# Patient Record
Sex: Female | Born: 1970
Health system: Southern US, Community
[De-identification: ages and names within clinical notes are randomized; demographics above are authoritative.]

## PROBLEM LIST (undated history)

## (undated) DIAGNOSIS — D219 Benign neoplasm of connective and other soft tissue, unspecified: Secondary | ICD-10-CM

## (undated) DIAGNOSIS — D649 Anemia, unspecified: Secondary | ICD-10-CM

## (undated) DIAGNOSIS — F32A Depression, unspecified: Secondary | ICD-10-CM

## (undated) DIAGNOSIS — R51 Headache: Secondary | ICD-10-CM

## (undated) DIAGNOSIS — F419 Anxiety disorder, unspecified: Secondary | ICD-10-CM

## (undated) DIAGNOSIS — I10 Essential (primary) hypertension: Secondary | ICD-10-CM

## (undated) DIAGNOSIS — L509 Urticaria, unspecified: Secondary | ICD-10-CM

## (undated) DIAGNOSIS — K219 Gastro-esophageal reflux disease without esophagitis: Secondary | ICD-10-CM

## (undated) DIAGNOSIS — Z9889 Other specified postprocedural states: Secondary | ICD-10-CM

## (undated) DIAGNOSIS — F329 Major depressive disorder, single episode, unspecified: Secondary | ICD-10-CM

## (undated) DIAGNOSIS — T7840XA Allergy, unspecified, initial encounter: Secondary | ICD-10-CM

## (undated) HISTORY — DX: Allergy, unspecified, initial encounter: T78.40XA

## (undated) HISTORY — DX: Urticaria, unspecified: L50.9

## (undated) HISTORY — DX: Major depressive disorder, single episode, unspecified: F32.9

## (undated) HISTORY — PX: WISDOM TOOTH EXTRACTION: SHX21

## (undated) HISTORY — DX: Depression, unspecified: F32.A

## (undated) HISTORY — PX: ABDOMINAL HYSTERECTOMY: SHX81

## (undated) HISTORY — DX: Other specified postprocedural states: Z98.890

---

## 1990-05-11 DIAGNOSIS — Z9889 Other specified postprocedural states: Secondary | ICD-10-CM

## 1990-05-11 HISTORY — DX: Other specified postprocedural states: Z98.890

## 1995-05-12 HISTORY — PX: TUBAL LIGATION: SHX77

## 1996-05-11 HISTORY — PX: LEEP: SHX91

## 2000-08-01 ENCOUNTER — Emergency Department (HOSPITAL_COMMUNITY): Admission: EM | Admit: 2000-08-01 | Discharge: 2000-08-01 | Payer: Self-pay | Admitting: Emergency Medicine

## 2000-08-01 ENCOUNTER — Encounter: Payer: Self-pay | Admitting: Emergency Medicine

## 2000-09-16 ENCOUNTER — Emergency Department (HOSPITAL_COMMUNITY): Admission: EM | Admit: 2000-09-16 | Discharge: 2000-09-16 | Payer: Self-pay | Admitting: Emergency Medicine

## 2000-09-16 ENCOUNTER — Encounter: Payer: Self-pay | Admitting: Emergency Medicine

## 2001-03-01 ENCOUNTER — Encounter: Payer: Self-pay | Admitting: Obstetrics and Gynecology

## 2001-03-01 ENCOUNTER — Ambulatory Visit (HOSPITAL_COMMUNITY): Admission: RE | Admit: 2001-03-01 | Discharge: 2001-03-01 | Payer: Self-pay | Admitting: Obstetrics and Gynecology

## 2001-07-19 ENCOUNTER — Other Ambulatory Visit: Admission: RE | Admit: 2001-07-19 | Discharge: 2001-07-19 | Payer: Self-pay | Admitting: Obstetrics and Gynecology

## 2004-03-17 ENCOUNTER — Other Ambulatory Visit: Admission: RE | Admit: 2004-03-17 | Discharge: 2004-03-17 | Payer: Self-pay | Admitting: Obstetrics and Gynecology

## 2006-11-26 ENCOUNTER — Inpatient Hospital Stay (HOSPITAL_COMMUNITY): Admission: AD | Admit: 2006-11-26 | Discharge: 2006-11-26 | Payer: Self-pay | Admitting: Gynecology

## 2006-12-08 ENCOUNTER — Emergency Department (HOSPITAL_COMMUNITY): Admission: EM | Admit: 2006-12-08 | Discharge: 2006-12-08 | Payer: Self-pay | Admitting: Emergency Medicine

## 2010-05-11 DIAGNOSIS — I10 Essential (primary) hypertension: Secondary | ICD-10-CM

## 2010-05-11 HISTORY — DX: Essential (primary) hypertension: I10

## 2011-02-23 LAB — WET PREP, GENITAL
Trich, Wet Prep: NONE SEEN
Yeast Wet Prep HPF POC: NONE SEEN

## 2011-02-23 LAB — URINALYSIS, ROUTINE W REFLEX MICROSCOPIC
Bilirubin Urine: NEGATIVE
Glucose, UA: NEGATIVE
Hgb urine dipstick: NEGATIVE
Ketones, ur: NEGATIVE
Nitrite: NEGATIVE
Protein, ur: NEGATIVE
Specific Gravity, Urine: >1.03 — ABNORMAL HIGH
Urobilinogen, UA: 0.2
pH: 5.5

## 2011-02-23 LAB — GC/CHLAMYDIA PROBE AMP, GENITAL
Chlamydia, DNA Probe: NEGATIVE
GC Probe Amp, Genital: NEGATIVE

## 2011-09-09 LAB — HM PAP SMEAR

## 2011-10-29 ENCOUNTER — Ambulatory Visit (INDEPENDENT_AMBULATORY_CARE_PROVIDER_SITE_OTHER): Payer: 59 | Admitting: Obstetrics & Gynecology

## 2011-10-29 ENCOUNTER — Other Ambulatory Visit (HOSPITAL_COMMUNITY)
Admission: RE | Admit: 2011-10-29 | Discharge: 2011-10-29 | Disposition: A | Discharge status: Home / Self Care | Payer: 59 | Source: Ambulatory Visit | Attending: Obstetrics & Gynecology | Admitting: Obstetrics & Gynecology

## 2011-10-29 ENCOUNTER — Encounter: Payer: Self-pay | Admitting: Obstetrics & Gynecology

## 2011-10-29 VITALS — BP 137/80 | HR 86 | Temp 97.0°F | Ht 64.0 in | Wt 188.9 lb

## 2011-10-29 DIAGNOSIS — N938 Other specified abnormal uterine and vaginal bleeding: Secondary | ICD-10-CM | POA: Insufficient documentation

## 2011-10-29 DIAGNOSIS — Z01419 Encounter for gynecological examination (general) (routine) without abnormal findings: Secondary | ICD-10-CM

## 2011-10-29 DIAGNOSIS — N920 Excessive and frequent menstruation with regular cycle: Secondary | ICD-10-CM | POA: Insufficient documentation

## 2011-10-29 DIAGNOSIS — A499 Bacterial infection, unspecified: Secondary | ICD-10-CM

## 2011-10-29 DIAGNOSIS — D259 Leiomyoma of uterus, unspecified: Secondary | ICD-10-CM

## 2011-10-29 DIAGNOSIS — I10 Essential (primary) hypertension: Secondary | ICD-10-CM | POA: Insufficient documentation

## 2011-10-29 DIAGNOSIS — N949 Unspecified condition associated with female genital organs and menstrual cycle: Secondary | ICD-10-CM | POA: Insufficient documentation

## 2011-10-29 DIAGNOSIS — IMO0002 Reserved for concepts with insufficient information to code with codable children: Secondary | ICD-10-CM

## 2011-10-29 DIAGNOSIS — D219 Benign neoplasm of connective and other soft tissue, unspecified: Secondary | ICD-10-CM | POA: Insufficient documentation

## 2011-10-29 DIAGNOSIS — N760 Acute vaginitis: Secondary | ICD-10-CM

## 2011-10-29 DIAGNOSIS — B9689 Other specified bacterial agents as the cause of diseases classified elsewhere: Secondary | ICD-10-CM

## 2011-10-29 MED ORDER — IBUPROFEN 200 MG PO TABS
800.0000 mg | ORAL_TABLET | Freq: Once | ORAL | Status: AC
  Administered 2011-10-29: 800 mg via ORAL

## 2011-10-29 MED ORDER — DICLOFENAC SODIUM 75 MG PO TBEC: 75.0000 mg | DELAYED_RELEASE_TABLET | Freq: Two times a day (BID) | ORAL | Status: DC

## 2011-10-29 MED ORDER — MEDROXYPROGESTERONE ACETATE 10 MG PO TABS: 20.0000 mg | ORAL_TABLET | Freq: Every day | ORAL | Status: DC

## 2011-10-29 NOTE — Progress Notes (Signed)
History:  41 y.o. Abigail [name] (SVD x 2) here today for evaluation and management of menorrhagia and fibroids. She was referred from [facility] and had an annual exam there on 09/25/11.  She had a normal pap smear, exam revealed a 12-14 sized enlarged uterus.  Hgb was noted to be 8.3.  Menses cone q 28-30 days, flow x 5 days, heavier the first two days requiring changing super pad/tampon every 1.5 -2 hours.  Associated with moderate cramping, bloating, passage of large blood clots.  Patient also reports occasional dyspareunia. Patient reports that she has discussed HTA or other surgical management in the past.  History of BTL in 1997, LEEP in 1992 with normal subsequent pap smears.   The following portions of the patient's history were reviewed and updated as appropriate: allergies, current medications, past family history, past medical history, past social history, past surgical history and problem list.  Review of Systems:  Pertinent items are noted in HPI.  Objective:  Physical Exam Blood pressure 137/80, pulse 86, temperature 97 F (36.1 C), temperature source Oral, height 5\' Abigail"  (1.626 m), weight 188 lb 14.Abigail oz (85.684 kg), last menstrual period 10/10/2011. Gen: NAD Abd: Soft,obese, nontender and nondistended Pelvic: Normal appearing external genitalia; normal appearing vaginal mucosa and cervix.  Normal discharge.  16 week sized uterus, no other palpable masses, no uterine or adnexal tenderness  ENDOMETRIAL BIOPSY     The indications for endometrial biopsy were reviewed.   Risks of the biopsy including cramping, bleeding, infection, uterine perforation, inadequate specimen and need for additional procedures  were discussed. The patient states she understands and agrees to undergo procedure today. Consent was signed. Time out was performed. Urine HCG was negative. A sterile speculum was placed in the patient's vagina and the cervix was prepped with Betadine. A single-toothed  tenaculum was placed on the anterior lip of the cervix to stabilize it. The 3 mm pipelle was introduced into the endometrial cavity with difficulty to a depth of 6 cm, unable to pass pipelle further due to resistance from intrauterine obstruction/lesion.  Scant amount of tissue was obtained after 3 passes and sent to pathology. The instruments were removed from the patient's vagina. Minimal bleeding from the cervix was noted. The patient tolerated the procedure well. Routine post-procedure instructions were given to the patient. The patient will follow up to review the results and for further management.    Assessment & Plan:  Menorrhagia and fibroids, s/p endometrial biopsy which is likely inadequate. Will follow up biopsy results, pelvic ultrasound ordered.  Will continue iron therapy, Megace and Diclofenac ordered.When patient returns, will discuss results and further evaluation.  Not a candidate for ablation; if she want surgery, may need a hysterectomy.  If she wants to avoid surgery, will discuss Depo Lupron or oral progesterone, or other IR procedures for which she may be referred to tertiary institutions.

## 2011-10-29 NOTE — Patient Instructions (Signed)

## 2011-10-29 NOTE — Addendum Note (Signed)
Addended by: Jennette Kettle on: 10/29/2011 02:46 PM   Modules accepted: Orders

## 2011-11-04 ENCOUNTER — Ambulatory Visit (HOSPITAL_COMMUNITY)
Admission: RE | Admit: 2011-11-04 | Discharge: 2011-11-04 | Disposition: A | Discharge status: Home / Self Care | Payer: 59 | Source: Ambulatory Visit | Attending: Obstetrics & Gynecology | Admitting: Obstetrics & Gynecology

## 2011-11-04 DIAGNOSIS — D250 Submucous leiomyoma of uterus: Secondary | ICD-10-CM | POA: Insufficient documentation

## 2011-11-04 DIAGNOSIS — N920 Excessive and frequent menstruation with regular cycle: Secondary | ICD-10-CM

## 2011-11-04 DIAGNOSIS — N949 Unspecified condition associated with female genital organs and menstrual cycle: Secondary | ICD-10-CM | POA: Insufficient documentation

## 2011-11-04 DIAGNOSIS — D219 Benign neoplasm of connective and other soft tissue, unspecified: Secondary | ICD-10-CM

## 2011-11-04 DIAGNOSIS — D252 Subserosal leiomyoma of uterus: Secondary | ICD-10-CM | POA: Insufficient documentation

## 2011-11-05 ENCOUNTER — Telehealth: Payer: Self-pay | Admitting: *Deleted

## 2011-11-05 NOTE — Telephone Encounter (Signed)
Abigail [name] called and left a message stating she had a question about the provera the doctor put her on," how am I supposed to take it?". Called Abigail [name] back and verified she is supposed to take 2 tablets by mouth daily and has 2 refills- hopefully this will help control her bleeding. Verified patient is aware of her follow up appt 7/12 and she can report to doctor how her bleeding is doing and if she still wants her to continue this dose. Patient voices understanding.

## 2011-11-10 ENCOUNTER — Telehealth: Payer: Self-pay | Admitting: *Deleted

## 2011-11-10 NOTE — Telephone Encounter (Signed)
Pt left message stating that she was in office and had Bx about 2 wks ago. She is still having problems- pain and bleeding. Her cycle started on 11/04/11. She has questions.

## 2011-11-10 NOTE — Telephone Encounter (Signed)
Called pt and discussed her concerns.  She states that she has been taking the diclofenac for pain but does not always take it twice daily- if she only takes it once, the pain returns. I advised pt to take the medication twice daily until she returns for her next appt on 7/12.  I explained that she needs to keep the level of medication constant in her system.  Pt also expressed concern that her period started 6/26 and has not stopped. The bleeding is not heavy and is sometimes bright red and @ other times it is dark.  I explained that this is not a problem @ this time. She should keep track of the bleeding pattern and discuss further @ her next visit. I advised pt to go to MAU of her bleeding becomes >1 pad/hr x3 consecutive hours. Pt confirmed that she is taking her iron as prescribed. Pt verbalized understanding of all instructions and advice.

## 2011-11-20 ENCOUNTER — Ambulatory Visit (INDEPENDENT_AMBULATORY_CARE_PROVIDER_SITE_OTHER): Payer: 59 | Admitting: Obstetrics & Gynecology

## 2011-11-20 ENCOUNTER — Encounter: Payer: Self-pay | Admitting: Obstetrics & Gynecology

## 2011-11-20 VITALS — BP 139/88 | HR 83 | Temp 98.3°F | Ht 64.0 in | Wt 188.1 lb

## 2011-11-20 DIAGNOSIS — D259 Leiomyoma of uterus, unspecified: Secondary | ICD-10-CM

## 2011-11-20 DIAGNOSIS — Z01818 Encounter for other preprocedural examination: Secondary | ICD-10-CM

## 2011-11-20 DIAGNOSIS — D219 Benign neoplasm of connective and other soft tissue, unspecified: Secondary | ICD-10-CM

## 2011-11-20 DIAGNOSIS — N920 Excessive and frequent menstruation with regular cycle: Secondary | ICD-10-CM

## 2011-11-20 NOTE — Progress Notes (Signed)
History:   41 y.o. [id number] (SVD x 2) here today for management of menorrhagia and fibroids. She was referred from [facility] and had an annual exam there on 09/25/11. She had a normal pap smear, exam revealed a 12-14 sized enlarged uterus. Hgb was noted to be 8.3. Menses cone q 28-30 days, flow x 5 days, heavier the first two days requiring changing super pad/tampon every 1.5 -2 hours. Associated with moderate cramping, bloating, passage of large blood clots. Patient also reports occasional dyspareunia. Patient reports that she has discussed surgical management in the past. History of BTL in 1997, LEEP in 1992 with normal subsequent pap smears.  Minimal bleeding on Provera since it was prescribed last visit. Patient desires hysterectomy.  The following portions of the patient's history were reviewed and updated as appropriate: allergies, current medications, past family history, past medical history, past social history, past surgical history and problem list.   Review of Systems:   Pertinent items are noted in HPI.   Objective:   Physical Exam  Blood pressure 139/88, pulse 83, temperature 98.3 F (36.8 C), temperature source Oral, height 5\' 4"  (1.626 m), weight 188 lb 1.6 oz (85.322 kg), last menstrual period 10/10/2011. Gen: NAD  Abd: Soft,obese, nontender and nondistended  Pelvic: Deferred  10/29/2011 Endometrium, biopsy - BENIGN POLYPOID SECRETORY PHASE ENDOMETRIUM. - NEGATIVE FOR HYPERPLASIA OR MALIGNANCY. - POLYPOID BENIGN ENDOCERVICAL MUCOSA. - SCANT DETACHED FRAGMENTS OF SQUAMOUS EPITHELIUM; NEGATIVE FOR INTRAEPITHELIAL MALIGNANCY. - FRAGMENTS OF BENIGN SMOOTH MUSCLE.  11/04/2011  TRANSABDOMINAL AND TRANSVAGINAL ULTRASOUND OF PELVIS  Clinical Data: Menorrhagia.  Fibroids.  Pelvic pain.  LMP 11/04/2011     Comparison:  All the  Findings: Uterus:  15.7 x 3.5 x 6.2 cm.  At least four separate fibroids are seen which are measurable, and these range in size from 2.4 cm to  6.0 cm in maximum diameter.  The largest is subserosal in location, extending from the left upper uterine corpus, measuring 6.0 x 5.4 x 4.7 cm. Two smaller fibroids, which are located in the anterior upper uterine body measuring 4.0 cm and in the posterior corpus measuring 2.4 cm, have partial submucosal components which indent the endometrium.  Endometrium: Double layer thickness measures 9 mm transvaginally. Normal trilayered appearance.  Right ovary: 3.1 x 1.8 x 2.7 cm.  Normal appearance.  Left ovary: 2.8 x 2.0 x 2.0 cm.  Normal appearance.  Other Findings:  No free fluid  IMPRESSION:  1.  Multiple uterine fibroids measuring up to 6 cm in diameter.  At least two fibroids have partial submucosal components.  Pelvic MRI without and with contrast may be helpful for surgical planning purposes. 2.  Normal ovaries.  No adnexal mass or free fluid identified.  Original Report Authenticated By: Danae Orleans, M.D.   Assessment & Plan:   Menorrhagia and fibroids, benign endometrial biopsy.  Patient desires definitive management with hysterectomy.  I proposed doing a robotic-assisted total hysterectomy (RATH) and prophylactic bilateral salpingectomy.  No indication for oophorectomy. Surgical menopause could have major health risks such as loss of ovarian hormones that could lead to vasomotor symptoms and osteoporosis, increased risk of cardiovascular disease, and procedure-related complications.  However, patient was counseled regarding a prophylactic bilateral salpingectomy given that a growing body of knowledge reveals that the majority of cases of high grade serous "ovarian" cancer actually are fallopian tube cancers.The precursor lesions are though to arise from the fimbriated end of the fallopian tube and the cancer can have metastases from this  primary lesion; and removal of fallopian tubes do not result in any known hormonal imbalance.     Patient does not want robotic assisted surgery; wants traditional open  supracervical hysterectomy and prophylactic bilateral salpingectomy.  I tried to convince her to choose the minimally invasive option, she was not interested. The risks of surgery were discussed in detail with the patient including but not limited to: bleeding which may require transfusion or reoperation; infection which may require antibiotics; injury to bowel, bladder, ureters or other surrounding organs; need for additional procedures including laparotomy; thromboembolic phenomenon, incisional problems and other postoperative/anesthesia complications.  Patient was also advised that she will remain in house for 1-2 days; and expected recovery time after a hysterectomy is 6-8 weeks.  Likelihood of success in alleviating the patient's symptoms was discussed. Routine postoperative instructions will be reviewed with the patient and her family in detail after surgery.  She was told that she will be contacted by our surgical scheduler regarding the time and date of her surgery; routine preoperative instructions of having nothing to eat or drink after midnight on the day prior to surgery and also coming to the hospital 1 1/2 hours prior to her time of surgery were also emphasized.  She was told she may be called for a preoperative appointment about a week prior to surgery and will be given further preoperative instructions at that visit. Patient also declined Depo Lupron.  In the meantime, she will continue iron therapy, Megace and Diclofenac; bleeding precautions were reviewed. Printed patient education handouts about the procedure was given to the patient to review at home.

## 2011-11-20 NOTE — Patient Instructions (Signed)
Hysterectomy Information  A hysterectomy is a procedure where your uterus is surgically removed. It will no longer be possible to have menstrual periods or to become pregnant. The tubes and ovaries can be removed (bilateral salpingo-oopherectomy) during this surgery as well.  REASONS FOR A HYSTERECTOMY  Persistent, abnormal bleeding.   Lasting (chronic) pelvic pain or infection.   The lining of the uterus (endometrium) starts growing outside the uterus (endometriosis).   The endometrium starts growing in the muscle of the uterus (adenomyosis).   The uterus falls down into the vagina (pelvic organ prolapse).   Symptomatic uterine fibroids.   Precancerous cells.   Cervical cancer or uterine cancer.  TYPES OF HYSTERECTOMIES  Supracervical hysterectomy. This type removes the top part of the uterus, but not the cervix.   Total hysterectomy. This type removes the uterus and cervix.   Radical hysterectomy. This type removes the uterus, cervix, and the fibrous tissue that holds the uterus in place in the pelvis (parametrium).  WAYS A HYSTERECTOMY CAN BE PERFORMED  Abdominal hysterectomy. A large surgical cut (incision) is made in the abdomen. The uterus is removed through this incision.   Vaginal hysterectomy. An incision is made in the vagina. The uterus is removed through this incision. There are no abdominal incisions.   Conventional laparoscopic hysterectomy. A thin, lighted tube with a camera (laparoscope) is inserted into 3 or 4 small incisions in the abdomen. The uterus is cut into small pieces. The small pieces are removed through the incisions, or they are removed through the vagina.   Laparoscopic assisted vaginal hysterectomy (LAVH). Three or four small incisions are made in the abdomen. Part of the surgery is performed laparoscopically and part vaginally. The uterus is removed through the vagina.   Robot-assisted laparoscopic hysterectomy. A laparoscope is inserted into 3 or 4  small incisions in the abdomen. A computer-controlled device is used to give the surgeon a 3D image. This allows for more precise movements of surgical instruments. The uterus is cut into small pieces and removed through the incisions or removed through the vagina.  RISKS OF HYSTERECTOMY   Bleeding and risk of blood transfusion. Tell your caregiver if you do not want to receive any blood products.   Blood clots in the legs or lung.   Infection.   Injury to surrounding organs.   Anesthesia problems or side effects.   Conversion to an abdominal hysterectomy.  WHAT TO EXPECT AFTER A HYSTERECTOMY  You will be given pain medicine.   You will need to have someone with you for the first 3 to 5 days after you go home.   You will need to follow up with your surgeon in 2 to 4 weeks after surgery to evaluate your progress.   You may have early menopause symptoms like hot flashes, night sweats, and insomnia.   If you had a hysterectomy for a problem that was not a cancer or a condition that could lead to cancer, then you no longer need Pap tests. However, even if you no longer need a Pap test, a regular exam is a good idea to make sure no other problems are starting.  Document Released: 10/21/2000 Document Revised: 04/16/2011 Document Reviewed: 12/06/2010 [facility] Patient Information 2012 [place], [place].  Supracervical Hysterectomy A supracervical hysterectomy is minimally invasive surgery to remove the top part of the uterus, but not the cervix. This surgery can be performed by making a large cut (incision) in the abdomen.  Your fallopian tubes and ovaries can be  removed (bilateral salpingo-oopherectomy) during this surgery as well.  You will not have menstrual periods or be able to get pregnant after having this surgery.  Benefits of surgery include:  Less pain.   Less risk of blood loss.   Less risk of infection.   Quicker return to normal activities.   Usually a 2 night stay in the  hospital.   Overall patient satisfaction.  LET YOUR CAREGIVER KNOW ABOUT:  Any history of abnormal Pap tests.   Allergies to food or medicine.   Medicines taken, including vitamins, herbs, eyedrops, over-the-counter medicines, and creams.   Use of steroids (by mouth or creams).   Previous problems with anesthetics or numbing medicines.   History of bleeding problems or blood clots.   Previous surgery.   Other health problems, including diabetes and kidney problems.   Any infections or colds you may have developed.   Symptoms of irregular or heavy periods, weight loss, or urinary or bowel changes.  RISKS AND COMPLICATIONS   Bleeding.   Blood clots in the legs or lung.   Infection.   Injury to surrounding organs.   Problems with anesthesia.   Risk of conversion to an open abdominal incision.   Early menopause symptoms (hot flashes, night sweats, insomnia).   Additional surgery later to remove the cervix if you have problems with the cervix.  BEFORE THE PROCEDURE  Ask your caregiver about changing or stopping your regular medicines.   Do not take aspirin or blood thinners (anticoagulants) for 1 week before the surgery, or as told by your caregiver.   Do not eat or drink anything for 8 hours before the surgery, or as told by your caregiver.   Quit smoking if you smoke.   Arrange for a ride home after surgery and for someone to help you at home during recovery.  PROCEDURE   You will be given an antibiotic medicine.   An intravenous (IV) line will be placed in your arm. You will be given medicine to make you sleep (general anesthetic).   The uterus will be removed through the incision.   Your incisions will be closed.  AFTER THE PROCEDURE    You will be taken to the recovery area where a nurse will watch and check your progress. Once you are awake, stable, and taking fluids well, without other problems, you will return to your room.   You will be given pain  medicine while you are in the hospital and for when you go home.   Try to have someone with you for the first 3 to 5 days after you go home.   Follow up with your surgeon in 4 weeks after surgery to evaluate your progress.  Document Released: 10/14/2007 Document Revised: 04/16/2011 Document Reviewed: 12/12/2010 [facility] Patient Information 2012 [place], [place].

## 2011-12-02 ENCOUNTER — Telehealth: Payer: Self-pay | Admitting: *Deleted

## 2011-12-02 NOTE — Telephone Encounter (Signed)
Pt left message requesting information about her surgery date- she has not received a letter.  I called pt this morning and informed her that the procedure has been scheduled on 01/18/12 @ 1245. Her letter was sent out on 11/27/11 and should be arriving this week. Pt voiced understanding.

## 2011-12-15 ENCOUNTER — Telehealth: Payer: Self-pay | Admitting: Medical

## 2011-12-15 NOTE — Telephone Encounter (Signed)
Pt called stating that she had been put on prometrium approximately 1 1/2 months ago. She has since had cramping without a cycle and wants to be sure that the doctor knows that she hasn't had a cycle and would like confirmation that this is ok. She wants to be sure this is not a reason for concern.

## 2011-12-21 NOTE — Telephone Encounter (Signed)
Discussed with Dr. Erin Fulling. Pt is to expect some cramping without a cycle while on prometrium. She was advised that this may lessen the longer she is on the medication and also that some spotting could occur. The patient voiced understanding and did not have any further questions at this time.

## 2012-01-05 ENCOUNTER — Encounter (HOSPITAL_COMMUNITY): Payer: Self-pay | Admitting: Pharmacist

## 2012-01-13 ENCOUNTER — Encounter (HOSPITAL_COMMUNITY): Payer: Self-pay

## 2012-01-13 ENCOUNTER — Encounter (HOSPITAL_COMMUNITY)
Admission: RE | Admit: 2012-01-13 | Discharge: 2012-01-13 | Disposition: A | Discharge status: Home / Self Care | Payer: 59 | Source: Ambulatory Visit | Attending: Obstetrics & Gynecology | Admitting: Obstetrics & Gynecology

## 2012-01-13 ENCOUNTER — Telehealth: Payer: Self-pay | Admitting: Medical

## 2012-01-13 HISTORY — DX: Benign neoplasm of connective and other soft tissue, unspecified: D21.9

## 2012-01-13 HISTORY — DX: Anxiety disorder, unspecified: F41.9

## 2012-01-13 HISTORY — DX: Headache: R51

## 2012-01-13 HISTORY — DX: Anemia, unspecified: D64.9

## 2012-01-13 HISTORY — DX: Gastro-esophageal reflux disease without esophagitis: K21.9

## 2012-01-13 HISTORY — DX: Essential (primary) hypertension: I10

## 2012-01-13 LAB — CBC
HCT: 40.7 % (ref 36.0–46.0)
Hemoglobin: 12.5 g/dL (ref 12.0–15.0)
WBC: 5.9 10*3/uL (ref 4.0–10.5)

## 2012-01-13 LAB — COMPREHENSIVE METABOLIC PANEL
Albumin: 3.8 g/dL (ref 3.5–5.2)
Alkaline Phosphatase: 54 U/L (ref 39–117)
BUN: 11 mg/dL (ref 6–23)
Chloride: 104 mEq/L (ref 96–112)
GFR calc Af Amer: >90 mL/min (ref >90)
Glucose, Bld: 86 mg/dL (ref 70–99)
Potassium: 3.5 mEq/L (ref 3.5–5.1)
Total Bilirubin: 0.1 mg/dL — ABNORMAL LOW (ref 0.3–1.2)

## 2012-01-13 LAB — SURGICAL PCR SCREEN: Staphylococcus aureus: NEGATIVE

## 2012-01-13 NOTE — Telephone Encounter (Signed)
Patient called stating that she has surgery scheduled for 01/18/12 and was sent pre-op information. Pre-op information did not include what needs to be completed before surgery and patient has been unable to reach anyone at the number provided on the surgical paperwork. Patient needs to know how to proceed so that pre-op is completed and she can still have surgery on 01/18/12.

## 2012-01-13 NOTE — Patient Instructions (Addendum)
   Your procedure is scheduled on: Monday September 9th  Enter through the Main Entrance of [facility] at: 11:30am Pick up the phone at the desk and dial [phone] and inform us of your arrival.  Please call this number if you have any problems the morning of surgery: [phone]  Remember: Do not eat food after midnight: Sunday You may have clear liquids until 9am Monday then nothing Please take your omeprazole as ordered evening prior to surgery, take your blood pressure medicine morning of surgery.   Do not wear jewelry, make-up, or FINGER nail polish No metal in your hair or on your body. Do not wear lotions, powders, perfumes or deodorant. Do not shave 48 hours prior to surgery. Do not bring valuables to the hospital.  Leave suitcase in the car. After Surgery it may be brought to your room. For patients being admitted to the hospital, checkout time is 11:00am the day of discharge.     Remember to use your hibiclens as instructed.Please shower with 1/2 bottle the evening before your surgery and the other 1/2 bottle the morning of surgery. Neck down avoiding private area.

## 2012-01-13 NOTE — Telephone Encounter (Signed)
Received a call from pre-op. Patient has appointment today at 2:15 pm for pre-op. I called and informed the patient of this appointment. She plans to keep it. She voiced no other questions or concerns.

## 2012-01-13 NOTE — Telephone Encounter (Signed)
Called patient and let her know that I am working on getting the answers for her for pre-op instructions. I have LM for Abigail [name] in pre-op scheduling and will call her back later today when I have more information. The patient voiced understanding.

## 2012-01-13 NOTE — Telephone Encounter (Signed)
Spoke with [facility], she said that the pre-op scheduler usually calls patients about a week in advance. I was given the extension for pre-op ([phone]) and LM at that number to call the clinic to discuss this patient and how I should advise her to proceed with pre-op appointment.

## 2012-01-17 MED ORDER — DEXTROSE 5 % IV SOLN
3.0000 g | INTRAVENOUS | Status: AC
  Administered 2012-01-18: 3 g via INTRAVENOUS
  Filled 2012-01-17: qty 3000

## 2012-01-18 ENCOUNTER — Encounter (HOSPITAL_COMMUNITY)
Admission: RE | Disposition: A | Discharge status: Home / Self Care | Payer: Self-pay | Source: Ambulatory Visit | Attending: Obstetrics & Gynecology

## 2012-01-18 ENCOUNTER — Encounter (HOSPITAL_COMMUNITY): Payer: Self-pay | Admitting: Anesthesiology

## 2012-01-18 ENCOUNTER — Encounter (HOSPITAL_COMMUNITY): Payer: Self-pay | Admitting: *Deleted

## 2012-01-18 ENCOUNTER — Inpatient Hospital Stay (HOSPITAL_COMMUNITY): Payer: 59 | Admitting: Anesthesiology

## 2012-01-18 ENCOUNTER — Inpatient Hospital Stay (HOSPITAL_COMMUNITY)
Admission: RE | Admit: 2012-01-18 | Discharge: 2012-01-20 | DRG: 743 | Disposition: A | Discharge status: Home / Self Care | Payer: 59 | Source: Ambulatory Visit | Attending: Obstetrics & Gynecology | Admitting: Obstetrics & Gynecology

## 2012-01-18 DIAGNOSIS — K219 Gastro-esophageal reflux disease without esophagitis: Secondary | ICD-10-CM | POA: Diagnosis present

## 2012-01-18 DIAGNOSIS — N920 Excessive and frequent menstruation with regular cycle: Secondary | ICD-10-CM | POA: Diagnosis present

## 2012-01-18 DIAGNOSIS — I10 Essential (primary) hypertension: Secondary | ICD-10-CM

## 2012-01-18 DIAGNOSIS — D250 Submucous leiomyoma of uterus: Secondary | ICD-10-CM | POA: Diagnosis present

## 2012-01-18 DIAGNOSIS — D259 Leiomyoma of uterus, unspecified: Secondary | ICD-10-CM

## 2012-01-18 DIAGNOSIS — Z90711 Acquired absence of uterus with remaining cervical stump: Secondary | ICD-10-CM | POA: Insufficient documentation

## 2012-01-18 DIAGNOSIS — D252 Subserosal leiomyoma of uterus: Secondary | ICD-10-CM | POA: Diagnosis present

## 2012-01-18 DIAGNOSIS — N8300 Follicular cyst of ovary, unspecified side: Secondary | ICD-10-CM | POA: Diagnosis present

## 2012-01-18 DIAGNOSIS — D649 Anemia, unspecified: Secondary | ICD-10-CM | POA: Diagnosis present

## 2012-01-18 DIAGNOSIS — D251 Intramural leiomyoma of uterus: Principal | ICD-10-CM | POA: Diagnosis present

## 2012-01-18 DIAGNOSIS — D219 Benign neoplasm of connective and other soft tissue, unspecified: Secondary | ICD-10-CM

## 2012-01-18 HISTORY — PX: SUPRACERVICAL ABDOMINAL HYSTERECTOMY: SHX5393

## 2012-01-18 LAB — ABO/RH: ABO/RH(D): A POS

## 2012-01-18 LAB — TYPE AND SCREEN
ABO/RH(D): A POS
Antibody Screen: NEGATIVE

## 2012-01-18 SURGERY — HYSTERECTOMY, SUPRACERVICAL, ABDOMINAL
Anesthesia: General | Site: Abdomen | Wound class: Clean Contaminated

## 2012-01-18 MED ORDER — DIPHENHYDRAMINE HCL 12.5 MG/5ML PO ELIX: 12.5000 mg | ORAL_SOLUTION | Freq: Four times a day (QID) | ORAL | Status: DC | PRN

## 2012-01-18 MED ORDER — PROPOFOL 10 MG/ML IV EMUL
INTRAVENOUS | Status: DC | PRN
  Administered 2012-01-18: 200 mg via INTRAVENOUS

## 2012-01-18 MED ORDER — LIDOCAINE HCL (CARDIAC) 20 MG/ML IV SOLN
INTRAVENOUS | Status: AC
  Filled 2012-01-18: qty 5

## 2012-01-18 MED ORDER — ONDANSETRON HCL 4 MG/2ML IJ SOLN
INTRAMUSCULAR | Status: AC
  Filled 2012-01-18: qty 2

## 2012-01-18 MED ORDER — ROCURONIUM BROMIDE 100 MG/10ML IV SOLN
INTRAVENOUS | Status: DC | PRN
Start: 2012-01-18 — End: 2012-01-18
  Administered 2012-01-18: 10 mg via INTRAVENOUS
  Administered 2012-01-18: 40 mg via INTRAVENOUS

## 2012-01-18 MED ORDER — 0.9 % SODIUM CHLORIDE (POUR BTL) OPTIME
TOPICAL | Status: DC | PRN
  Administered 2012-01-18 (×2): 1000 mL

## 2012-01-18 MED ORDER — NALOXONE HCL 0.4 MG/ML IJ SOLN: 0.4000 mg | INTRAMUSCULAR | Status: DC | PRN

## 2012-01-18 MED ORDER — GLYCOPYRROLATE 0.2 MG/ML IJ SOLN
INTRAMUSCULAR | Status: DC | PRN
  Administered 2012-01-18: 0.6 mg via INTRAVENOUS

## 2012-01-18 MED ORDER — MIDAZOLAM HCL 5 MG/5ML IJ SOLN
INTRAMUSCULAR | Status: DC | PRN
  Administered 2012-01-18: 2 mg via INTRAVENOUS

## 2012-01-18 MED ORDER — FENTANYL CITRATE 0.05 MG/ML IJ SOLN
INTRAMUSCULAR | Status: DC | PRN
  Administered 2012-01-18: 100 ug via INTRAVENOUS

## 2012-01-18 MED ORDER — LACTATED RINGERS IV SOLN
INTRAVENOUS | Status: DC
  Administered 2012-01-18 (×2): via INTRAVENOUS

## 2012-01-18 MED ORDER — SCOPOLAMINE 1 MG/3DAYS TD PT72
MEDICATED_PATCH | TRANSDERMAL | Status: AC
  Administered 2012-01-18: 1.5 mg via TRANSDERMAL
  Filled 2012-01-18: qty 1

## 2012-01-18 MED ORDER — DIPHENHYDRAMINE HCL 50 MG/ML IJ SOLN: 12.5000 mg | Freq: Four times a day (QID) | INTRAMUSCULAR | Status: DC | PRN

## 2012-01-18 MED ORDER — HYDROMORPHONE HCL PF 1 MG/ML IJ SOLN
INTRAMUSCULAR | Status: AC
  Filled 2012-01-18: qty 1

## 2012-01-18 MED ORDER — HYDROMORPHONE 0.3 MG/ML IV SOLN
INTRAVENOUS | Status: DC
  Administered 2012-01-18: 17:00:00 via INTRAVENOUS
  Administered 2012-01-18: 0.799 mg via INTRAVENOUS
  Administered 2012-01-19: 0.4 mg via INTRAVENOUS
  Administered 2012-01-19: 0.599 mg via INTRAVENOUS
  Filled 2012-01-18: qty 25

## 2012-01-18 MED ORDER — HYDROMORPHONE HCL PF 1 MG/ML IJ SOLN
INTRAMUSCULAR | Status: AC
  Administered 2012-01-18: 0.5 mg via INTRAVENOUS
  Filled 2012-01-18: qty 1

## 2012-01-18 MED ORDER — LACTATED RINGERS IV SOLN
INTRAVENOUS | Status: DC
  Administered 2012-01-18 – 2012-01-19 (×2): via INTRAVENOUS

## 2012-01-18 MED ORDER — ONDANSETRON HCL 4 MG PO TABS: 4.0000 mg | ORAL_TABLET | Freq: Four times a day (QID) | ORAL | Status: DC | PRN

## 2012-01-18 MED ORDER — LIDOCAINE HCL (CARDIAC) 20 MG/ML IV SOLN
INTRAVENOUS | Status: DC | PRN
  Administered 2012-01-18: 100 mg via INTRAVENOUS

## 2012-01-18 MED ORDER — NEOSTIGMINE METHYLSULFATE 1 MG/ML IJ SOLN
INTRAMUSCULAR | Status: AC
  Filled 2012-01-18: qty 10

## 2012-01-18 MED ORDER — DOCUSATE SODIUM 100 MG PO CAPS
100.0000 mg | ORAL_CAPSULE | Freq: Two times a day (BID) | ORAL | Status: DC
  Administered 2012-01-18 – 2012-01-19 (×3): 100 mg via ORAL
  Filled 2012-01-18 (×3): qty 1

## 2012-01-18 MED ORDER — ZOLPIDEM TARTRATE 5 MG PO TABS
5.0000 mg | ORAL_TABLET | Freq: Every evening | ORAL | Status: DC | PRN
  Administered 2012-01-18 – 2012-01-19 (×2): 5 mg via ORAL
  Filled 2012-01-18 (×2): qty 1

## 2012-01-18 MED ORDER — BUPIVACAINE HCL (PF) 0.25 % IJ SOLN
INTRAMUSCULAR | Status: AC
  Filled 2012-01-18: qty 30

## 2012-01-18 MED ORDER — HYDROMORPHONE HCL PF 1 MG/ML IJ SOLN
0.2500 mg | INTRAMUSCULAR | Status: DC | PRN
  Administered 2012-01-18 (×4): 0.5 mg via INTRAVENOUS

## 2012-01-18 MED ORDER — ONDANSETRON HCL 4 MG/2ML IJ SOLN: 4.0000 mg | Freq: Four times a day (QID) | INTRAMUSCULAR | Status: DC | PRN

## 2012-01-18 MED ORDER — HYDROMORPHONE HCL PF 1 MG/ML IJ SOLN
INTRAMUSCULAR | Status: DC | PRN
  Administered 2012-01-18: 1 mg via INTRAVENOUS

## 2012-01-18 MED ORDER — DEXAMETHASONE SODIUM PHOSPHATE 10 MG/ML IJ SOLN
INTRAMUSCULAR | Status: AC
  Filled 2012-01-18: qty 1

## 2012-01-18 MED ORDER — OXYCODONE-ACETAMINOPHEN 5-325 MG PO TABS: 1.0000 | ORAL_TABLET | ORAL | Status: DC | PRN

## 2012-01-18 MED ORDER — IBUPROFEN 600 MG PO TABS
600.0000 mg | ORAL_TABLET | Freq: Four times a day (QID) | ORAL | Status: DC | PRN
  Administered 2012-01-19 – 2012-01-20 (×4): 600 mg via ORAL
  Filled 2012-01-18 (×4): qty 1

## 2012-01-18 MED ORDER — MENTHOL 3 MG MT LOZG: 1.0000 | LOZENGE | OROMUCOSAL | Status: DC | PRN

## 2012-01-18 MED ORDER — SIMETHICONE 80 MG PO CHEW
80.0000 mg | CHEWABLE_TABLET | Freq: Four times a day (QID) | ORAL | Status: DC | PRN
  Administered 2012-01-19: 80 mg via ORAL

## 2012-01-18 MED ORDER — TRIAMTERENE-HCTZ 37.5-25 MG PO CAPS
1.0000 | ORAL_CAPSULE | Freq: Every day | ORAL | Status: DC
  Filled 2012-01-18 (×2): qty 1

## 2012-01-18 MED ORDER — SODIUM CHLORIDE 0.9 % IJ SOLN: 9.0000 mL | INTRAMUSCULAR | Status: DC | PRN

## 2012-01-18 MED ORDER — BUPIVACAINE HCL (PF) 0.25 % IJ SOLN
INTRAMUSCULAR | Status: DC | PRN
  Administered 2012-01-18: 30 mL

## 2012-01-18 MED ORDER — MIDAZOLAM HCL 2 MG/2ML IJ SOLN
INTRAMUSCULAR | Status: AC
  Filled 2012-01-18: qty 2

## 2012-01-18 MED ORDER — PANTOPRAZOLE SODIUM 40 MG PO TBEC
40.0000 mg | DELAYED_RELEASE_TABLET | Freq: Every day | ORAL | Status: DC
  Filled 2012-01-18 (×2): qty 1

## 2012-01-18 MED ORDER — MEPERIDINE HCL 25 MG/ML IJ SOLN: 6.2500 mg | INTRAMUSCULAR | Status: DC | PRN

## 2012-01-18 MED ORDER — GLYCOPYRROLATE 0.2 MG/ML IJ SOLN
INTRAMUSCULAR | Status: AC
  Filled 2012-01-18: qty 1

## 2012-01-18 MED ORDER — METOCLOPRAMIDE HCL 5 MG/ML IJ SOLN: 10.0000 mg | Freq: Once | INTRAMUSCULAR | Status: DC | PRN

## 2012-01-18 MED ORDER — KETOROLAC TROMETHAMINE 30 MG/ML IJ SOLN
INTRAMUSCULAR | Status: DC | PRN
  Administered 2012-01-18: 30 mg via INTRAVENOUS

## 2012-01-18 MED ORDER — NEOSTIGMINE METHYLSULFATE 1 MG/ML IJ SOLN
INTRAMUSCULAR | Status: DC | PRN
  Administered 2012-01-18: 3 mg via INTRAVENOUS

## 2012-01-18 MED ORDER — DEXAMETHASONE SODIUM PHOSPHATE 4 MG/ML IJ SOLN
INTRAMUSCULAR | Status: DC | PRN
  Administered 2012-01-18: 10 mg via INTRAVENOUS

## 2012-01-18 MED ORDER — ONDANSETRON HCL 4 MG/2ML IJ SOLN
INTRAMUSCULAR | Status: DC | PRN
  Administered 2012-01-18: 4 mg via INTRAVENOUS

## 2012-01-18 MED ORDER — FENTANYL CITRATE 0.05 MG/ML IJ SOLN
INTRAMUSCULAR | Status: AC
  Filled 2012-01-18: qty 5

## 2012-01-18 MED ORDER — SCOPOLAMINE 1 MG/3DAYS TD PT72
1.0000 | MEDICATED_PATCH | TRANSDERMAL | Status: DC
  Administered 2012-01-18: 1.5 mg via TRANSDERMAL

## 2012-01-18 MED ORDER — OMEPRAZOLE MAGNESIUM 20 MG PO TBEC: 20.0000 mg | DELAYED_RELEASE_TABLET | Freq: Every day | ORAL | Status: DC

## 2012-01-18 MED ORDER — PROPOFOL 10 MG/ML IV EMUL
INTRAVENOUS | Status: AC
  Filled 2012-01-18: qty 20

## 2012-01-18 MED ORDER — FLUTICASONE PROPIONATE 50 MCG/ACT NA SUSP
2.0000 | Freq: Every day | NASAL | Status: DC | PRN
  Filled 2012-01-18: qty 16

## 2012-01-18 MED ORDER — PANTOPRAZOLE SODIUM 40 MG PO TBEC: 40.0000 mg | DELAYED_RELEASE_TABLET | Freq: Every day | ORAL | Status: DC

## 2012-01-18 SURGICAL SUPPLY — 45 items
BARRIER ADHS 3X4 INTERCEED (GAUZE/BANDAGES/DRESSINGS) IMPLANT
BENZOIN TINCTURE PRP APPL 2/3 (GAUZE/BANDAGES/DRESSINGS) ×3 IMPLANT
CANISTER SUCTION 2500CC (MISCELLANEOUS) ×3 IMPLANT
CELLS DAT CNTRL 66122 CELL SVR (MISCELLANEOUS) IMPLANT
CHLORAPREP W/TINT 26ML (MISCELLANEOUS) ×3 IMPLANT
CLOTH BEACON ORANGE TIMEOUT ST (SAFETY) ×3 IMPLANT
CONT PATH 16OZ SNAP LID 3702 (MISCELLANEOUS) ×3 IMPLANT
DECANTER SPIKE VIAL GLASS SM (MISCELLANEOUS) IMPLANT
DRESSING TELFA 8X3 (GAUZE/BANDAGES/DRESSINGS) ×3 IMPLANT
GAUZE SPONGE 4X4 12PLY STRL LF (GAUZE/BANDAGES/DRESSINGS) ×3 IMPLANT
GAUZE SPONGE 4X4 16PLY XRAY LF (GAUZE/BANDAGES/DRESSINGS) ×3 IMPLANT
GLOVE BIO SURGEON STRL SZ7 (GLOVE) ×3 IMPLANT
GLOVE BIOGEL PI IND STRL 7.0 (GLOVE) ×4 IMPLANT
GLOVE BIOGEL PI INDICATOR 7.0 (GLOVE) ×2
GOWN PREVENTION PLUS LG XLONG (DISPOSABLE) ×9 IMPLANT
HEMOSTAT SURGICEL 4X8 (HEMOSTASIS) ×3 IMPLANT
NEEDLE HYPO 25X1 1.5 SAFETY (NEEDLE) ×3 IMPLANT
NS IRRIG 1000ML POUR BTL (IV SOLUTION) ×3 IMPLANT
PACK ABDOMINAL GYN (CUSTOM PROCEDURE TRAY) ×3 IMPLANT
PAD ABD 7.5X8 STRL (GAUZE/BANDAGES/DRESSINGS) ×3 IMPLANT
PAD OB MATERNITY 4.3X12.25 (PERSONAL CARE ITEMS) ×3 IMPLANT
RETRACTOR WND ALEXIS 25 LRG (MISCELLANEOUS) IMPLANT
RTRCTR WOUND ALEXIS 18CM MED (MISCELLANEOUS)
RTRCTR WOUND ALEXIS 25CM LRG (MISCELLANEOUS)
SPONGE GAUZE 4X4 12PLY (GAUZE/BANDAGES/DRESSINGS) ×3 IMPLANT
SPONGE LAP 18X18 X RAY DECT (DISPOSABLE) ×3 IMPLANT
STAPLER VISISTAT 35W (STAPLE) IMPLANT
STRIP CLOSURE SKIN 1/2X4 (GAUZE/BANDAGES/DRESSINGS) ×3 IMPLANT
SUT PDS AB 0 CT1 36 (SUTURE) ×3 IMPLANT
SUT PDS AB 0 CTX 60 (SUTURE) IMPLANT
SUT PLAIN 2 0 (SUTURE)
SUT PLAIN ABS 2-0 CT1 27XMFL (SUTURE) IMPLANT
SUT VIC AB 0 CT1 18XCR BRD8 (SUTURE) ×4 IMPLANT
SUT VIC AB 0 CT1 27 (SUTURE) ×3
SUT VIC AB 0 CT1 27XBRD ANBCTR (SUTURE) ×6 IMPLANT
SUT VIC AB 0 CT1 8-18 (SUTURE) ×2
SUT VIC AB 0 CTX 36 (SUTURE)
SUT VIC AB 0 CTX36XBRD ANBCTRL (SUTURE) IMPLANT
SUT VIC AB 4-0 KS 27 (SUTURE) ×3 IMPLANT
SUT VICRYL 0 TIES 12 18 (SUTURE) ×3 IMPLANT
SYR CONTROL 10ML LL (SYRINGE) ×3 IMPLANT
TAPE CLOTH SURG 4X10 WHT LF (GAUZE/BANDAGES/DRESSINGS) ×3 IMPLANT
TOWEL OR 17X24 6PK STRL BLUE (TOWEL DISPOSABLE) ×6 IMPLANT
TRAY FOLEY CATH 14FR (SET/KITS/TRAYS/PACK) ×3 IMPLANT
WATER STERILE IRR 1000ML POUR (IV SOLUTION) IMPLANT

## 2012-01-18 NOTE — Op Note (Signed)
Abigail [name] PROCEDURE DATE: 01/18/2012  PREOPERATIVE DIAGNOSIS:  Symptomatic fibroids, menorrhagia POSTOPERATIVE DIAGNOSIS:  Symptomatic fibroids, menorrhagia SURGEON:   Jaynie Collins, M.D. ASSISTANT: Elsie Lincoln, M.D. And Anselm Jungling, PA-S OPERATION:  Supracervical abdominal hysterectomy, Left Salpingoohorectomy, Right Salpingectomy ANESTHESIA:  General endotracheal.  INDICATIONS: The patient is a 41 y.o. [id number]  with the aforementioned diagnoses who desires definitive surgical management. On the preoperative visit, the risks, benefits, indications, and alternatives of the procedure were reviewed with the patient.  On the day of surgery, the risks of surgery were again discussed with the patient including but not limited to: bleeding which may require transfusion or reoperation; infection which may require antibiotics; injury to bowel, bladder, ureters or other surrounding organs; need for additional procedures; thromboembolic phenomenon, incisional problems and other postoperative/anesthesia complications. Written informed consent was obtained.    OPERATIVE FINDINGS: A 16 week size fibroid uterus with normal tubes and ovaries bilaterally.  ESTIMATED BLOOD LOSS: 200 ml FLUIDS:  1500 ml of Lactated Ringers URINE OUTPUT:  250 ml of clear yellow urine. SPECIMENS:  Uterus,  bilateral fallopian tube segments (patient had a tubal ligation), left ovary sent to pathology COMPLICATIONS:  None immediate.  DESCRIPTION OF PROCEDURE:  The patient received intravenous antibiotics and had sequential compression devices applied to her lower extremities while in the preoperative area.   She was taken to the operating room and placed under general anesthesia without difficulty.The abdomen and perineum were prepped and draped in a sterile manner, and she was placed in a dorsal supine position.  A Foley catheter was inserted into the bladder and attached to constant drainage. After an adequate timeout was  performed, a Pfannensteil skin incision was made. This incision was taken down to the fascia using electrocautery with care given to maintain good hemostasis. The fascia was incised in the midline and the fascial incision was then extended bilaterally using electrocautery without difficulty. The fascia was then dissected off the underlying rectus muscles using blunt and sharp dissection. The rectus muscles were split bluntly in the midline and the peritoneum entered sharply without complication. This peritoneal incision was then extended superiorly and inferiorly with care given to prevent bowel or bladder injury. Attention was then turned to the pelvis. The bowel was packed away with moist laparotomy sponges. The uterus at this point was noted to be mobilized and was delivered up out of the abdomen. The round ligaments on each side were clamped, suture ligated with 0 Vicryl, and transected with electrocautery allowing entry into the broad ligament. Of note, all sutures used in this procedure are 0 Vicryl unless otherwise noted. The anterior and posterior leaves of the broad ligament were separated, and the ureters were inspected to be safely away from the area of dissection bilaterally.  Adnexae were clamped on the patient's right side, cut, and doubly suture ligated. This procedure was repeated in an identical fashion on the left site allowing for both adnexa to remain in place.  Kelly clamps were placed on the mesosalpinx of the right fallopian tube, and the fallopian tube was excised.  The pedicle was then secured with a free tie.  A similar process was carried out on the left side, allowing for bilateral salpingectomy.   A bladder flap was then created.  The bladder was then bluntly dissected off the lower uterine segment and cervix with good hemostasis noted. The uterine arteries were then skeletonized bilaterally and then clamped, cut, and doubly suture ligated with care given to prevent ureteral injury.  The  uterus was then amputated across the lower uterine segment leaving the cervix intact. The specimen was sent to pathology. The cervical canal was coagulated, and the anterior and posterior peritoneal edges were then reapproximated in the midline over the cervical stump without complication. The pelvis was irrigated and some areas of bleeding were controlled with stitches and coagulation.  The left ovarian pedicle was noted to have significant bleeding not alleviated with these methods so the left infundibulopelvic ligament was clamped, cut, and doubly suture ligated resulting in left oophorectomy.  Hemostasis was reconfirmed at all pedicles and along the pelvic sidewall.  The ureters were inspected and noted to be peristalsing bilaterally.  All laparotomy sponges and instruments were removed from the abdomen. The rectus muscles were approximated with interrupted stiches. The fascia was also closed in a running fashion with 0 PDS. The subcutaneous layer was reapproximated with 2-0 plain gut. The skin was closed with a 4-0 Vicryl subcuticular stitch. Sponge, lap, needle, and instrument counts were correct times two. The patient was taken to the recovery area awake, extubated and in stable condition.  Jaynie Collins, M.D. 01/18/2012 3:32 PM

## 2012-01-18 NOTE — H&P (Signed)
Preoperative History and Physical  Abigail [name] is a 41 y.o. [id number] here for surgical management of menorrhagia and fibroids.  Proposed surgery: Abdominal supracervical hysterectomy and prophylactic bilateral salpingectomy.  Past Medical History  Diagnosis Date  . S/P LEEP 1998  . Hypertension     maxide  . Headache   . Anxiety   . GERD (gastroesophageal reflux disease)     omeprazole  . Fibroids   . Anemia     iron supp  . PONV (postoperative nausea and vomiting)     Past Surgical History  Procedure Date  . Tubal ligation   . Leep 1998   POb/GynH:  SVD x 2. History of BTL in 1997, LEEP in 1992 with normal subsequent pap smears.  Medications:  Prescriptions prior to admission  Medication Sig Dispense Refill  . diclofenac (VOLTAREN) 75 MG EC tablet Take 1 tablet (75 mg total) by mouth 2 (two) times daily with a meal.  60 tablet  2  . fluticasone (FLONASE) 50 MCG/ACT nasal spray Place 2 sprays into the nose daily as needed. For allergies      . medroxyPROGESTERone (PROVERA) 10 MG tablet Take 2 tablets (20 mg total) by mouth daily.  60 tablet  2  . omeprazole (PRILOSEC OTC) 20 MG tablet Take 20 mg by mouth daily as needed. For heartburn      . triamterene-hydrochlorothiazide (MAXZIDE-25) 37.5-25 MG per tablet Take 1 tablet by mouth daily.         Allergies  Allergen Reactions  . Tylox (Oxycodone-Acetaminophen) Itching    Social History  Substance Use Topics  . Smoking status: Never Smoker   . Smokeless tobacco: Never Used  . Alcohol Use: 0.5 oz/week    1 drink(s) per week     occasionally       Family History  Problem Relation Age of Onset  . Hypertension Father   . Diabetes Sister    Review of Systems: Noncontributory  PHYSICAL EXAM: Blood pressure 138/86, pulse 95, temperature 98.2 F (36.8 C), temperature source Oral, resp. rate 18, SpO2 97.00%. General appearance - alert, well appearing, and in no distress Chest - clear to auscultation, no wheezes,  rales or rhonchi, symmetric air entry Heart - normal rate and regular rhythm Abdomen - soft, nontender, nondistended, no organomegaly, able to palpate fibroid uterus Neurological - alert, oriented, normal speech, no focal findings or movement disorder noted Ext - No cyanosis, clubbing or edema, 2+ distal pulses bilaterally  Labs: Recent Results (from the past 336 hour(s))  SURGICAL PCR SCREEN   Collection Time   01/13/12  2:38 PM      Component Value Range   MRSA, PCR NEGATIVE  NEGATIVE   Staphylococcus aureus NEGATIVE  NEGATIVE  CBC   Collection Time   01/13/12  3:00 PM      Component Value Range   WBC 5.9  4.0 - 10.5 K/uL   RBC 4.89  3.87 - 5.11 MIL/uL   Hemoglobin 12.5  12.0 - 15.0 g/dL   HCT 45.4  09.8 - 11.9 %   MCV 83.2  78.0 - 100.0 fL   MCH 25.6 (*) 26.0 - 34.0 pg   MCHC 30.7  30.0 - 36.0 g/dL   RDW 14.7 (*) 82.9 - 56.2 %   Platelets 348  150 - 400 K/uL  COMPREHENSIVE METABOLIC PANEL   Collection Time   01/13/12  3:00 PM      Component Value Range   Sodium 138  135 - 145 mEq/L  Potassium 3.5  3.5 - 5.1 mEq/L   Chloride 104  96 - 112 mEq/L   CO2 26  19 - 32 mEq/L   Glucose, Bld 86  70 - 99 mg/dL   BUN 11  6 - 23 mg/dL   Creatinine, Ser 1.61  0.50 - 1.10 mg/dL   Calcium 9.5  8.4 - 09.6 mg/dL   Total Protein 7.9  6.0 - 8.3 g/dL   Albumin 3.8  3.5 - 5.2 g/dL   AST 26  0 - 37 U/L   ALT 21  0 - 35 U/L   Alkaline Phosphatase 54  39 - 117 U/L   Total Bilirubin 0.1 (*) 0.3 - 1.2 mg/dL   GFR calc non Af Amer >90  >90 mL/min   GFR calc Af Amer >90  >90 mL/min   10/29/2011 Endometrium, biopsy  - BENIGN POLYPOID SECRETORY PHASE ENDOMETRIUM.  - NEGATIVE FOR HYPERPLASIA OR MALIGNANCY.  - POLYPOID BENIGN ENDOCERVICAL MUCOSA.  - SCANT DETACHED FRAGMENTS OF SQUAMOUS EPITHELIUM; NEGATIVE FOR INTRAEPITHELIAL MALIGNANCY.  - FRAGMENTS OF BENIGN SMOOTH MUSCLE.   11/04/2011 TRANSABDOMINAL AND TRANSVAGINAL ULTRASOUND OF PELVIS Clinical Data: Menorrhagia. Fibroids. Pelvic pain. LMP  11/04/2011 Comparison: All the Findings: Uterus: 15.7 x 3.5 x 6.2 cm. At least four separate fibroids are seen which are measurable, and these range in size from 2.4 cm to 6.0 cm in maximum diameter. The largest is subserosal in location, extending from the left upper uterine corpus, measuring 6.0 x 5.4 x 4.7 cm. Two smaller fibroids, which are located in the anterior upper uterine body measuring 4.0 cm and in the posterior corpus measuring 2.4 cm, have partial submucosal components which indent the endometrium. Endometrium: Double layer thickness measures 9 mm transvaginally. Normal trilayered appearance. Right ovary: 3.1 x 1.8 x 2.7 cm. Normal appearance. Left ovary: 2.8 x 2.0 x 2.0 cm. Normal appearance. Other Findings: No free fluid IMPRESSION: 1. Multiple uterine fibroids measuring up to 6 cm in diameter. At least two fibroids have partial submucosal components. Pelvic MRI without and with contrast may be helpful for surgical planning purposes. 2. Normal ovaries. No adnexal mass or free fluid identified. Original Report Authenticated By: Danae Orleans, M.D.    Assessment: Patient Active Problem List  Diagnosis  . Menorrhagia  . Fibroids  . HTN (hypertension)    Plan: Patient desires surgical management with abdominal supracervical hysterectomy and prophylactic bilateral salpingectomy.   The risks of surgery were discussed in detail with the patient including but not limited to: bleeding which may require transfusion or reoperation; infection which may require antibiotics; injury to bowel, bladder, ureters or other surrounding organs; need for additional procedures including laparotomy; thromboembolic phenomenon, incisional problems and other postoperative/anesthesia complications. Patient was also advised that she will remain in house for 2 nights; and expected recovery time after a hysterectomy is 6-8 weeks. Likelihood of success in alleviating the patient's symptoms was discussed. Routine  postoperative instructions will be reviewed with the patient and her family in detail after surgery.  The patient concurred with the proposed plan, giving informed written consent for the surgery.  Patient has been NPO since last night she will remain NPO for procedure.  Anesthesia and OR aware.  Preoperative prophylactic antibiotics and SCDs ordered on call to the OR.  To OR when ready.  Jaynie Collins, M.D. 01/18/2012 12:46 PM

## 2012-01-18 NOTE — Anesthesia Procedure Notes (Signed)
Date/Time: 01/18/2012 1:15 PM Performed by: Isabella Bowens R Pre-anesthesia Checklist: Patient identified, Emergency Drugs available, Suction available, Timeout performed and Patient being monitored Patient Re-evaluated:Patient Re-evaluated prior to inductionOxygen Delivery Method: Circle system utilized Preoxygenation: Pre-oxygenation with 100% oxygen Intubation Type: IV induction Ventilation: Mask ventilation without difficulty Laryngoscope Size: Mac and 3 Grade View: Grade II Tube type: Oral Tube size: 7.0 mm Number of attempts: 1 Airway Equipment and Method: Stylet Placement Confirmation: ETT inserted through vocal cords under direct vision,  positive ETCO2 and breath sounds checked- equal and bilateral Secured at: 21 cm Tube secured with: Tape Dental Injury: Teeth and Oropharynx as per pre-operative assessment  Difficulty Due To: Difficulty was unanticipated

## 2012-01-18 NOTE — Anesthesia Postprocedure Evaluation (Signed)
Anesthesia Post Note  Patient: Abigail [name]  Procedure(s) Performed: Procedure(s) (LRB): HYSTERECTOMY SUPRACERVICAL ABDOMINAL (N/A) BILATERAL SALPINGECTOMY (Bilateral)  Anesthesia type: General  Patient location: PACU  Post pain: Pain level controlled  Post assessment: Post-op Vital signs reviewed  Last Vitals:  Filed Vitals:   01/18/12 1615  BP:   Pulse: 88  Temp: 37.1 C  Resp: 20    Post vital signs: Reviewed  Level of consciousness: sedated  Complications: No apparent anesthesia complicationsfj

## 2012-01-18 NOTE — Transfer of Care (Signed)
Immediate Anesthesia Transfer of Care Note  Patient: Abigail [name]  Procedure(s) Performed: Procedure(s) (LRB) with comments: HYSTERECTOMY SUPRACERVICAL ABDOMINAL (N/A) BILATERAL SALPINGECTOMY (Bilateral) - Left Oophorectomy.  Patient Location: PACU  Anesthesia Type: General  Level of Consciousness: awake, alert , oriented and patient cooperative  Airway & Oxygen Therapy: Patient Spontanous Breathing and Patient connected to nasal cannula oxygen  Post-op Assessment: Report given to PACU RN, Post -op Vital signs reviewed and stable and Patient moving all extremities X 4  Post vital signs: Reviewed and stable  Complications: No apparent anesthesia complications

## 2012-01-18 NOTE — Anesthesia Preprocedure Evaluation (Signed)
Anesthesia Evaluation    History of Anesthesia Complications (+) PONV  Airway Mallampati: II TM Distance: >3 FB Neck ROM: Full    Dental No notable dental hx. (+) Caps and Teeth Intact   Pulmonary neg pulmonary ROS,  breath sounds clear to auscultation  Pulmonary exam normal       Cardiovascular hypertension, Pt. on medications Rhythm:Regular Rate:Normal     Neuro/Psych  Headaches, negative psych ROS   GI/Hepatic Neg liver ROS, GERD-  Medicated and Controlled,  Endo/Other  negative endocrine ROS  Renal/GU negative Renal ROS  negative genitourinary   Musculoskeletal negative musculoskeletal ROS (+)   Abdominal (+) + obese,   Peds  Hematology negative hematology ROS (+)   Anesthesia Other Findings   Reproductive/Obstetrics Menorrhagia  Fibroid Uterus                           Anesthesia Physical Anesthesia Plan  ASA: II  Anesthesia Plan: General   Post-op Pain Management:    Induction: Intravenous  Airway Management Planned: Oral ETT  Additional Equipment:   Intra-op Plan:   Post-operative Plan: Extubation in OR  Informed Consent: I have reviewed the patients History and Physical, chart, labs and discussed the procedure including the risks, benefits and alternatives for the proposed anesthesia with the patient or authorized representative who has indicated his/her understanding and acceptance.   Dental advisory given  Plan Discussed with: CRNA, Anesthesiologist and Surgeon  Anesthesia Plan Comments:         Anesthesia Quick Evaluation

## 2012-01-19 ENCOUNTER — Encounter (HOSPITAL_COMMUNITY): Payer: Self-pay | Admitting: Obstetrics & Gynecology

## 2012-01-19 LAB — CBC
MCH: 26.4 pg (ref 26.0–34.0)
MCV: 82.6 fL (ref 78.0–100.0)
Platelets: 313 10*3/uL (ref 150–400)
RBC: 4.2 MIL/uL (ref 3.87–5.11)
RDW: 20.6 % — ABNORMAL HIGH (ref 11.5–15.5)

## 2012-01-19 MED ORDER — HYDROMORPHONE HCL 2 MG PO TABS
2.0000 mg | ORAL_TABLET | ORAL | Status: DC | PRN
  Administered 2012-01-19 – 2012-01-20 (×4): 2 mg via ORAL
  Filled 2012-01-19 (×4): qty 1

## 2012-01-19 NOTE — Anesthesia Postprocedure Evaluation (Signed)
  Anesthesia Post-op Note  Patient: Abigail [name]  Procedure(s) Performed: Procedure(s) (LRB) with comments: HYSTERECTOMY SUPRACERVICAL ABDOMINAL (N/A) BILATERAL SALPINGECTOMY (Bilateral) - Left Oophorectomy.  Patient Location: [facility]  Anesthesia Type: General  Level of Consciousness: sedated  Airway and Oxygen Therapy: Patient Spontanous Breathing  Post-op Pain: mild  Post-op Assessment: Post-op Vital signs reviewed  Post-op Vital Signs: Reviewed and stable  Complications: No apparent anesthesia complications

## 2012-01-19 NOTE — Addendum Note (Signed)
Addendum  created 01/19/12 1316 by Algis Greenhouse, CRNA   Modules edited:Notes Section

## 2012-01-19 NOTE — Progress Notes (Signed)
UR Chart review completed.  

## 2012-01-19 NOTE — Progress Notes (Signed)
1 Day Post-Op Procedure(s) (LRB): HYSTERECTOMY SUPRACERVICAL ABDOMINAL (N/A) BILATERAL SALPINGECTOMY (Bilateral) LEFT OOPHORECTOMY  Subjective: Patient reports tolerating PO and no problems voiding.  No flatus yet.  Ambulating.  Objective: I have reviewed patient's vital signs, intake and output, medications, labs and pathology.  General: alert and no distress Resp: clear to auscultation bilaterally Cardio: regular rate and rhythm GI: soft, non-tender; bowel sounds normal; no masses,  no organomegaly and incision: clean, dry, intact and no drainage present Extremities: extremities normal, atraumatic, no cyanosis or edema and Homans sign is negative, no sign of DVT Vaginal Bleeding: scant on pad   Lab Results  Component Value Date   WBC 10.1 01/19/2012   HGB 11.1* 01/19/2012   HCT 34.7* 01/19/2012   MCV 82.6 01/19/2012   PLT 313 01/19/2012  Preoperative Hemoglobin was 12.5  Pathology 01/18/12 Uterus and bilateral fallopian tubes, left ovary - ENDOMETRIUM: INACTIVE. NO EVIDENCE OF HYPERPLASIA OR CARCINOMA. - MYOMETRIUM: LEIOMYOMATA WITH DEGENERATIVE CHANGES. - BILATERAL FALLOPIAN TUBES: UNREMARKABLE. - LEFT OVARY: FOLLICULAR CYST. NO ENDOMETRIOSIS OR EVIDENCE OF MALIGNANCY.  Assessment: s/p Procedure(s) (LRB) with comments: HYSTERECTOMY SUPRACERVICAL ABDOMINAL (N/A) BILATERAL SALPINGECTOMY (Bilateral) - Left Oophorectomy.: stable, progressing well and tolerating diet  Plan: Advance diet Encourage ambulation Advance to PO medication Discontinue IV fluids Discharge home tomorrow if remains stable   LOS: 1 day    Amear Strojny A 01/19/2012, 2:15 PM

## 2012-01-20 MED ORDER — DOCUSATE SODIUM 100 MG PO CAPS: 100.0000 mg | ORAL_CAPSULE | Freq: Two times a day (BID) | ORAL | Status: DC | PRN

## 2012-01-20 MED ORDER — HYDROMORPHONE HCL 2 MG PO TABS: 1.0000 mg | ORAL_TABLET | ORAL | Status: DC | PRN

## 2012-01-20 MED ORDER — IBUPROFEN 600 MG PO TABS: 600.0000 mg | ORAL_TABLET | Freq: Four times a day (QID) | ORAL | Status: DC | PRN

## 2012-01-20 NOTE — Discharge Summary (Signed)
Gynecology Physician Discharge Summary  Patient ID: Abigail [name] MRN: [id number] DOB/AGE: Sep 30, 1970 41 y.o.  Admit date: 01/18/2012 Discharge date: 01/20/2012  Admission Diagnoses: Menorrhagia, symptomatic fibroids  Procedures: Procedure(s) (LRB): HYSTERECTOMY SUPRACERVICAL ABDOMINAL (N/A) BILATERAL SALPINGECTOMY (Bilateral) LEFT OOPHORECTOMY  Significant Diagnostic Studies: Preoperative hemoglobin 12.5 Lab Results  Component Value Date   HGB 11.1* 01/19/2012   Pathology 01/18/12  Uterus and bilateral fallopian tubes, left ovary  - ENDOMETRIUM: INACTIVE. NO EVIDENCE OF HYPERPLASIA OR CARCINOMA.  - MYOMETRIUM: LEIOMYOMATA WITH DEGENERATIVE CHANGES.  - BILATERAL FALLOPIAN TUBES: UNREMARKABLE.  - LEFT OVARY: FOLLICULAR CYST. NO ENDOMETRIOSIS OR EVIDENCE OF MALIGNANCY  Hospital Course:  Abigail [name] is a 41 y.o. No obstetric history on file.  admitted for scheduled surgery.  She underwent the procedures as mentioned above, her operation was uncomplicated. For further details about surgery, please refer to the operative report. Patient had an uncomplicated postoperative course. By time of discharge, her pain was controlled on oral pain medications; she was ambulating, voiding without difficulty, tolerating regular diet and passing flatus. She was deemed stable for discharge to home.   Discharge Exam: Blood pressure 138/83, pulse 77, temperature 98.2 F (36.8 C), temperature source Oral, resp. rate 16, height 5\' 5"  (1.651 m), weight 188 lb (85.276 kg), SpO2 98.00%. General appearance: alert and no distress Resp: clear to auscultation bilaterally Cardio: regular rate and rhythm GI: soft, non-tender; bowel sounds normal; no masses,  no organomegaly Extremities: extremities normal, atraumatic, no cyanosis or edema and Homans sign is negative, no sign of DVT Incision/Wound: clean, dry, intact with steristrips  Discharged Condition: stable  Disposition: 01-Home or Self Care        Discharge Orders    Future Appointments: Provider: Department: Dept Phone: Center:   02/11/2012 1:45 PM Tereso Newcomer, MD [facility] [phone] [facility]       Medication List     As of 01/20/2012  2:26 PM    STOP taking these medications         diclofenac 75 MG EC tablet   Commonly known as: VOLTAREN      medroxyPROGESTERone 10 MG tablet   Commonly known as: PROVERA      TAKE these medications         docusate sodium 100 MG capsule   Commonly known as: COLACE   Take 1 capsule (100 mg total) by mouth 2 (two) times daily as needed for constipation.      fluticasone 50 MCG/ACT nasal spray   Commonly known as: FLONASE   Place 2 sprays into the nose daily as needed. For allergies      HYDROmorphone 2 MG tablet   Commonly known as: DILAUDID   Take 0.5 tablets (1 mg total) by mouth every 4 (four) hours as needed.      ibuprofen 600 MG tablet   Commonly known as: ADVIL,MOTRIN   Take 1 tablet (600 mg total) by mouth every 6 (six) hours as needed (mild pain).      omeprazole 20 MG tablet   Commonly known as: PRILOSEC OTC   Take 20 mg by mouth daily as needed. For heartburn      triamterene-hydrochlorothiazide 37.5-25 MG per tablet   Commonly known as: MAXZIDE-25   Take 1 tablet by mouth daily.        Follow-up Information    Follow up with Tereso Newcomer, MD. On 02/11/2012. (1:45pm for postoperative appointment)    Contact information:   [address] [place] [address] [phone]  SignedJaynie Collins A 01/20/2012, 2:26 PM

## 2012-01-22 ENCOUNTER — Telehealth: Payer: Self-pay | Admitting: *Deleted

## 2012-01-22 NOTE — Telephone Encounter (Signed)
Pt left message stating she had hysterectomy on 01/18/12. She has not had a BM since that Mattia Osterman and wants to know what she can take. Please call back.

## 2012-01-23 ENCOUNTER — Emergency Department (HOSPITAL_COMMUNITY)
Admission: EM | Admit: 2012-01-23 | Discharge: 2012-01-23 | Disposition: A | Discharge status: Home / Self Care | Payer: 59 | Attending: Emergency Medicine | Admitting: Emergency Medicine

## 2012-01-23 ENCOUNTER — Emergency Department (HOSPITAL_COMMUNITY): Payer: 59

## 2012-01-23 ENCOUNTER — Encounter (HOSPITAL_COMMUNITY): Payer: Self-pay | Admitting: Family Medicine

## 2012-01-23 DIAGNOSIS — R51 Headache: Secondary | ICD-10-CM | POA: Insufficient documentation

## 2012-01-23 DIAGNOSIS — I10 Essential (primary) hypertension: Secondary | ICD-10-CM | POA: Insufficient documentation

## 2012-01-23 DIAGNOSIS — Z90710 Acquired absence of both cervix and uterus: Secondary | ICD-10-CM | POA: Insufficient documentation

## 2012-01-23 DIAGNOSIS — F411 Generalized anxiety disorder: Secondary | ICD-10-CM | POA: Insufficient documentation

## 2012-01-23 DIAGNOSIS — K219 Gastro-esophageal reflux disease without esophagitis: Secondary | ICD-10-CM | POA: Insufficient documentation

## 2012-01-23 LAB — CBC
MCH: 27.1 pg (ref 26.0–34.0)
MCHC: 32.7 g/dL (ref 30.0–36.0)
MCV: 83 fL (ref 78.0–100.0)
Platelets: 332 10*3/uL (ref 150–400)
RDW: 19.1 % — ABNORMAL HIGH (ref 11.5–15.5)
WBC: 8.6 10*3/uL (ref 4.0–10.5)

## 2012-01-23 LAB — BASIC METABOLIC PANEL
CO2: 28 mEq/L (ref 19–32)
Calcium: 9.2 mg/dL (ref 8.4–10.5)
Creatinine, Ser: 0.68 mg/dL (ref 0.50–1.10)

## 2012-01-23 MED ORDER — HYDROMORPHONE HCL PF 1 MG/ML IJ SOLN
1.0000 mg | Freq: Once | INTRAMUSCULAR | Status: AC
  Administered 2012-01-23: 1 mg via INTRAVENOUS
  Filled 2012-01-23: qty 1

## 2012-01-23 MED ORDER — SODIUM CHLORIDE 0.9 % IV SOLN
1000.0000 mL | Freq: Once | INTRAVENOUS | Status: AC
  Administered 2012-01-23: 1000 mL via INTRAVENOUS

## 2012-01-23 MED ORDER — SODIUM CHLORIDE 0.9 % IV SOLN
1000.0000 mL | INTRAVENOUS | Status: DC
  Administered 2012-01-23: 1000 mL via INTRAVENOUS

## 2012-01-23 MED ORDER — DIPHENHYDRAMINE HCL 50 MG/ML IJ SOLN
25.0000 mg | Freq: Once | INTRAMUSCULAR | Status: AC
  Administered 2012-01-23: 11:00:00 via INTRAVENOUS
  Filled 2012-01-23: qty 1

## 2012-01-23 MED ORDER — ONDANSETRON HCL 4 MG/2ML IJ SOLN
4.0000 mg | Freq: Once | INTRAMUSCULAR | Status: AC
  Administered 2012-01-23: 4 mg via INTRAVENOUS
  Filled 2012-01-23: qty 2

## 2012-01-23 MED ORDER — BUTALBITAL-APAP-CAFFEINE 50-325-40 MG PO TABS: 1.0000 | ORAL_TABLET | Freq: Four times a day (QID) | ORAL | Status: AC | PRN

## 2012-01-23 MED ORDER — METOCLOPRAMIDE HCL 5 MG/ML IJ SOLN
10.0000 mg | Freq: Once | INTRAMUSCULAR | Status: AC
  Administered 2012-01-23: 10 mg via INTRAVENOUS
  Filled 2012-01-23: qty 2

## 2012-01-23 NOTE — ED Provider Notes (Signed)
History     CSN: [id number]  Arrival date & time 01/23/12  4696   First MD Initiated Contact with Patient 01/23/12 1033      Chief Complaint  Patient presents with  . Headache    (Consider location/radiation/quality/duration/timing/severity/associated sxs/prior treatment) HPI The patient presents with concerns of a headache.  Notably, the patient has a history of headaches, and has previously been treated for migraines, though she is currently not taking any medication for these.  She notes that this headache began approximately 4 days ago, the day after a hysterectomy, performed for fibroids. Since onset the headache has been persistent, worsening, diffuse, incapacitating with photophobia.  She denies current vomiting, but states that she is persistently nauseous. She denies fevers, chills, diarrhea, states that she recently began having bowel movements again following her procedure, and was previously constipated. Denies visual changes, ataxia, unilateral weakness or dysesthesia. Past Medical History  Diagnosis Date  . S/P LEEP 1998  . Hypertension     maxide  . Headache   . Anxiety   . GERD (gastroesophageal reflux disease)     omeprazole  . Fibroids   . Anemia     iron supp  . PONV (postoperative nausea and vomiting)     Past Surgical History  Procedure Date  . Tubal ligation   . Leep 1998  . Supraclavical abdominal hysterectomy 01/18/2012    Procedure: HYSTERECTOMY SUPRACERVICAL ABDOMINAL;  Surgeon: Tereso Newcomer, MD;  Location: [facility];  Service: Gynecology;  Laterality: N/A;    Family History  Problem Relation Age of Onset  . Hypertension Father   . Diabetes Sister     History  Substance Use Topics  . Smoking status: Never Smoker   . Smokeless tobacco: Never Used  . Alcohol Use: 0.5 oz/week    1 drink(s) per week     occasi    OB History    Grav Para Term Preterm Abortions TAB SAB Ect Mult Living                  Review of Systems    Constitutional:       HPI  HENT:       HPI otherwise negative  Eyes: Negative.   Respiratory:       HPI, otherwise negative  Cardiovascular:       HPI, otherwise nmegative  Gastrointestinal: Negative for vomiting.  Genitourinary:       HPI, otherwise negative  Musculoskeletal:       HPI, otherwise negative  Skin: Negative.   Neurological: Negative for syncope.    Allergies  Tylox  Home Medications   Current Outpatient Rx  Name Route Sig Dispense Refill  . DOCUSATE SODIUM 100 MG PO CAPS Oral Take 100 mg by mouth 2 (two) times daily as needed. constipation    . ALLEGRA PO Oral Take 1 tablet by mouth daily as needed. allergies    . FLUTICASONE PROPIONATE 50 MCG/ACT NA SUSP Nasal Place 2 sprays into the nose daily as needed. For allergies    . HYDROMORPHONE HCL 2 MG PO TABS Oral Take 1 mg by mouth every 4 (four) hours as needed. for pain    . IBUPROFEN 600 MG PO TABS Oral Take 600 mg by mouth every 6 (six) hours as needed. For pain    . OMEPRAZOLE MAGNESIUM 20 MG PO TBEC Oral Take 20 mg by mouth daily as needed. For heartburn    . TRIAMTERENE-HCTZ 37.5-25 MG PO TABS Oral Take 1  tablet by mouth daily.      BP 141/90  Pulse 90  Temp 98.5 F (36.9 C)  Resp 18  SpO2 97%  LMP 01/13/2012  Physical Exam  Nursing note and vitals reviewed. Constitutional: She is oriented to person, place, and time. She appears well-developed and well-nourished. No distress.  HENT:  Head: Normocephalic and atraumatic.  Eyes: Conjunctivae normal and EOM are normal.  Cardiovascular: Normal rate and regular rhythm.   Pulmonary/Chest: Effort normal and breath sounds normal. No stridor. No respiratory distress.  Abdominal: She exhibits no distension.    Musculoskeletal: She exhibits no edema.  Neurological: She is alert and oriented to person, place, and time. No cranial nerve deficit.  Skin: Skin is warm and dry.  Psychiatric: She has a normal mood and affect.    ED Course  Procedures  (including critical care time)   Labs Reviewed  BASIC METABOLIC PANEL  CBC  LACTIC ACID, PLASMA   No results found.   No diagnosis found.  Pulse ox 99% room air normal  12:45 PM Patient informed of results.  She does not currently c/o abd pain. HA remains. New meds ordered.   1:43 PM Patient reports significant decrease in her Sx.  We discussed pmd / neuro f/u. MDM  This generally well female with history of headaches, any recent hysterectomy now presents with ongoing head pain.  The denial of any neurologic complaints.headache, or any findings on exam is reassuring.  Similarly reassuring is the absence of fever or evidence of systemic infection.  The patient's surgical site looks to be appropriately healing, and her x-ray was reassuring.  Given the patient's improvement, the absence of acute findings, she is appropriate for discharge with continued outpatient management.  We discussed, at length, the need for PMD and neurology followup.  Gerhard Munch, MD 01/23/12 1344

## 2012-01-23 NOTE — ED Notes (Signed)
Pt complaining of HA that started last night and more severe today. sts recent hysterectomy. sts she has been eating and drinking okay and taking pain meds as prescribed.

## 2012-01-25 ENCOUNTER — Telehealth: Payer: Self-pay | Admitting: Medical

## 2012-01-25 NOTE — Telephone Encounter (Signed)
Called and spoke w/pt. I advised her to wait and let the strips fall off spontaneously. She may shower and use a washcloth gently around the area if desired. She may remove a strip if it is partially coming off. Pt has next clinic appt on 02/11/12. Pt voiced understanding of all instructions and information.

## 2012-01-25 NOTE — Telephone Encounter (Signed)
Spoke with patient. She states that she has had BM now using colace. She asked if we had made her a follow-up appointment and I gave her the date listed in Epic. Follow-up is 02/11/12 @ 1:45 pm. The patient plans to keep this appointment. She was instructed to call back if she had any other questions or concerns before then. The patient did not voice any other questions at this time.

## 2012-01-25 NOTE — Telephone Encounter (Signed)
Patient called wanting to know if she can take off steri strips or needs to wait until they fall off.

## 2012-01-28 ENCOUNTER — Telehealth: Payer: Self-pay | Admitting: *Deleted

## 2012-01-28 NOTE — Telephone Encounter (Signed)
Patient left message stating that she is concerned about a marble sized hard area at the end of her incision. She read in her discharge instructions that if the skin around her incision became hard she should go to the doctor. She wanted to know if this is normal and if she needs to come in to be seen?

## 2012-01-28 NOTE — Telephone Encounter (Signed)
Spoke with patient she states that the area is not red, warm to touch, painful or any drainage. I advised that it is most likely just the healing process, however if she develops any of the above she should call us. Pt agrees.

## 2012-02-02 ENCOUNTER — Telehealth: Payer: Self-pay | Admitting: *Deleted

## 2012-02-02 DIAGNOSIS — Z90711 Acquired absence of uterus with remaining cervical stump: Secondary | ICD-10-CM

## 2012-02-02 NOTE — Telephone Encounter (Signed)
Patient needs to come in for evaluation, can be seen by any provider if I am not available soon. Needs UA to rule out UTI and incision check.

## 2012-02-02 NOTE — Telephone Encounter (Signed)
Patient called and stated that she has been reading things online about post hysterectomy problems and is concerned about a lump that she can feel above her incision when she presses down very deep and firmly. She is also concerned that she feels some pressure after she urinates. She is worried that these things are not normal and would really like Dr. Mont Dutton advice. Will route to Dr. Macon Large for her advice.

## 2012-02-02 NOTE — Telephone Encounter (Signed)
Patient  Scheduled for appt for tomorrow at 145, she agrees with plan.

## 2012-02-03 ENCOUNTER — Ambulatory Visit (INDEPENDENT_AMBULATORY_CARE_PROVIDER_SITE_OTHER): Payer: 59 | Admitting: Obstetrics & Gynecology

## 2012-02-03 ENCOUNTER — Encounter: Payer: Self-pay | Admitting: Obstetrics & Gynecology

## 2012-02-03 VITALS — BP 126/90 | HR 88 | Temp 97.9°F | Ht 66.0 in | Wt 189.1 lb

## 2012-02-03 DIAGNOSIS — R300 Dysuria: Secondary | ICD-10-CM

## 2012-02-03 LAB — POCT URINALYSIS DIP (DEVICE)
Glucose, UA: NEGATIVE mg/dL
Hgb urine dipstick: NEGATIVE
Specific Gravity, Urine: 1.02 (ref 1.005–1.030)
Urobilinogen, UA: 0.2 mg/dL (ref 0.0–1.0)

## 2012-02-03 NOTE — Progress Notes (Signed)
  Subjective:    Patient ID: Abigail [name], female    DOB: [birth year], 41 y.o.   MRN: [id number]  HPI  Abigail [name] is now 16 days s/p [facility]. She is here because she feels some urinary pressure/urgency. She also complains of a "deep" hard area on the right end of the incision. She denies other complaints.  Review of Systems     Objective:   Physical Exam  Well-healing incision, normal tissue throughout UA negative       Assessment & Plan:  Post op stable I will check a UC&S, but I have given her reassurance.

## 2012-02-05 LAB — URINE CULTURE: Organism ID, Bacteria: NO GROWTH

## 2012-02-11 ENCOUNTER — Ambulatory Visit (INDEPENDENT_AMBULATORY_CARE_PROVIDER_SITE_OTHER): Payer: 59 | Admitting: Obstetrics & Gynecology

## 2012-02-11 ENCOUNTER — Encounter: Payer: Self-pay | Admitting: Obstetrics & Gynecology

## 2012-02-11 VITALS — BP 134/84 | HR 104 | Temp 98.7°F | Ht 66.0 in | Wt 186.7 lb

## 2012-02-11 DIAGNOSIS — Z90711 Acquired absence of uterus with remaining cervical stump: Secondary | ICD-10-CM

## 2012-02-11 DIAGNOSIS — Z09 Encounter for follow-up examination after completed treatment for conditions other than malignant neoplasm: Secondary | ICD-10-CM

## 2012-02-11 NOTE — Progress Notes (Signed)
  Subjective:     Abigail [name] is a 41 y.o. female who presents to the clinic [facility] on 01/18/12 for fibroids and menorrhagia. Eating a regular diet without difficulty. Bowel movements are normal. The patient is not having any pain.  The following portions of the patient's history were reviewed and updated as appropriate: allergies, current medications, past family history, past medical history, past social history, past surgical history and problem list.  Normal pap smear at [facility] earlier this year, will obtain report.  Review of Systems Pertinent items are noted in HPI.    Objective:    BP 134/84  Pulse 104  Temp 98.7 F (37.1 C) (Oral)  Ht 5\' 6"  (1.676 m)  Wt 186 lb 11.2 oz (84.687 kg)  BMI 30.13 kg/m2  LMP 01/13/2012 General:  alert and no distress  Abdomen: soft, bowel sounds active, non-tender, no abnormal masses  Incision:   healing well, no drainage, no erythema, no hernia, no seroma, no swelling, no dehiscence, incision well approximated     01/18/12 SURGICAL PATHOLOGY Uterus and bilateral fallopian tubes, left ovary - ENDOMETRIUM: INACTIVE. NO EVIDENCE OF HYPERPLASIA OR CARCINOMA. - MYOMETRIUM: LEIOMYOMATA WITH DEGENERATIVE CHANGES. - BILATERAL FALLOPIAN TUBES: UNREMARKABLE. - LEFT OVARY: FOLLICULAR CYST. NO ENDOMETRIOSIS OR EVIDENCE OF MALIGNANCY.  Assessment:    Doing well postoperatively. Operative findings again reviewed. Pathology report discussed.    Plan:   1. Continue any current medications. 2. Wound care discussed. 3. Activity restrictions: none 4. Anticipated return to work: now. 5. Follow up: as need for pap smears, annual exam or other GYN concerns.

## 2012-02-11 NOTE — Progress Notes (Signed)
Called [facility] care to request pap and hpv results from earlier this year.

## 2012-02-11 NOTE — Patient Instructions (Addendum)
  Thank you for enrolling in MyChart. Please follow the instructions below to securely access your online medical record. MyChart allows you to send messages to your doctor, view your test results, manage appointments, and more.   How Do I Sign Up? 1. In your Internet browser, go to [employer] and enter [email or web address]. 2. Click on the Sign Up Now link in the Sign In box. You will see the New Member Sign Up page. 3. Enter your MyChart Access Code exactly as it appears below. You will not need to use this code after you've completed the sign-up process. If you do not sign up before the expiration date, you must request a new code. MyChart Access Code: [email or web address] Expires: 03/04/2012 12:58 PM  4. Enter your Social Security Number ([id number]) and Date of Birth (mm/dd/yyyy) as indicated and click Submit. You will be taken to the next sign-up page. 5. Create a MyChart ID. This will be your MyChart login ID and cannot be changed, so think of one that is secure and easy to remember. 6. Create a MyChart password. You can change your password at any time. 7. Enter your Password Reset Question and Answer. This can be used at a later time if you forget your password.  8. Enter your e-mail address. You will receive e-mail notification when new information is available in MyChart. 9. Click Sign Up. You can now view your medical record.   Additional Information Remember, MyChart is NOT to be used for urgent needs. For medical emergencies, dial 911.   Return to clinic for any scheduled appointments or for any gynecologic concerns as needed.

## 2012-02-15 ENCOUNTER — Other Ambulatory Visit: Payer: Self-pay | Admitting: Obstetrics & Gynecology

## 2012-02-15 DIAGNOSIS — Z1231 Encounter for screening mammogram for malignant neoplasm of breast: Secondary | ICD-10-CM

## 2012-02-18 ENCOUNTER — Telehealth: Payer: Self-pay | Admitting: *Deleted

## 2012-02-18 NOTE — Telephone Encounter (Signed)
Called pt and discussed her concern. She stated that her spotting has been a very light amount and has ranged from dk brown initially to light red now. She denies abdominal pain but has some mild cramping. I informed her that she has not done any internal damage and this bleeding is not of any urgent concern. If the bleeding becomes heavier like a menstrual period or if she has severe abdominal pain, she should go to [facility] for evaluation. Pt may take Tylenol or 1/2 tab of her Dilaudid if needed for discomfort.  Pt voiced understanding.

## 2012-02-18 NOTE — Telephone Encounter (Signed)
Patient called and left a message stating that she had an episode of severe vomiting a few days ago and has been spotting since that time. She is scared that she may have done some internal damage. Would like to know if she needs to come in to be seen or if this is okay.

## 2012-03-03 ENCOUNTER — Ambulatory Visit (HOSPITAL_COMMUNITY): Payer: 59

## 2012-03-09 ENCOUNTER — Telehealth: Payer: Self-pay | Admitting: *Deleted

## 2012-03-09 NOTE — Telephone Encounter (Signed)
Patient called left message wants note to return to work 03/14/12 after TAH . Saw Dr. Macon Large . Please fax to [phone] [facility] attention whom this concerns. Called patient to clarify and that we would get back to her .

## 2012-03-10 ENCOUNTER — Encounter: Payer: Self-pay | Admitting: *Deleted

## 2012-03-10 NOTE — Telephone Encounter (Signed)
Letter composed and faxed to patients work.

## 2012-03-16 ENCOUNTER — Telehealth: Payer: Self-pay | Admitting: Medical

## 2012-03-16 NOTE — Telephone Encounter (Signed)
Called pt and pt informed me that she is having spotting, a pink-tinged discharge, no itching but is having a strong odor.  She was prescribed flagyl for BV that another gyn had prescribed but she is taking her last one today and the discharge and odor is still present.  I advised pt to do not do soaking baths, use fragrant soaps/panty liners in that area, and possible use Refresh that is otc that may can help.  We scheduled her an appt for 03/23/12 @ 300 pm and I advised her to continue to do those preventive measures and see if it helps.  Pt stated understanding and did not have any further questions.

## 2012-03-16 NOTE — Telephone Encounter (Signed)
Patient left extensive message stating that she had "partial hysterectomy" on 01/18/12. She was having an odor and went to another gyn to be evaluated. Was dx with BV and given flagyl which has not helped. She has also had a few episodes of spotting and would like some advice about what to do mostly regarding the odor. Will come in to see Dr. Macon Large if necessary.

## 2012-03-23 ENCOUNTER — Encounter: Payer: Self-pay | Admitting: Advanced Practice Midwife

## 2012-03-23 ENCOUNTER — Ambulatory Visit (INDEPENDENT_AMBULATORY_CARE_PROVIDER_SITE_OTHER): Payer: 59 | Admitting: Advanced Practice Midwife

## 2012-03-23 VITALS — BP 126/91 | HR 84 | Temp 97.5°F | Resp 20

## 2012-03-23 DIAGNOSIS — N898 Other specified noninflammatory disorders of vagina: Secondary | ICD-10-CM

## 2012-03-23 DIAGNOSIS — N760 Acute vaginitis: Secondary | ICD-10-CM

## 2012-03-23 DIAGNOSIS — N920 Excessive and frequent menstruation with regular cycle: Secondary | ICD-10-CM

## 2012-03-23 DIAGNOSIS — A499 Bacterial infection, unspecified: Secondary | ICD-10-CM

## 2012-03-23 DIAGNOSIS — I10 Essential (primary) hypertension: Secondary | ICD-10-CM

## 2012-03-23 DIAGNOSIS — Z90711 Acquired absence of uterus with remaining cervical stump: Secondary | ICD-10-CM

## 2012-03-23 DIAGNOSIS — B9689 Other specified bacterial agents as the cause of diseases classified elsewhere: Secondary | ICD-10-CM

## 2012-03-23 MED ORDER — METRONIDAZOLE 0.75 % EX GEL: Freq: Two times a day (BID) | CUTANEOUS | Status: DC

## 2012-03-23 NOTE — Patient Instructions (Addendum)
Bacterial Vaginosis Bacterial vaginosis (BV) is a vaginal infection where the normal balance of bacteria in the vagina is disrupted. The normal balance is then replaced by an overgrowth of certain bacteria. There are several different kinds of bacteria that can cause BV. BV is the most common vaginal infection in women of childbearing age. CAUSES   The cause of BV is not fully understood. BV develops when there is an increase or imbalance of harmful bacteria.  Some activities or behaviors can upset the normal balance of bacteria in the vagina and put women at increased risk including:  Having a new sex partner or multiple sex partners.  Douching.  Using an intrauterine device (IUD) for contraception.  It is not clear what role sexual activity plays in the development of BV. However, women that have never had sexual intercourse are rarely infected with BV. Women do not get BV from toilet seats, bedding, swimming pools or from touching objects around them.  SYMPTOMS   Grey vaginal discharge.  A fish-like odor with discharge, especially after sexual intercourse.  Itching or burning of the vagina and vulva.  Burning or pain with urination.  Some women have no signs or symptoms at all. DIAGNOSIS  Your caregiver must examine the vagina for signs of BV. Your caregiver will perform lab tests and look at the sample of vaginal fluid through a microscope. They will look for bacteria and abnormal cells (clue cells), a pH test higher than 4.5, and a positive amine test all associated with BV.  RISKS AND COMPLICATIONS   Pelvic inflammatory disease (PID).  Infections following gynecology surgery.  Developing HIV.  Developing herpes virus. TREATMENT  Sometimes BV will clear up without treatment. However, all women with symptoms of BV should be treated to avoid complications, especially if gynecology surgery is planned. Female partners generally do not need to be treated. However, BV may spread  between female sex partners so treatment is helpful in preventing a recurrence of BV.   BV may be treated with antibiotics. The antibiotics come in either pill or vaginal cream forms. Either can be used with nonpregnant or pregnant women, but the recommended dosages differ. These antibiotics are not harmful to the baby.  BV can recur after treatment. If this happens, a second round of antibiotics will often be prescribed.  Treatment is important for pregnant women. If not treated, BV can cause a premature delivery, especially for a pregnant woman who had a premature birth in the past. All pregnant women who have symptoms of BV should be checked and treated.  For chronic reoccurrence of BV, treatment with a type of prescribed gel vaginally twice a week is helpful. HOME CARE INSTRUCTIONS   Finish all medication as directed by your caregiver.  Do not have sex until treatment is completed.  Tell your sexual partner that you have a vaginal infection. They should see their caregiver and be treated if they have problems, such as a mild rash or itching.  Practice safe sex. Use condoms. Only have 1 sex partner. PREVENTION  Basic prevention steps can help reduce the risk of upsetting the natural balance of bacteria in the vagina and developing BV:  Do not have sexual intercourse (be abstinent).  Do not douche.  Use all of the medicine prescribed for treatment of BV, even if the signs and symptoms go away.  Tell your sex partner if you have BV. That way, they can be treated, if needed, to prevent reoccurrence. SEEK MEDICAL CARE IF:     Your symptoms are not improving after 3 days of treatment.  You have increased discharge, pain, or fever. MAKE SURE YOU:   Understand these instructions.  Will watch your condition.  Will get help right away if you are not doing well or get worse. FOR MORE INFORMATION  Division of STD Prevention (DSTDP), Centers for Disease Control and Prevention:  www.cdc.gov/std American Social Health Association (ASHA): www.ashastd.org  Document Released: 04/27/2005 Document Revised: 07/20/2011 Document Reviewed: 10/18/2008 ExitCare Patient Information 2013 [employer].  

## 2012-03-23 NOTE — Progress Notes (Signed)
Pt states she has been having problems with vaginal odor x3wks. She went to [facility] 2 wks ago and was treated with metronidazole which did not cure the problem. Pt had hysterectomy by Dr. Macon Large in Sept. 2013.  She has also continued to have episodes of bleeding every month since surgery.

## 2012-03-23 NOTE — Progress Notes (Signed)
  Subjective:    Patient ID: Abigail [name], female    DOB: 1970/12/30, 41 y.o.   MRN: [id number]  HPI This is a 41 y.o. female who is s/p a Supracervical hysterectomy by Dr Macon Large on 01/18/12.  Since then she has had some spotting each month when her period would be. She also thinks she has recurrent BV. She has had this several times before.    Review of Systems See HPI    Objective:   Physical Exam Abdomen soft and nontender Pelvic:  EGBUS normal              Vagina pink and well rugated, no lesions               Cervix visible, no lesions, closed               Bimanual: nontender, no masses + whiff     Assessment & Plan:  A:  S/P supracervical hysterectomy       Spotting, monthly       BV, recurrent  P:  Rx Metrogel 0.75% for 7 days;  If does not help, may need long term therapy for 10 days then twice a week for 4-6 weeks.        Recommend Probiotics      Consult Dr Macon Large re: cyclic bleeding after surgery >> will schedule followup appt

## 2012-04-14 ENCOUNTER — Encounter: Payer: Self-pay | Admitting: Obstetrics & Gynecology

## 2012-04-14 ENCOUNTER — Ambulatory Visit (INDEPENDENT_AMBULATORY_CARE_PROVIDER_SITE_OTHER): Payer: 59 | Admitting: Obstetrics & Gynecology

## 2012-04-14 VITALS — BP 126/74 | HR 84 | Temp 97.2°F | Resp 12 | Wt 191.3 lb

## 2012-04-14 DIAGNOSIS — R399 Unspecified symptoms and signs involving the genitourinary system: Secondary | ICD-10-CM

## 2012-04-14 DIAGNOSIS — R3989 Other symptoms and signs involving the genitourinary system: Secondary | ICD-10-CM

## 2012-04-14 DIAGNOSIS — N760 Acute vaginitis: Secondary | ICD-10-CM

## 2012-04-14 DIAGNOSIS — A499 Bacterial infection, unspecified: Secondary | ICD-10-CM

## 2012-04-14 DIAGNOSIS — B9689 Other specified bacterial agents as the cause of diseases classified elsewhere: Secondary | ICD-10-CM

## 2012-04-14 DIAGNOSIS — N888 Other specified noninflammatory disorders of cervix uteri: Secondary | ICD-10-CM

## 2012-04-14 LAB — POCT URINALYSIS DIP (DEVICE)
Ketones, ur: NEGATIVE mg/dL
Protein, ur: NEGATIVE mg/dL
Specific Gravity, Urine: >=1.03 (ref 1.005–1.030)

## 2012-04-14 MED ORDER — MEDROXYPROGESTERONE ACETATE 10 MG PO TABS: 10.0000 mg | ORAL_TABLET | Freq: Every day | ORAL | Status: DC

## 2012-04-14 MED ORDER — FLUCONAZOLE 150 MG PO TABS: 150.0000 mg | ORAL_TABLET | Freq: Once | ORAL | Status: DC

## 2012-04-14 MED ORDER — METRONIDAZOLE 500 MG PO TABS: 500.0000 mg | ORAL_TABLET | Freq: Two times a day (BID) | ORAL | Status: AC

## 2012-04-14 MED ORDER — METRONIDAZOLE 0.75 % VA GEL: 1.0000 | Freq: Every day | VAGINAL | Status: DC

## 2012-04-14 NOTE — Progress Notes (Signed)
History:  41 y.o. s/p supracervical hysterectomy on 01/18/2012 here with abnormal spotting and cyclic bleeding since surgery, also has recurrent BV; tested positive during last visit.  She wants to know her options for treatment.  The following portions of the patient's history were reviewed and updated as appropriate: allergies, current medications, past family history, past medical history, past social history, past surgical history and problem list.  Review of Systems:  Pertinent items are noted in HPI.  Objective:  Physical Exam Blood pressure 126/74, pulse 84, temperature 97.2 F (36.2 C), temperature source Oral, resp. rate 12, weight 191 lb 4.8 oz (86.773 kg), last menstrual period 01/18/2012. Gen: NAD Abd: Soft, nontender and nondistended Pelvic: Normal appearing external genitalia; normal appearing vaginal mucosa and cervix.  Brown discharge seen.    Assessment & Plan:  Patient will be treated with extended metronidazole therapy for recurrent BV, will also try Provera to see if it could stop the bleeding from the residual endometrium high up in the cervical canal.  Offered trial of cryotherapy to see if this could work but she was told that this may not reach far inside the canal as needed to destroy the endometrial cells.  Recommended trachelectomy as definitive therapy. Risks of surgery reviewed, she was told she will be contracted by our surgical scheduler with time and date of her surgery. In the meantime, we will try the other methods to see if they will work for her.

## 2012-04-14 NOTE — Progress Notes (Signed)
Patient ID: Abigail [name], female   DOB: 1970-10-11, 41 y.o.   MRN: [id number]  Patient states that urine has strong odor. Denies frequency, urgency or burning with urination.

## 2012-04-14 NOTE — Patient Instructions (Signed)
Cervical cryotherapy is a procedure which involves freezing an area of abnormal tissue on the cervix. This tissue gradually disappears and the cervix heals. One cervical cryotherapy is usually sufficient to destroy the abnormal tissue.  Purpose  Cervical cryotherapy is a standard method used to treat cervical dysplasia, meaning the removal of abnormal cell tissue on the cervix.  Description  Cervical cryotherapy, or freezing, usually lasts about five minutes and causes a slight amount of discomfort. The procedure is usually performed in an outpatient setting.  Cervical cryotherapy is done by placing a small freeze-probe (cryoprobe) against the cervix that cools the cervix to sub-zero temperatures. The cells destroyed by freezing are shed afterwards in a heavy watery discharge. The main advantage of cryotherapy is that it is a simple procedure that requires inexpensive equipment.  The cryogenic device consists of a gas tank containing a refrigerant and non-explosive, non-toxic gas (usually nitrous oxide). The gas is delivered using flexible tubing through a gun-type attachment to the cryoprobe.  Diagnosis/Preparation  Preparation for cervical cryotherapy involves scheduling the procedure when the patient is not experiencing heavy menstrual flow. Ibuprofen or naproxen sodium may be given before cryotherapy to decrease cramping. If there is any doubt about the pregnancy status, a pregnancy test is performed.  Aftercare  Cervical cryotherapy is often followed by a heavy and often odorous discharge during the first month after the procedure. The discharge is due to the dead tissue cells leaving the treatment site. The patient should abstain from sexual intercourse and not use tampons for a period of three weeks after the procedure. Excessive exercise should also be avoided to lessen the occurrence of post-therapy bleeding.  Risks  The following risks have been associated with cervical cryotherapy:  Cramping.  Often occurs during the cryotherapy but rapidly subsides after treatment.  Bleeding and infection. Rare, but incidences have been reported.  More difficult Pap smears.  Following the procedure, it is considered normal to experience the following:  slight cramping for two to three days  watery discharge requiring several pad changes daily  bloody discharge, especially 12-16 days after the procedure

## 2012-04-18 ENCOUNTER — Telehealth: Payer: Self-pay | Admitting: *Deleted

## 2012-04-18 NOTE — Telephone Encounter (Signed)
Pt left message stating that her prescriptions were not received by her pharmacy after clinic visit on 12/5. I called [facility] and spoke w/pharmacist "Kathlene November" who confirmed that they did not have Rx info on file for pt from 04/14/12.  I gave him all of the Rx infor by phone. I called pt and informed her that she may pick up her prescriptions later today. Pt voiced understanding.

## 2012-04-20 ENCOUNTER — Ambulatory Visit: Payer: 59

## 2012-05-23 ENCOUNTER — Telehealth: Payer: Self-pay | Admitting: *Deleted

## 2012-05-23 NOTE — Telephone Encounter (Signed)
Surgical scheduler informed of the patient's decision.  Will follow up with patient later.

## 2012-05-23 NOTE — Telephone Encounter (Signed)
Pt left a message on nurse call line to inform Dr. Macon Large that she wants to postpone her surgery for now. She wants to think some more about it and will let you know when she is ready if she decides to proceed. States that she doesn't need a call back, just wanted to let Dr. Macon Large know about her decision.

## 2012-06-08 ENCOUNTER — Ambulatory Visit: Admit: 2012-06-08 | Payer: Self-pay | Admitting: Obstetrics & Gynecology

## 2012-06-08 SURGERY — HYSTERECTOMY, VAGINAL
Anesthesia: Choice | Site: Vagina

## 2012-06-17 ENCOUNTER — Ambulatory Visit (INDEPENDENT_AMBULATORY_CARE_PROVIDER_SITE_OTHER): Payer: 59 | Admitting: Internal Medicine

## 2012-06-17 ENCOUNTER — Encounter: Payer: Self-pay | Admitting: Internal Medicine

## 2012-06-17 ENCOUNTER — Ambulatory Visit: Payer: 59

## 2012-06-17 VITALS — BP 126/84 | HR 83 | Temp 97.0°F | Ht 66.0 in | Wt 195.2 lb

## 2012-06-17 DIAGNOSIS — Z0000 Encounter for general adult medical examination without abnormal findings: Secondary | ICD-10-CM

## 2012-06-17 DIAGNOSIS — Z13220 Encounter for screening for lipoid disorders: Secondary | ICD-10-CM

## 2012-06-17 DIAGNOSIS — Z130 Encounter for screening for diseases of the blood and blood-forming organs and certain disorders involving the immune mechanism: Secondary | ICD-10-CM

## 2012-06-17 DIAGNOSIS — Z131 Encounter for screening for diabetes mellitus: Secondary | ICD-10-CM

## 2012-06-17 DIAGNOSIS — I10 Essential (primary) hypertension: Secondary | ICD-10-CM

## 2012-06-17 DIAGNOSIS — Z9109 Other allergy status, other than to drugs and biological substances: Secondary | ICD-10-CM | POA: Insufficient documentation

## 2012-06-17 DIAGNOSIS — Z1321 Encounter for screening for nutritional disorder: Secondary | ICD-10-CM

## 2012-06-17 DIAGNOSIS — Z1329 Encounter for screening for other suspected endocrine disorder: Secondary | ICD-10-CM

## 2012-06-17 DIAGNOSIS — A048 Other specified bacterial intestinal infections: Secondary | ICD-10-CM

## 2012-06-17 LAB — LIPID PANEL
LDL Cholesterol: 124 mg/dL — ABNORMAL HIGH (ref 0–99)
Total CHOL/HDL Ratio: 5

## 2012-06-17 LAB — CBC
Hemoglobin: 13 g/dL (ref 12.0–15.0)
MCHC: 33.5 g/dL (ref 30.0–36.0)
Platelets: 368 10*3/uL (ref 150.0–400.0)
WBC: 7.6 10*3/uL (ref 4.5–10.5)

## 2012-06-17 LAB — BASIC METABOLIC PANEL
BUN: 11 mg/dL (ref 6–23)
CO2: 30 mEq/L (ref 19–32)
Chloride: 102 mEq/L (ref 96–112)
Creatinine, Ser: 0.7 mg/dL (ref 0.4–1.2)
Glucose, Bld: 99 mg/dL (ref 70–99)

## 2012-06-17 LAB — HEMOGLOBIN A1C: Hgb A1c MFr Bld: 6.1 % (ref 4.6–6.5)

## 2012-06-17 MED ORDER — FLUTICASONE PROPIONATE 50 MCG/ACT NA SUSP: 2.0000 | Freq: Every day | NASAL | Status: DC | PRN

## 2012-06-17 MED ORDER — TRIAMTERENE-HCTZ 37.5-25 MG PO TABS: 1.0000 | ORAL_TABLET | Freq: Every day | ORAL | Status: DC

## 2012-06-17 NOTE — Progress Notes (Signed)
HPI  Pt presents to the clinic today to establish care. She has not seen a PCP in a number of years. She has no concerns today.  Flu: 03/2012 Tetanus: 2007 Pap: 09/2011 LMP: post hysterectomy Mammogram: due Eye doctor: yearly Dentist: yearly  Past Medical History  Diagnosis Date  . S/P LEEP 1998  . Hypertension     maxide  . Headache   . Anxiety   . GERD (gastroesophageal reflux disease)     omeprazole  . Fibroids   . Anemia     iron supp  . PONV (postoperative nausea and vomiting)   . Depression     Current Outpatient Prescriptions  Medication Sig Dispense Refill  . Fexofenadine HCl (ALLEGRA PO) Take 1 tablet by mouth daily as needed. allergies      . fluticasone (FLONASE) 50 MCG/ACT nasal spray Place 2 sprays into the nose daily as needed. For allergies      . omeprazole (PRILOSEC OTC) 20 MG tablet Take 20 mg by mouth daily as needed. For heartburn      . triamterene-hydrochlorothiazide (MAXZIDE-25) 37.5-25 MG per tablet Take 1 tablet by mouth daily.      . butalbital-acetaminophen-caffeine (FIORICET) 50-325-40 MG per tablet Take 1-2 tablets by mouth every 6 (six) hours as needed for headache.  15 tablet  0  . metroNIDAZOLE (METROGEL) 0.75 % vaginal gel Place 1 Applicatorful vaginally at bedtime. Apply one applicatorful to vagina at bedtime for 10 days, then twice a week for 6 months.  70 g  5    Allergies  Allergen Reactions  . Tylox (Oxycodone-Acetaminophen) Itching    Family History  Problem Relation Age of Onset  . Hypertension Father   . Depression Father   . Diabetes Father   . Alcohol abuse Father   . Diabetes Sister   . Diabetes Brother   . Hypertension Mother   . Arthritis Mother     History   Social History  . Marital Status: Married    Spouse Name: N/A    Number of Children: N/A  . Years of Education: 14   Occupational History  . MEDICAL ASSISTANT    Social History Main Topics  . Smoking status: Never Smoker   . Smokeless tobacco: Never  Used  . Alcohol Use: 0.5 oz/week    1 drink(s) per week     Comment: occasi  . Drug Use: No  . Sexually Active: Yes    Birth Control/ Protection: Surgical   Other Topics Concern  . Not on file   Social History Narrative   Regular exercise-noCaffeine Use-yes    ROS:  Constitutional: Denies fever, malaise, fatigue, headache or abrupt weight changes.  HEENT: Denies eye pain, eye redness, ear pain, ringing in the ears, wax buildup, runny nose, nasal congestion, bloody nose, or sore throat. Respiratory: Denies difficulty breathing, shortness of breath, cough or sputum production.   Cardiovascular: Denies chest pain, chest tightness, palpitations or swelling in the hands or feet.  Gastrointestinal: Denies abdominal pain, bloating, constipation, diarrhea or blood in the stool.  GU: Denies frequency, urgency, pain with urination, blood in urine, odor or discharge. Musculoskeletal: Denies decrease in range of motion, difficulty with gait, muscle pain or joint pain and swelling.  Skin: Denies redness, rashes, lesions or ulcercations.  Neurological: Denies dizziness, difficulty with memory, difficulty with speech or problems with balance and coordination.   No other specific complaints in a complete review of systems (except as listed in HPI above).  PE:  BP  126/84  Pulse 83  Temp 97 F (36.1 C) (Oral)  Ht 5\' 6"  (1.676 m)  Wt 195 lb 4 oz (88.565 kg)  BMI 31.51 kg/m2  SpO2 99%  LMP 01/18/2012 Wt Readings from Last 3 Encounters:  06/17/12 195 lb 4 oz (88.565 kg)  04/14/12 191 lb 4.8 oz (86.773 kg)  02/11/12 186 lb 11.2 oz (84.687 kg)    General: Appears her stated age, overweight but well developed, well nourished in NAD. HEENT: Head: normal shape and size; Eyes: sclera white, no icterus, conjunctiva pink, PERRLA and EOMs intact; Ears: Tm's gray and intact, normal light reflex; Nose: mucosa pink and moist, septum midline; Throat/Mouth: Teeth present, mucosa pink and moist, no  lesions or ulcerations noted.  Neck: Normal range of motion. Neck supple, trachea midline. No massses, lumps or thyromegaly present.  Cardiovascular: Normal rate and rhythm. S1,S2 noted.  No murmur, rubs or gallops noted. No JVD or BLE edema. No carotid bruits noted. Pulmonary/Chest: Normal effort and positive vesicular breath sounds. No respiratory distress. No wheezes, rales or ronchi noted.  Abdomen: Soft and nontender. Normal bowel sounds, no bruits noted. No distention or masses noted. Liver, spleen and kidneys non palpable. Musculoskeletal: Normal range of motion. No signs of joint swelling. No difficulty with gait.  Neurological: Alert and oriented. Cranial nerves II-XII intact. Coordination normal. +DTRs bilaterally. Psychiatric: Mood and affect normal. Behavior is normal. Judgment and thought content normal.     Assessment and Plan:  Preventative Health Maintenance:   Start a diet and exercise program Call to schedule your mammogram Will obtain basic screening labs today

## 2012-06-17 NOTE — Assessment & Plan Note (Signed)
Avoid triggers Refilled flonase today

## 2012-06-17 NOTE — Patient Instructions (Signed)

## 2012-06-17 NOTE — Assessment & Plan Note (Signed)
Continue current meds Triamterene refilled today Consume a low sodium diet

## 2012-06-18 LAB — VITAMIN D 25 HYDROXY (VIT D DEFICIENCY, FRACTURES): Vit D, 25-Hydroxy: 38 ng/mL (ref 30–89)

## 2012-06-20 LAB — H. PYLORI ANTIBODY, IGG: H Pylori IgG: 1.12 {ISR} — ABNORMAL HIGH

## 2012-06-21 ENCOUNTER — Other Ambulatory Visit: Payer: Self-pay | Admitting: Internal Medicine

## 2012-06-21 DIAGNOSIS — R768 Other specified abnormal immunological findings in serum: Secondary | ICD-10-CM

## 2012-06-21 MED ORDER — AMOXICILLIN 500 MG PO CAPS: 1000.0000 mg | ORAL_CAPSULE | Freq: Two times a day (BID) | ORAL | Status: DC

## 2012-06-21 MED ORDER — PANTOPRAZOLE SODIUM 40 MG PO TBEC: 40.0000 mg | DELAYED_RELEASE_TABLET | Freq: Two times a day (BID) | ORAL | Status: DC

## 2012-06-21 MED ORDER — CLARITHROMYCIN 500 MG PO TABS: 500.0000 mg | ORAL_TABLET | Freq: Two times a day (BID) | ORAL | Status: DC

## 2012-06-21 NOTE — Progress Notes (Signed)
h pylori IGG positive

## 2012-06-24 ENCOUNTER — Ambulatory Visit: Payer: 59 | Admitting: Internal Medicine

## 2012-06-26 ENCOUNTER — Other Ambulatory Visit: Payer: Self-pay

## 2012-08-10 ENCOUNTER — Encounter: Payer: Self-pay | Admitting: Advanced Practice Midwife

## 2012-09-02 ENCOUNTER — Ambulatory Visit (INDEPENDENT_AMBULATORY_CARE_PROVIDER_SITE_OTHER): Payer: 59 | Admitting: Family Medicine

## 2012-09-02 ENCOUNTER — Telehealth: Payer: Self-pay | Admitting: Internal Medicine

## 2012-09-02 VITALS — BP 130/88 | Temp 98.0°F | Wt 197.0 lb

## 2012-09-02 DIAGNOSIS — R300 Dysuria: Secondary | ICD-10-CM

## 2012-09-02 DIAGNOSIS — M549 Dorsalgia, unspecified: Secondary | ICD-10-CM

## 2012-09-02 LAB — POCT URINALYSIS DIPSTICK
Blood, UA: NEGATIVE
Nitrite, UA: NEGATIVE
Urobilinogen, UA: 0.2
pH, UA: 6.5

## 2012-09-02 NOTE — Addendum Note (Signed)
Addended by: Bonnye Fava on: 09/02/2012 03:15 PM   Modules accepted: Orders

## 2012-09-02 NOTE — Progress Notes (Signed)
Chief Complaint  Patient presents with  . Urinary Frequency    DYSURIA     HPI:  Acute visit for dysuria: -[facility] office pt, couldn't get appointment at her office -started: a few days ago - she has increased her water intake because started new exercise program, little low back pain, mild, bilat  since started exercising, no radiation, numbness, weakness or bowel or bladder incontinence reported -symptoms: urinary frequency, some tingling when urinated yesterday -denies: fevers, vomiting, nausea, pelvic or flank pain -history of: no hxx uti  ROS: See pertinent positives and negatives per HPI.  Past Medical History  Diagnosis Date  . S/P LEEP 1998  . Hypertension     maxide  . Headache   . Anxiety   . GERD (gastroesophageal reflux disease)     omeprazole  . Fibroids   . Anemia     iron supp  . PONV (postoperative nausea and vomiting)   . Depression     Family History  Problem Relation Age of Onset  . Hypertension Father   . Depression Father   . Diabetes Father   . Alcohol abuse Father   . Diabetes Sister   . Diabetes Brother   . Hypertension Mother   . Arthritis Mother     History   Social History  . Marital Status: Married    Spouse Name: N/A    Number of Children: N/A  . Years of Education: 14   Occupational History  . MEDICAL ASSISTANT    Social History Main Topics  . Smoking status: Never Smoker   . Smokeless tobacco: Never Used  . Alcohol Use: 0.5 oz/week    1 drink(s) per week     Comment: occasi  . Drug Use: No  . Sexually Active: Yes    Birth Control/ Protection: Surgical   Other Topics Concern  . Not on file   Social History Narrative   Regular exercise-no   Caffeine Use-yes    Current outpatient prescriptions:amoxicillin (AMOXIL) 500 MG capsule, Take 2 capsules (1,000 mg total) by mouth 2 (two) times daily., Disp: 56 capsule, Rfl: 0;  butalbital-acetaminophen-caffeine (FIORICET) 50-325-40 MG per tablet, Take 1-2 tablets by mouth every  6 (six) hours as needed for headache., Disp: 15 tablet, Rfl: 0;  clarithromycin (BIAXIN) 500 MG tablet, Take 1 tablet (500 mg total) by mouth 2 (two) times daily., Disp: 28 tablet, Rfl: 0 Fexofenadine HCl (ALLEGRA PO), Take 1 tablet by mouth daily as needed. allergies, Disp: , Rfl: ;  fluticasone (FLONASE) 50 MCG/ACT nasal spray, Place 2 sprays into the nose daily as needed. For allergies, Disp: 16 g, Rfl: 2;  metroNIDAZOLE (METROGEL) 0.75 % vaginal gel, Place 1 Applicatorful vaginally at bedtime. Apply one applicatorful to vagina at bedtime for 10 days, then twice a week for 6 months., Disp: 70 g, Rfl: 5 omeprazole (PRILOSEC OTC) 20 MG tablet, Take 20 mg by mouth daily as needed. For heartburn, Disp: , Rfl: ;  pantoprazole (PROTONIX) 40 MG tablet, Take 1 tablet (40 mg total) by mouth 2 (two) times daily., Disp: 30 tablet, Rfl: 0;  triamterene-hydrochlorothiazide (MAXZIDE-25) 37.5-25 MG per tablet, Take 1 each (1 tablet total) by mouth daily., Disp: 30 tablet, Rfl: 2  EXAM:  Filed Vitals:   09/02/12 1445  BP: 130/88  Temp: 98 F (36.7 C)    Body mass index is 31.81 kg/(m^2).  GENERAL: vitals reviewed and listed above, alert, oriented, appears well hydrated and in no acute distress  HEENT: atraumatic, conjunttiva clear, no  obvious abnormalities on inspection of external nose and ears  NECK: no obvious masses on inspection  LUNGS: clear to auscultation bilaterally, no wheezes, rales or rhonchi, good air movement  CV: HRRR, no peripheral edema  ABD: soft, NTTP, No CVA TTP  MS: moves all extremities without noticeable abnormality -mild TTP and spasm bilat lumbar paraspinal muscles, normal LE movements and sensation, normal gait  PSYCH: pleasant and cooperative, no obvious depression or anxiety  ASSESSMENT AND PLAN:  Discussed the following assessment and plan:  Dysuria - Plan: POCT urinalysis dipstick  Back pain  -UA not c/w UTI, UCx pending and will call her if + -per review of  chart appears she has been seen several times for complaints of ? UTI with neg urine studies - advised if she has persistent symptoms to see PCP and maybe a urologist -back pain c/w strain from recent new workout program, advised heat, stretching, supportive care and f/u with PCP if persists in 2-3 weeks. -Patient advised to return or notify a doctor immediately if symptoms worsen or persist or new concerns arise.  There are no Patient Instructions on file for this visit.   Kriste Basque R.

## 2012-09-02 NOTE — Telephone Encounter (Signed)
Patient Information:  Caller Name: [facility]  Phone: [phone]  Patient: Abigail [name]  Gender: Female  DOB: 05/05/1971  Age: 42 Years  PCP: Nicki Reaper  Pregnant: No  Office Follow Up:  Does the office need to follow up with this patient?: Yes  Instructions For The Office: need for appt today  krs/can  RN Note:  Flank pain x 4 days, with malaise starting 09/02/12.  States has had pain in the back of the ribs and bilateral sides.  Afebrile.   Denies urinary symptoms or overt pain on urination.  Per flank pain protocol, advised appt today; per Epic, no appts available for 09/02/12.  Patient was given appt 09/05/12 by staff; info to office for staff/provider review/appt workin/callback.  May reach patient at [phone].  krs/can  Symptoms  Reason For Call & Symptoms: ? kidney /flank pain  Reviewed Health History In EMR: Yes  Reviewed Medications In EMR: Yes  Reviewed Allergies In EMR: Yes  Reviewed Surgeries / Procedures: Yes  Date of Onset of Symptoms: 08/29/2012 OB / GYN:  LMP: Unknown  Guideline(s) Used:  Flank Pain  Disposition Per Guideline:   See Today in Office  Reason For Disposition Reached:   Moderate pain (e.g., interferes with normal activities or awakens from sleep)  Advice Given:  N/A  Patient Will Follow Care Advice:  YES

## 2012-09-02 NOTE — Telephone Encounter (Signed)
Pt called back.  She is scheduled with Dr. Selena Batten at [facility] this afternoon.

## 2012-09-04 LAB — URINE CULTURE

## 2012-09-05 ENCOUNTER — Ambulatory Visit: Payer: 59 | Admitting: Internal Medicine

## 2013-02-24 ENCOUNTER — Ambulatory Visit: Payer: Self-pay | Admitting: Nurse Practitioner

## 2013-02-27 ENCOUNTER — Ambulatory Visit: Payer: 59 | Admitting: Internal Medicine

## 2013-03-01 ENCOUNTER — Encounter: Payer: Self-pay | Admitting: Nurse Practitioner

## 2013-03-02 ENCOUNTER — Ambulatory Visit (INDEPENDENT_AMBULATORY_CARE_PROVIDER_SITE_OTHER): Payer: 59 | Admitting: Nurse Practitioner

## 2013-03-02 ENCOUNTER — Encounter: Payer: Self-pay | Admitting: Nurse Practitioner

## 2013-03-02 VITALS — BP 140/94 | HR 80 | Resp 16 | Ht 65.0 in | Wt 195.0 lb

## 2013-03-02 DIAGNOSIS — Z0000 Encounter for general adult medical examination without abnormal findings: Secondary | ICD-10-CM

## 2013-03-02 DIAGNOSIS — R300 Dysuria: Secondary | ICD-10-CM

## 2013-03-02 DIAGNOSIS — Z01419 Encounter for gynecological examination (general) (routine) without abnormal findings: Secondary | ICD-10-CM

## 2013-03-02 LAB — POCT URINALYSIS DIPSTICK
Glucose, UA: NEGATIVE
Ketones, UA: NEGATIVE
Urobilinogen, UA: NEGATIVE

## 2013-03-02 LAB — HEMOGLOBIN, FINGERSTICK: Hemoglobin, fingerstick: 13.9 g/dL (ref 12.0–16.0)

## 2013-03-02 NOTE — Progress Notes (Signed)
Patient ID: Abigail [name], female   DOB: [birth year], 42 y.o.   MRN: [id number] 42 y.o. G2P2002 Married African American Fe here for annual exam. She has monthly vaginal spotting X 3 days enough for mini pad. Still light cramps, and PMS. Maybe last week some dysuria.  Patient's last menstrual period was 01/18/2012.          Sexually active: yes  The current method of family planning is status post hysterectomy.    Exercising: no  The patient does not participate in regular exercise at present. Smoker:  no  Health Maintenance: Pap: 09/25/11, WNL, neg HR HPV TDaP: 2008 Labs: HB: 13.9 Urine: trace leuk's, pH 6.0   reports that she has never smoked. She has never used smokeless tobacco. She reports that she drinks about 0.5 ounces of alcohol per week. She reports that she does not use illicit drugs.  Past Medical History  Diagnosis Date  . S/P LEEP 1998  . Hypertension     maxide  . Headache(784.0)   . Anxiety   . GERD (gastroesophageal reflux disease)     omeprazole  . Fibroids   . Anemia     iron supp  . PONV (postoperative nausea and vomiting)   . Depression     Past Surgical History  Procedure Laterality Date  . Leep  1998    Abnormal pap  . Supracervical abdominal hysterectomy  01/18/2012    Procedure: HYSTERECTOMY SUPRACERVICAL ABDOMINAL;  Surgeon: Tereso Newcomer, MD;  Location: [facility];  Service: Gynecology;  Laterality: N/A;  . Tubal ligation  1997  . Wisdom tooth extraction      Current Outpatient Prescriptions  Medication Sig Dispense Refill  . amoxicillin (AMOXIL) 500 MG capsule Take 2 capsules (1,000 mg total) by mouth 2 (two) times daily.  56 capsule  0  . clarithromycin (BIAXIN) 500 MG tablet Take 1 tablet (500 mg total) by mouth 2 (two) times daily.  28 tablet  0  . Fexofenadine HCl (ALLEGRA PO) Take 1 tablet by mouth daily as needed. allergies      . fluticasone (FLONASE) 50 MCG/ACT nasal spray Place 2 sprays into the nose daily as needed. For allergies  16 g  2   . metroNIDAZOLE (METROGEL) 0.75 % vaginal gel Place 1 Applicatorful vaginally at bedtime. Apply one applicatorful to vagina at bedtime for 10 days, then twice a week for 6 months.  70 g  5  . omeprazole (PRILOSEC OTC) 20 MG tablet Take 20 mg by mouth daily as needed. For heartburn      . pantoprazole (PROTONIX) 40 MG tablet Take 1 tablet (40 mg total) by mouth 2 (two) times daily.  30 tablet  0  . triamterene-hydrochlorothiazide (MAXZIDE-25) 37.5-25 MG per tablet Take 1 each (1 tablet total) by mouth daily.  30 tablet  2   No current facility-administered medications for this visit.    Family History  Problem Relation Age of Onset  . Hypertension Father   . Depression Father   . Diabetes Father   . Alcohol abuse Father   . Diabetes Sister   . Diabetes Brother   . Hypertension Mother   . Arthritis Mother   . Hypertension Sister     ROS:  Pertinent items are noted in HPI.  Otherwise, a comprehensive ROS was negative.  Exam:   BP 140/94  Pulse 80  Resp 16  Ht 5\' 5"  (1.651 m)  Wt 195 lb (88.451 kg)  BMI 32.45 kg/m2  LMP 01/18/2012  Height: 5\' 5"  (165.1 cm)  Ht Readings from Last 3 Encounters:  03/02/13 5\' 5"  (1.651 m)  06/17/12 5\' 6"  (1.676 m)  02/11/12 5\' 6"  (1.676 m)    General appearance: alert, cooperative and appears stated age Head: Normocephalic, without obvious abnormality, atraumatic Neck: no adenopathy, supple, symmetrical, trachea midline and thyroid normal to inspection and palpation Lungs: clear to auscultation bilaterally Breasts: normal appearance, no masses or tenderness Heart: regular rate and rhythm Abdomen: soft, non-tender; no masses,  no organomegaly Extremities: extremities normal, atraumatic, no cyanosis or edema Skin: Skin color, texture, turgor normal. No rashes or lesions Lymph nodes: Cervical, supraclavicular, and axillary nodes normal. No abnormal inguinal nodes palpated Neurologic: Grossly normal   Pelvic: External genitalia:  no lesions               Urethra:  normal appearing urethra with no masses, tenderness or lesions              Bartholin's and Skene's: normal                 Vagina: normal appearing vagina with normal color and discharge, no lesions              Cervix: anteverted              Pap taken: no Bimanual Exam:  Uterus:  uterus absent              Adnexa: no mass, fullness, tenderness               Rectovaginal: Confirms               Anus:  normal sphincter tone, no lesions  A:  Well Woman with normal exam  S/P Supracervical Hysterectomy and LSO secondary to UTE fibroids, menorrhagia 9/13  S/P Leep 1992 with normal pap since  History of HTN  R/O UTI  P:   Pap smear as per guidelines   Mammogram due now and will schedule  Will follow with urine culture  Counseled on breast self exam, adequate intake of calcium and vitamin D, diet and exercise return annually or prn  An After Visit Summary was printed and given to the patient.

## 2013-03-02 NOTE — Patient Instructions (Signed)

## 2013-03-04 LAB — URINE CULTURE: Colony Count: NO GROWTH

## 2013-03-05 NOTE — Progress Notes (Signed)
Encounter reviewed by Dr. Brook Silva.  

## 2013-03-16 ENCOUNTER — Other Ambulatory Visit: Payer: Self-pay

## 2013-04-27 ENCOUNTER — Other Ambulatory Visit: Payer: Self-pay | Admitting: Obstetrics & Gynecology

## 2013-06-05 ENCOUNTER — Ambulatory Visit (INDEPENDENT_AMBULATORY_CARE_PROVIDER_SITE_OTHER): Payer: BC Managed Care – PPO | Admitting: Internal Medicine

## 2013-06-05 ENCOUNTER — Encounter: Payer: Self-pay | Admitting: Internal Medicine

## 2013-06-05 ENCOUNTER — Encounter: Payer: Self-pay | Admitting: *Deleted

## 2013-06-05 ENCOUNTER — Telehealth: Payer: Self-pay | Admitting: Internal Medicine

## 2013-06-05 VITALS — BP 142/84 | HR 85 | Temp 98.1°F | Wt 199.6 lb

## 2013-06-05 DIAGNOSIS — R519 Headache, unspecified: Secondary | ICD-10-CM

## 2013-06-05 DIAGNOSIS — R51 Headache: Secondary | ICD-10-CM

## 2013-06-05 DIAGNOSIS — I10 Essential (primary) hypertension: Secondary | ICD-10-CM

## 2013-06-05 DIAGNOSIS — G44209 Tension-type headache, unspecified, not intractable: Secondary | ICD-10-CM

## 2013-06-05 MED ORDER — PROMETHAZINE-DM 6.25-15 MG/5ML PO SYRP: 5.0000 mL | ORAL_SOLUTION | Freq: Four times a day (QID) | ORAL | Status: DC | PRN

## 2013-06-05 MED ORDER — FEXOFENADINE HCL 180 MG PO TABS: 180.0000 mg | ORAL_TABLET | Freq: Every day | ORAL | Status: DC

## 2013-06-05 MED ORDER — CYCLOBENZAPRINE HCL 5 MG PO TABS: 5.0000 mg | ORAL_TABLET | Freq: Three times a day (TID) | ORAL | Status: AC | PRN

## 2013-06-05 MED ORDER — TRIAMTERENE-HCTZ 37.5-25 MG PO TABS: 1.0000 | ORAL_TABLET | Freq: Every day | ORAL | Status: DC

## 2013-06-05 MED ORDER — FLUTICASONE PROPIONATE 50 MCG/ACT NA SUSP: 1.0000 | Freq: Every day | NASAL | Status: DC

## 2013-06-05 MED ORDER — PROMETHAZINE-PHENYLEPHRINE 6.25-5 MG/5ML PO SYRP: 5.0000 mL | ORAL_SOLUTION | ORAL | Status: DC | PRN

## 2013-06-05 NOTE — Progress Notes (Signed)
Abigail [name] [id number] 06/05/2013   Chief Complaint  Patient presents with  . Headache    Subjective  Headache  This is a new problem. The current episode started in the past 7 days. The problem occurs constantly. The problem has been unchanged. The pain is located in the bilateral region. The pain does not radiate. The pain quality is similar to prior headaches. The quality of the pain is described as squeezing and aching. The pain is mild. Associated symptoms include muscle aches (neck) and sinus pressure. Pertinent negatives include no abdominal pain, abnormal behavior, anorexia, back pain, blurred vision, coughing, dizziness, drainage, ear pain, eye pain, eye redness, eye watering, facial sweating, fever, hearing loss, insomnia, neck pain, numbness, phonophobia, photophobia, rhinorrhea, scalp tenderness, sore throat, tingling, tinnitus, visual change, vomiting, weakness or weight loss. Nothing aggravates the symptoms. Treatments tried: pseudoephedrine, Allegra. The treatment provided no relief. Her past medical history is significant for hypertension and sinus disease. There is no history of cancer, cluster headaches, migraine headaches, migraines in the family or recent head traumas.    Past Medical History  Diagnosis Date  . S/P LEEP 1992  . Headache(784.0)   . Anxiety   . GERD (gastroesophageal reflux disease)     omeprazole  . Fibroids   . Anemia     iron supp  . Depression   . Hypertension 2012    Maxide    Past Surgical History  Procedure Laterality Date  . Leep  1998    Abnormal pap  . Supracervical abdominal hysterectomy  01/18/2012    Procedure: HYSTERECTOMY SUPRACERVICAL ABDOMINAL with LSO;  Surgeon: Osborne Oman, MD;  Location: [facility];  Service: Gynecology;  Laterality: N/A;  . Tubal ligation  1997  . Wisdom tooth extraction      Family History  Problem Relation Age of Onset  . Hypertension Father   . Depression Father   . Diabetes Father   . Alcohol abuse  Father   . Diabetes Sister   . Diabetes Brother   . Hypertension Mother   . Arthritis Mother   . Hypertension Sister     History  Substance Use Topics  . Smoking status: Never Smoker   . Smokeless tobacco: Never Used  . Alcohol Use: 0.5 oz/week    1 drink(s) per week     Comment: occasi    Current Outpatient Prescriptions on File Prior to Visit  Medication Sig Dispense Refill  . Fexofenadine HCl (ALLEGRA PO) Take 1 tablet by mouth daily as needed. allergies      . metroNIDAZOLE (METROGEL) 0.75 % vaginal gel INSERT 1 APPLICATORFUL VAGINALLY AT BEDTIME FOR 10 DAYS THEN TWICE A WEEK FOR 6 MONTHS  70 g  2  . omeprazole (PRILOSEC OTC) 20 MG tablet Take 20 mg by mouth daily as needed. For heartburn      . triamterene-hydrochlorothiazide (MAXZIDE-25) 37.5-25 MG per tablet Take 1 each (1 tablet total) by mouth daily.  30 tablet  2   No current facility-administered medications on file prior to visit.    Allergies:  Allergies  Allergen Reactions  . Tylox [Oxycodone-Acetaminophen] Itching    Review of Systems  Constitutional: Negative for fever, chills, weight loss, malaise/fatigue and diaphoresis.  HENT: Positive for congestion and sinus pressure. Negative for ear pain, hearing loss, nosebleeds, rhinorrhea, sore throat and tinnitus.   Eyes: Negative for blurred vision, photophobia, pain and redness.  Respiratory: Negative for cough, hemoptysis, sputum production, shortness of breath and wheezing.   Cardiovascular: Negative  for chest pain, palpitations, orthopnea and leg swelling.  Gastrointestinal: Negative for vomiting, abdominal pain, diarrhea, constipation, blood in stool and anorexia.  Musculoskeletal: Negative for back pain and neck pain.  Neurological: Positive for headaches. Negative for dizziness, tingling, weakness and numbness.  Psychiatric/Behavioral: Negative for depression and suicidal ideas. The patient is not nervous/anxious and does not have insomnia.         Objective  Filed Vitals:   06/05/13 1316  BP: 142/84  Pulse: 85  Temp: 98.1 F (36.7 C)  TempSrc: Oral  Weight: 199 lb 9.6 oz (90.538 kg)  SpO2: 97%    Physical Exam  Nursing note and vitals reviewed. Constitutional: She is oriented to person, place, and time. She appears well-developed and well-nourished. No distress.  HENT:  Head: Normocephalic and atraumatic.  Right Ear: Hearing, tympanic membrane, external ear and ear canal normal.  Left Ear: Hearing, tympanic membrane, external ear and ear canal normal.  Nose: No rhinorrhea or nasal deformity. No epistaxis. Right sinus exhibits maxillary sinus tenderness and frontal sinus tenderness. Left sinus exhibits maxillary sinus tenderness and frontal sinus tenderness.  Eyes: Conjunctivae and EOM are normal. Pupils are equal, round, and reactive to light.  Neck: No JVD present. No tracheal deviation present.  Cardiovascular: Normal rate, regular rhythm, normal heart sounds and intact distal pulses.   Respiratory: Effort normal and breath sounds normal. No stridor. No respiratory distress. She has no wheezes. She exhibits no tenderness.  Musculoskeletal: Normal range of motion. She exhibits no tenderness.  Neurological: She is alert and oriented to person, place, and time. No cranial nerve deficit or sensory deficit. She exhibits normal muscle tone. Coordination and gait normal.  Skin: Skin is warm and dry. She is not diaphoretic.  Psychiatric: She has a normal mood and affect. Her behavior is normal. Judgment and thought content normal.    BP Readings from Last 3 Encounters:  06/05/13 142/84  03/02/13 140/94  09/02/12 130/88    Wt Readings from Last 3 Encounters:  06/05/13 199 lb 9.6 oz (90.538 kg)  03/02/13 195 lb (88.451 kg)  09/02/12 197 lb (89.359 kg)    Lab Results  Component Value Date   WBC 7.6 06/17/2012   HGB 13.0 06/17/2012   HCT 38.8 06/17/2012   PLT 368.0 06/17/2012   GLUCOSE 99 06/17/2012   CHOL 198 06/17/2012    TRIG 156.0* 06/17/2012   HDL 43.20 06/17/2012   LDLCALC 124* 06/17/2012   ALT 21 01/13/2012   AST 26 01/13/2012   NA 138 06/17/2012   K 3.8 06/17/2012   CL 102 06/17/2012   CREATININE 0.7 06/17/2012   BUN 11 06/17/2012   CO2 30 06/17/2012   HGBA1C 6.1 06/17/2012     Ct Head Wo Contrast  01/23/2012   *RADIOLOGY REPORT*  Clinical Data: Headache and anxiety  CT HEAD WITHOUT CONTRAST  Technique:  Contiguous axial images were obtained from the base of the skull through the vertex without contrast.  Comparison: None.  Findings: No mass effect, midline shift, or acute intracranial hemorrhage.  Mild global atrophy for age.  Mastoid air cells and visualized paranasal sinuses are clear.  Intact cranium.  IMPRESSION: No acute intracranial pathology.   Original Report Authenticated By: Jamas Lav, M.D.     Assessment and Plan  Headache- Sinusitis -  Reviewed CT on file from 01/23/12. Symptomatic treatment -  Promethazine VC syrup to help with congestion symptoms.  Flexeril for muscle tightness in neck.   Refilled Flonase, Allegra, and Maxzide  prescriptions.   Patient was instructed to notify the office if any new symptoms emerged or the current symptoms become worse.   No Follow-up on file. Donna Christen    I have personally reviewed this case with PA student. I also personally examined this patient. I agree with history and findings as documented above. I reviewed, discussed and approve of the assessment and plan as listed above. Gwendolyn Grant, MD

## 2013-06-05 NOTE — Assessment & Plan Note (Signed)
BP Readings from Last 3 Encounters:  06/05/13 142/84  03/02/13 140/94  09/02/12 130/88   The current medical regimen is effective;  continue present plan and medications.

## 2013-06-05 NOTE — Telephone Encounter (Signed)
Change to prometh DM syrup - erx done

## 2013-06-05 NOTE — Patient Instructions (Addendum)
It was good to see you today.  We have reviewed your prior records including labs and tests today  For your sinus and headache symptoms, take Flonase and Allegra every day To treat sinus pressure and congestion, use prescription syrup as needed and for neck spasm, uses Flexeril at bedtime as needed  Your prescription(s) have been submitted to your pharmacy. Please take as directed and contact our office if you believe you are having problem(s) with the medication(s).  Also refill on your blood pressure medication done as requested  If pain symptoms unimproved in next 2-4 weeks, please call for reevaluation, call sooner if worse  Sinus Headache A sinus headache is when your sinuses become clogged or swollen. Sinus headaches can range from mild to severe.  CAUSES A sinus headache can have different causes, such as:  Colds.  Sinus infections.  Allergies. SYMPTOMS  Symptoms of a sinus headache may vary and can include:  Headache.  Pain or pressure in the face.  Congested or runny nose.  Fever.  Inability to smell.  Pain in upper teeth. Weather changes can make symptoms worse. TREATMENT  The treatment of a sinus headache depends on the cause.  Sinus pain caused by a sinus infection may be treated with antibiotic medicine.  Sinus pain caused by allergies may be helped by allergy medicines (antihistamines) and medicated nasal sprays.  Sinus pain caused by congestion may be helped by flushing the nose and sinuses with saline solution. HOME CARE INSTRUCTIONS   If antibiotics are prescribed, take them as directed. Finish them even if you start to feel better.  Only take over-the-counter or prescription medicines for pain, discomfort, or fever as directed by your caregiver.  If you have congestion, use a nasal spray to help reduce pressure. SEEK IMMEDIATE MEDICAL CARE IF:  You have a fever.  You have headaches more than once a week.  You have sensitivity to light or  sound.  You have repeated nausea and vomiting.  You have vision problems.  You have sudden, severe pain in your face or head.  You have a seizure.  You are confused.  Your sinus headaches do not get better after treatment. Many people think they have a sinus headache when they actually have migraines or tension headaches. MAKE SURE YOU:   Understand these instructions.  Will watch your condition.  Will get help right away if you are not doing well or get worse. Document Released: 06/04/2004 Document Revised: 07/20/2011 Document Reviewed: 07/26/2010 [facility] Patient Information 2014 [place]. Tension Headache A tension headache is pain, pressure, or aching felt over the front and sides of the head. Tension headaches often come after stress, feeling worried (anxiety), or feeling sad or down for a while (depressed). HOME CARE  Only take medicine as told by your doctor.  Lie down in a dark, quiet room when you have a headache.  Keep a journal to find out if certain things bring on headaches. For example, write down:  What you eat and drink.  How much sleep you get.  Any change to your diet or medicines.  Relax by getting a massage or doing other relaxing activities.  Put ice or heat packs on the head and neck area as told by your doctor.  Lessen stress.  Sit up straight. Do not tighten (tense) your muscles.  Quit smoking if you smoke.  Lessen how much alcohol you drink.  Lessen how much caffeine you drink, or stop drinking caffeine.  Eat and exercise  regularly.  Get enough sleep.  Avoid using too much pain medicine. GET HELP RIGHT AWAY IF:   Your headache becomes really bad.  You have a fever.  You have a stiff neck.  You have trouble seeing.  Your muscles are weak, or you lose muscle control.  You lose your balance or have trouble walking.  You feel like you will pass out (faint), or you pass out.  You have really bad symptoms that are  different than your first symptoms.  You have problems with the medicines given to you by your doctor.  Your medicines do not work.  Your headache feels different than the other headaches.  You feel sick to your stomach (nauseous) or throw up (vomit). MAKE SURE YOU:   Understand these instructions.  Will watch your condition.  Will get help right away if you are not doing well or get worse. Document Released: 07/22/2009 Document Revised: 07/20/2011 Document Reviewed: 04/17/2011 [facility] Patient Information 2014 [place], [place]. Hypertension Hypertension is another name for high blood pressure. High blood pressure may mean that your heart needs to work harder to pump blood. Blood pressure consists of two numbers, which includes a higher number over a lower number (example: 110/72). HOME CARE   Make lifestyle changes as told by your doctor. This may include weight loss and exercise.  Take your blood pressure medicine every day.  Limit how much salt you use.  Stop smoking if you smoke.  Do not use drugs.  Talk to your doctor if you are using decongestants or birth control pills. These medicines might make blood pressure higher.  Females should not drink more than 1 alcoholic drink per day. Males should not drink more than 2 alcoholic drinks per day.  See your doctor as told. GET HELP RIGHT AWAY IF:   You have a blood pressure reading with a top number of 180 or higher.  You get a very bad headache.  You get blurred or changing vision.  You feel confused.  You feel weak, numb, or faint.  You get chest or belly (abdominal) pain.  You throw up (vomit).  You cannot breathe very well. MAKE SURE YOU:   Understand these instructions.  Will watch your condition.  Will get help right away if you are not doing well or get worse. Document Released: 10/14/2007 Document Revised: 07/20/2011 Document Reviewed: 10/14/2007 [facility] Patient Information 2014 [place],  [place].

## 2013-06-05 NOTE — Progress Notes (Signed)
Pre-visit discussion using our clinic review tool. No additional management support is needed unless otherwise documented below in the visit note.  

## 2013-06-05 NOTE — Telephone Encounter (Signed)
The pharmacy is out of the cough syrup called in.  It is on back order.  What else could they use?

## 2013-06-05 NOTE — Progress Notes (Signed)
Subjective:    Patient ID: Abigail [name], female    DOB: 02-21-1971, 43 y.o.   MRN: [id number]  Headache  This is a new problem. The current episode started in the past 7 days. The problem occurs intermittently. The problem has been unchanged. The pain is located in the frontal region. The pain radiates to the face. The quality of the pain is described as throbbing (pressure). The pain is at a severity of 5/10. The pain is mild. Associated symptoms include coughing, rhinorrhea and sinus pressure. Pertinent negatives include no abdominal pain, blurred vision, eye pain, eye watering, facial sweating, fever, loss of balance, nausea, neck pain, phonophobia, photophobia, tinnitus or vomiting. The symptoms are aggravated by coughing and weather changes. Treatments tried: OTC sinus med. The treatment provided mild relief. Her past medical history is significant for hypertension and sinus disease. There is no history of recent head traumas.    Past Medical History  Diagnosis Date  . S/P LEEP 1992  . Headache(784.0)   . Anxiety   . GERD (gastroesophageal reflux disease)     omeprazole  . Fibroids   . Anemia     iron supp  . Depression   . Hypertension 2012    Maxide    Review of Systems  Constitutional: Negative for fever.  HENT: Positive for rhinorrhea and sinus pressure. Negative for tinnitus.   Eyes: Negative for blurred vision, photophobia and pain.  Respiratory: Positive for cough.   Gastrointestinal: Negative for nausea, vomiting and abdominal pain.  Musculoskeletal: Negative for neck pain.  Neurological: Positive for headaches. Negative for loss of balance.       Objective:   Physical Exam BP 142/84  Pulse 85  Temp(Src) 98.1 F (36.7 C) (Oral)  Wt 199 lb 9.6 oz (90.538 kg)  SpO2 97%  LMP 01/18/2012 Wt Readings from Last 3 Encounters:  06/05/13 199 lb 9.6 oz (90.538 kg)  03/02/13 195 lb (88.451 kg)  09/02/12 197 lb (89.359 kg)   Constitutional: She appears  well-developed and well-nourished. No distress.  HENT: Head: Normocephalic and atraumatic. Ears: B TMs ok, no erythema or effusion; Nose: Nose normal. Mouth/Throat: Oropharynx is clear and moist. No oropharyngeal exudate.  Eyes: Conjunctivae and EOM are normal. Pupils are equal, round, and reactive to light. No scleral icterus.  Neck: Normal range of motion. Neck supple. No JVD present. No thyromegaly present.  Cardiovascular: Normal rate, regular rhythm and normal heart sounds.  No murmur heard. No BLE edema. Pulmonary/Chest: Effort normal and breath sounds normal. No respiratory distress. She has no wheezes.  Neurological: She is alert and oriented to person, place, and time. No cranial nerve deficit. Coordination, balance, strength, speech and gait are normal.  Skin: Skin is warm and dry. No rash noted. No erythema.  Psychiatric: She has a normal mood and affect. Her behavior is normal. Judgment and thought content normal.   Lab Results  Component Value Date   WBC 7.6 06/17/2012   HGB 13.0 06/17/2012   HCT 38.8 06/17/2012   PLT 368.0 06/17/2012   GLUCOSE 99 06/17/2012   CHOL 198 06/17/2012   TRIG 156.0* 06/17/2012   HDL 43.20 06/17/2012   LDLCALC 124* 06/17/2012   ALT 21 01/13/2012   AST 26 01/13/2012   NA 138 06/17/2012   K 3.8 06/17/2012   CL 102 06/17/2012   CREATININE 0.7 06/17/2012   BUN 11 06/17/2012   CO2 30 06/17/2012   HGBA1C 6.1 06/17/2012   CT brain 2013 - no abnormality  Assessment & Plan:   Headache, sinus x 72h No concerning hx or neuro deficits on exam  Also suspect tension type, exacerbated by anxiety/stress  Promethazine VC prn, muscle relaxer HS and daily OTC antihistamine +nasal steroid

## 2013-06-06 ENCOUNTER — Telehealth: Payer: Self-pay | Admitting: Internal Medicine

## 2013-06-06 NOTE — Telephone Encounter (Signed)
Relevant patient education assigned to patient using Emmi. ° °

## 2013-06-06 NOTE — Telephone Encounter (Signed)
Pt is aware.  

## 2014-03-09 ENCOUNTER — Ambulatory Visit: Payer: 59 | Admitting: Nurse Practitioner

## 2014-03-12 ENCOUNTER — Encounter: Payer: Self-pay | Admitting: Internal Medicine

## 2016-01-24 ENCOUNTER — Emergency Department (HOSPITAL_COMMUNITY)
Admission: EM | Admit: 2016-01-24 | Discharge: 2016-01-24 | Disposition: A | Discharge status: Home / Self Care | Payer: Self-pay | Attending: Emergency Medicine | Admitting: Emergency Medicine

## 2016-01-24 ENCOUNTER — Emergency Department (HOSPITAL_COMMUNITY): Payer: Self-pay

## 2016-01-24 ENCOUNTER — Encounter (HOSPITAL_COMMUNITY): Payer: Self-pay | Admitting: Emergency Medicine

## 2016-01-24 DIAGNOSIS — R091 Pleurisy: Secondary | ICD-10-CM | POA: Insufficient documentation

## 2016-01-24 DIAGNOSIS — I10 Essential (primary) hypertension: Secondary | ICD-10-CM | POA: Insufficient documentation

## 2016-01-24 LAB — BASIC METABOLIC PANEL
ANION GAP: 8 (ref 5–15)
BUN: 10 mg/dL (ref 6–20)
CO2: 27 mmol/L (ref 22–32)
CREATININE: 0.85 mg/dL (ref 0.44–1.00)
Calcium: 9.2 mg/dL (ref 8.9–10.3)
Chloride: 102 mmol/L (ref 101–111)
GFR calc Af Amer: >60 mL/min (ref >60)
GFR calc non Af Amer: >60 mL/min (ref >60)
Glucose, Bld: 99 mg/dL (ref 65–99)
Potassium: 3.6 mmol/L (ref 3.5–5.1)
Sodium: 137 mmol/L (ref 135–145)

## 2016-01-24 LAB — I-STAT TROPONIN, ED: Troponin i, poc: 0 ng/mL (ref 0.00–0.08)

## 2016-01-24 LAB — CBC
HCT: 42.6 % (ref 36.0–46.0)
Hemoglobin: 14.1 g/dL (ref 12.0–15.0)
MCH: 30.4 pg (ref 26.0–34.0)
MCHC: 33.1 g/dL (ref 30.0–36.0)
MCV: 91.8 fL (ref 78.0–100.0)
PLATELETS: 294 10*3/uL (ref 150–400)
RBC: 4.64 MIL/uL (ref 3.87–5.11)
RDW: 12.9 % (ref 11.5–15.5)
WBC: 6.9 10*3/uL (ref 4.0–10.5)

## 2016-01-24 MED ORDER — NAPROXEN 250 MG PO TABS
500.0000 mg | ORAL_TABLET | Freq: Once | ORAL | Status: AC
  Administered 2016-01-24: 500 mg via ORAL
  Filled 2016-01-24: qty 2

## 2016-01-24 MED ORDER — NAPROXEN 500 MG PO TABS
500.0000 mg | ORAL_TABLET | Freq: Two times a day (BID) | ORAL | 0 refills | Status: DC
Start: 2016-01-24 — End: 2016-08-20

## 2016-01-24 NOTE — ED Provider Notes (Signed)
[facility] DEPT Provider Note   CSN: [id number] Arrival date & time: 01/24/16  0500     History   Chief Complaint Chief Complaint  Patient presents with  . Chest Pain    HPI Abigail [name] is a 45 y.o. female.  HPI Pt comes in with cc of chest pain. Chest pain is L sided, sharp pain that started 4- 5 days ago. Pain is intermittent. Pain is worse with deep inspiration and certain positions. Pain is not exertional. Pain is located in the upper L side and is focal, not radiating.  Pt's pain typically is reproduced with deep inspiration. Pt denies cough or wheezing. No hx of lung dz, no smoking hx and pt has no premature CAD.  Pt does indicate that her chest pain is worse laying flat vs. Sitting up. She reports that in the pasty her sed rate was elevated, and they worked her up for SLE - and the eventual results were neg. PT was having some chest discomfort at that time, but it was different.  Pt has no hx of PE, DVT and denies any exogenous estrogen use, long distance travels or surgery in the past 6 weeks, active cancer, recent immobilization.   Past Medical History:  Diagnosis Date  . Anemia    iron supp  . Anxiety   . Depression   . Fibroids   . GERD (gastroesophageal reflux disease)    omeprazole  . Headache(784.0)   . Hypertension 2012   Maxide  . S/P LEEP 1992    Patient Active Problem List   Diagnosis Date Noted  . Environmental allergies 06/17/2012  . S/p Abdominal Supracervical Hysterectomy, Left Salpingoohorectomy, Right Salpingectomy on 01/18/12 01/18/2012  . HTN (hypertension) 10/29/2011    Past Surgical History:  Procedure Laterality Date  . LEEP  1998   Abnormal pap  . SUPRACERVICAL ABDOMINAL HYSTERECTOMY  01/18/2012   Procedure: HYSTERECTOMY SUPRACERVICAL ABDOMINAL with LSO;  Surgeon: Osborne Oman, MD;  Location: [facility];  Service: Gynecology;  Laterality: N/A;  . TUBAL LIGATION  1997  . WISDOM TOOTH EXTRACTION      OB History    Gravida Para  Term Preterm AB Living   2 2 2     2    SAB TAB Ectopic Multiple Live Births           2       Home Medications    Prior to Admission medications   Medication Sig Start Date End Date Taking? Authorizing Provider  fexofenadine (ALLEGRA) 180 MG tablet Take 1 tablet (180 mg total) by mouth daily. 06/05/13   Rowe Clack, MD  fluticasone (FLONASE) 50 MCG/ACT nasal spray Place 1 spray into both nostrils daily. 06/05/13   Rowe Clack, MD  metroNIDAZOLE (METROGEL) 0.75 % vaginal gel INSERT 1 APPLICATORFUL VAGINALLY AT BEDTIME FOR 10 DAYS THEN TWICE A WEEK FOR 6 MONTHS 04/27/13   Osborne Oman, MD  naproxen (NAPROSYN) 500 MG tablet Take 1 tablet (500 mg total) by mouth 2 (two) times daily. 01/24/16   Varney Biles, MD  omeprazole (PRILOSEC OTC) 20 MG tablet Take 20 mg by mouth daily as needed. For heartburn    Historical Provider, MD  promethazine-dextromethorphan (PROMETHAZINE-DM) 6.25-15 MG/5ML syrup Take 5 mLs by mouth 4 (four) times daily as needed for cough. 06/05/13   Rowe Clack, MD  promethazine-phenylephrine (PROMETHAZINE-PHENYLEPHRINE) 6.25-5 MG/5ML SYRP Take 5 mLs by mouth every 4 (four) hours as needed for congestion. 06/05/13   Rowe Clack, MD  triamterene-hydrochlorothiazide (MAXZIDE-25) 37.5-25 MG per tablet Take 1 tablet by mouth daily. 06/05/13   Rowe Clack, MD    Family History Family History  Problem Relation Age of Onset  . Hypertension Father   . Depression Father   . Diabetes Father   . Alcohol abuse Father   . Diabetes Sister   . Diabetes Brother   . Hypertension Mother   . Arthritis Mother   . Hypertension Sister     Social History Social History  Substance Use Topics  . Smoking status: Never Smoker  . Smokeless tobacco: Never Used  . Alcohol use 0.5 oz/week    1 Standard drinks or equivalent per week     Comment: occasi     Allergies   Tylox [oxycodone-acetaminophen]   Review of Systems Review of Systems  ROS 10  Systems reviewed and are negative for acute change except as noted in the HPI.    Physical Exam Updated Vital Signs BP 139/94   Pulse 76   Temp 98.5 F (36.9 C) (Oral)   Resp 12   Ht 5\' 6"  (1.676 m)   Wt 189 lb (85.7 kg)   LMP 01/18/2012   SpO2 98%   BMI 30.51 kg/m   Physical Exam  Constitutional: She is oriented to person, place, and time. She appears well-developed.  HENT:  Head: Normocephalic and atraumatic.  Eyes: EOM are normal.  Neck: Normal range of motion. Neck supple. No JVD present.  Cardiovascular: Normal rate and intact distal pulses.   Pulmonary/Chest: Effort normal. No respiratory distress. She has no wheezes.  Abdominal: Bowel sounds are normal.  Neurological: She is alert and oriented to person, place, and time.  Skin: Skin is warm and dry.  Nursing note and vitals reviewed.    ED Treatments / Results  Labs (all labs ordered are listed, but only abnormal results are displayed) Labs Reviewed  [facility], ED    EKG  EKG Interpretation  Date/Time:  Friday January 24 2016 05:04:59 EDT Ventricular Rate:  84 PR Interval:    QRS Duration: 78 QT Interval:  364 QTC Calculation: 431 R Axis:   60 Text Interpretation:  Sinus rhythm No acute changes No significant change since last tracing Confirmed by Kathrynn Humble, MD, Thelma Comp [phone]) on 01/24/2016 5:17:56 AM Also confirmed by Kathrynn Humble, MD, Thelma Comp [phone]), editor [place]  CCT, [place] ([id number])  on 01/24/2016 7:18:41 AM       Radiology Dg Chest 2 View  Result Date: 01/24/2016 CLINICAL DATA:  Left-sided chest pain, pleuritic.  Duration 2 days EXAM: CHEST  2 VIEW COMPARISON:  None. FINDINGS: The lungs are clear. The pulmonary vasculature is normal. Heart size is normal. Hilar and mediastinal contours are unremarkable. There is no pleural effusion. IMPRESSION: No active cardiopulmonary disease. Electronically Signed   By: Andreas Newport M.D.   On: 01/24/2016 05:32     Procedures Procedures (including critical care time)  Medications Ordered in ED Medications  naproxen (NAPROSYN) tablet 500 mg (500 mg Oral Given 01/24/16 0600)     Initial Impression / Assessment and Plan / ED Course  I have reviewed the triage vital signs and the nursing notes.  Pertinent labs & imaging results that were available during my care of the patient were reviewed by me and considered in my medical decision making (see chart for details).  Clinical Course    Pt comes in with cc of chest pain. Chest pain is pleuritic, and worse when supine. However,  pt's EKG doesn't show any signs of pericarditis. Pt has no hx of PE, DVT and denies any exogenous estrogen use, long distance travels or surgery in the past 6 weeks, active cancer, recent immobilization. Pt is PERC neg. She does report that she has been evaluated in the past for SLE but the workup was negative.  Final Clinical Impressions(s) / ED Diagnoses   Final diagnoses:  Pleurisy    New Prescriptions Discharge Medication List as of 01/24/2016  6:30 AM       Varney Biles, MD 01/25/16 1101

## 2016-01-24 NOTE — Discharge Instructions (Signed)
We saw you in the ER for the chest pain. All of our cardiac workup is normal, including labs, EKG and chest X-RAY are normal. We are not sure what is causing your discomfort, but we feel comfortable sending you home at this time. The workup in the ER is not complete, and you should follow up with your primary care doctor for further evaluation.  Please return to the ER if you have worsening chest pain, shortness of breath, pain radiating to your jaw, shoulder, or back, sweats or fainting.   Take the naprosyn as prescribed.

## 2016-01-24 NOTE — ED Triage Notes (Signed)
Per EMS, pt from home with c/o left sided chest pain x 4 days. Pt denies shortness of breath, n,v. BP-157/101, HR-90

## 2016-03-06 ENCOUNTER — Ambulatory Visit: Payer: Self-pay | Admitting: Nurse Practitioner

## 2016-04-07 ENCOUNTER — Other Ambulatory Visit (INDEPENDENT_AMBULATORY_CARE_PROVIDER_SITE_OTHER): Payer: BLUE CROSS/BLUE SHIELD

## 2016-04-07 ENCOUNTER — Encounter: Payer: Self-pay | Admitting: Nurse Practitioner

## 2016-04-07 ENCOUNTER — Ambulatory Visit (INDEPENDENT_AMBULATORY_CARE_PROVIDER_SITE_OTHER): Payer: BLUE CROSS/BLUE SHIELD | Admitting: Nurse Practitioner

## 2016-04-07 VITALS — BP 132/84 | HR 76 | Temp 98.1°F | Ht 66.0 in | Wt 196.0 lb

## 2016-04-07 DIAGNOSIS — F411 Generalized anxiety disorder: Secondary | ICD-10-CM

## 2016-04-07 DIAGNOSIS — R739 Hyperglycemia, unspecified: Secondary | ICD-10-CM

## 2016-04-07 LAB — HEPATIC FUNCTION PANEL
ALBUMIN: 3.7 g/dL (ref 3.5–5.2)
ALK PHOS: 36 U/L — AB (ref 39–117)
ALT: 21 U/L (ref 0–35)
AST: 34 U/L (ref 0–37)
Bilirubin, Direct: 0 mg/dL (ref 0.0–0.3)
TOTAL PROTEIN: 6.5 g/dL (ref 6.0–8.3)
Total Bilirubin: 0.3 mg/dL (ref 0.2–1.2)

## 2016-04-07 LAB — HEMOGLOBIN A1C: Hgb A1c MFr Bld: 6.1 % (ref 4.6–6.5)

## 2016-04-07 LAB — TSH: TSH: 0.52 u[IU]/mL (ref 0.35–4.50)

## 2016-04-07 MED ORDER — SERTRALINE HCL 25 MG PO TABS: 25.0000 mg | ORAL_TABLET | Freq: Every day | ORAL | 1 refills | Status: DC

## 2016-04-07 NOTE — Patient Instructions (Signed)
I instructed pt to start 1/2 tablet once daily for 1 week and then increase to a full tablet once daily on week two as tolerated.   We discussed common side effects such as nausea, drowsiness and weight gain.  Also discussed rare but serious side effect of suicide ideation.  She is instructed to discontinue medication go directly to ED if this occurs.  Pt verbalizes understanding.   Plan follow up in 1month to evaluate progress.     

## 2016-04-07 NOTE — Progress Notes (Signed)
Pre visit review using our clinic review tool, if applicable. No additional management support is needed unless otherwise documented below in the visit note. 

## 2016-04-07 NOTE — Progress Notes (Signed)
Subjective:    Patient ID: Abigail [name], female    DOB: [birth year], 45 y.o.   MRN: [id number]  Patient presents today for complete physical or establish care (new patient)   Anxiety  Presents for initial visit. Onset was 1 to 4 weeks ago. The problem has been gradually worsening. Symptoms include chest pain, depressed mood, hyperventilation, muscle tension, nervous/anxious behavior, palpitations and restlessness. Patient reports no compulsions, confusion, decreased concentration, dizziness, dry mouth, feeling of choking, insomnia, malaise, nausea, obsessions, shortness of breath or suicidal ideas.   There are no known risk factors. Her past medical history is significant for anxiety/panic attacks and depression.    Immunizations: (TDAP, Hep C screen, Pneumovax, Influenza, zoster)  Health Maintenance  Topic Date Due  . Pap Smear  09/09/2014  . Tetanus Vaccine  05/12/2015  . Flu Shot  12/10/2015  . HIV Screening  04/12/2017*  *Topic was postponed. The date shown is not the original due date.   Diet:none Weight:  Wt Readings from Last 3 Encounters:  04/07/16 196 lb (88.9 kg)  01/24/16 189 lb (85.7 kg)  06/05/13 199 lb 9.6 oz (90.5 kg)    Depression/Suicide: Depression screen PHQ 2/9 06/17/2012  Decreased Interest 0  Down, Depressed, Hopeless 0  PHQ - 2 Score 0   Advanced Directive: Advanced Directives 01/24/2016  Does Patient Have a Medical Advance Directive? No  Would patient like information on creating a medical advance directive? No - patient declined information    Medications and allergies reviewed with patient and updated if appropriate.  Patient Active Problem List   Diagnosis Date Noted  . Environmental allergies 06/17/2012  . S/p Abdominal Supracervical Hysterectomy, Left Salpingoohorectomy, Right Salpingectomy on 01/18/12 01/18/2012  . HTN (hypertension) 10/29/2011    Current Outpatient Prescriptions on File Prior to Visit  Medication Sig Dispense Refill  .  fexofenadine (ALLEGRA) 180 MG tablet Take 1 tablet (180 mg total) by mouth daily. 90 tablet 1  . fluticasone (FLONASE) 50 MCG/ACT nasal spray Place 1 spray into both nostrils daily. 16 g 2  . naproxen (NAPROSYN) 500 MG tablet Take 1 tablet (500 mg total) by mouth 2 (two) times daily. 30 tablet 0  . omeprazole (PRILOSEC OTC) 20 MG tablet Take 20 mg by mouth daily as needed. For heartburn    . triamterene-hydrochlorothiazide (MAXZIDE-25) 37.5-25 MG per tablet Take 1 tablet by mouth daily. 30 tablet 2   No current facility-administered medications on file prior to visit.     Past Medical History:  Diagnosis Date  . Anemia    iron supp  . Anxiety   . Depression   . Fibroids   . GERD (gastroesophageal reflux disease)    omeprazole  . Headache(784.0)   . Hypertension 2012   Maxide  . S/P LEEP 1992    Past Surgical History:  Procedure Laterality Date  . LEEP  1998   Abnormal pap  . SUPRACERVICAL ABDOMINAL HYSTERECTOMY  01/18/2012   Procedure: HYSTERECTOMY SUPRACERVICAL ABDOMINAL with LSO;  Surgeon: Osborne Oman, MD;  Location: [facility];  Service: Gynecology;  Laterality: N/A;  . TUBAL LIGATION  1997  . WISDOM TOOTH EXTRACTION      Social History   Social History  . Marital status: Married    Spouse name: N/A  . Number of children: N/A  . Years of education: 82   Occupational History  . MEDICAL ASSISTANT [employer]   Social History Main Topics  . Smoking status: Never Smoker  . Smokeless tobacco: Never  Used  . Alcohol use 0.5 oz/week    1 Standard drinks or equivalent per week     Comment: occasi  . Drug use: No  . Sexual activity: Yes    Birth control/ protection: Surgical   Other Topics Concern  . None   Social History Narrative   Regular exercise-no   Caffeine Use-yes    Family History  Problem Relation Age of Onset  . Hypertension Father   . Depression Father   . Diabetes Father   . Alcohol abuse Father   . Diabetes Sister   . Diabetes Brother    . Hypertension Mother   . Arthritis Mother   . Hypertension Sister   . Cancer Paternal Grandmother     breast        Review of Systems  Constitutional: Negative for fever, malaise/fatigue and weight loss.  HENT: Negative for congestion and sore throat.   Eyes:       Negative for visual changes  Respiratory: Negative for cough and shortness of breath.   Cardiovascular: Positive for chest pain and palpitations. Negative for leg swelling.  Gastrointestinal: Negative for blood in stool, constipation, diarrhea, heartburn and nausea.  Genitourinary: Negative for dysuria, frequency and urgency.  Musculoskeletal: Negative for falls, joint pain and myalgias.  Skin: Negative for rash.  Neurological: Negative for dizziness, sensory change and headaches.  Endo/Heme/Allergies: Does not bruise/bleed easily.  Psychiatric/Behavioral: Negative for confusion, decreased concentration, depression, substance abuse and suicidal ideas. The patient is nervous/anxious. The patient does not have insomnia.    Recent Results (from the past 2160 hour(s))  Basic metabolic panel     Status: None   Collection Time: 01/24/16  5:09 AM  Result Value Ref Range   Sodium 137 135 - 145 mmol/L   Potassium 3.6 3.5 - 5.1 mmol/L   Chloride 102 101 - 111 mmol/L   CO2 27 22 - 32 mmol/L   Glucose, Bld 99 65 - 99 mg/dL   BUN 10 6 - 20 mg/dL   Creatinine, Ser 0.85 0.44 - 1.00 mg/dL   Calcium 9.2 8.9 - 10.3 mg/dL   GFR calc non Af Amer >60 >60 mL/min   GFR calc Af Amer >60 >60 mL/min    Comment: (NOTE) The eGFR has been calculated using the CKD EPI equation. This calculation has not been validated in all clinical situations. eGFR's persistently <60 mL/min signify possible Chronic Kidney Disease.    Anion gap 8 5 - 15  CBC     Status: None   Collection Time: 01/24/16  5:09 AM  Result Value Ref Range   WBC 6.9 4.0 - 10.5 K/uL   RBC 4.64 3.87 - 5.11 MIL/uL   Hemoglobin 14.1 12.0 - 15.0 g/dL   HCT 42.6 36.0 - 46.0  %   MCV 91.8 78.0 - 100.0 fL   MCH 30.4 26.0 - 34.0 pg   MCHC 33.1 30.0 - 36.0 g/dL   RDW 12.9 11.5 - 15.5 %   Platelets 294 150 - 400 K/uL  I-stat troponin, ED     Status: None   Collection Time: 01/24/16  5:19 AM  Result Value Ref Range   Troponin i, poc 0.00 0.00 - 0.08 ng/mL   Comment 3            Comment: Due to the release kinetics of cTnI, a negative result within the first hours of the onset of symptoms does not rule out myocardial infarction with certainty. If myocardial infarction is still suspected,  repeat the test at appropriate intervals.   TSH     Status: None   Collection Time: 04/07/16  4:04 PM  Result Value Ref Range   TSH 0.52 0.35 - 4.50 uIU/mL  Hemoglobin A1c     Status: None   Collection Time: 04/07/16  4:04 PM  Result Value Ref Range   Hgb A1c MFr Bld 6.1 4.6 - 6.5 %    Comment: Glycemic Control Guidelines for People with Diabetes:Non Diabetic:  <6%Goal of Therapy: <7%Additional Action Suggested:  >8%   Hepatic function panel     Status: Abnormal   Collection Time: 04/07/16  4:04 PM  Result Value Ref Range   Total Bilirubin 0.3 0.2 - 1.2 mg/dL   Bilirubin, Direct 0.0 0.0 - 0.3 mg/dL   Alkaline Phosphatase 36 (L) 39 - 117 U/L   AST 34 0 - 37 U/L   ALT 21 0 - 35 U/L   Total Protein 6.5 6.0 - 8.3 g/dL   Albumin 3.7 3.5 - 5.2 g/dL   Objective:   Vitals:   04/07/16 1507  BP: 132/84  Pulse: 76  Temp: 98.1 F (36.7 C)    Body mass index is 31.64 kg/m.   Physical Examination:  Physical Exam  Constitutional: She is oriented to person, place, and time and well-developed, well-nourished, and in no distress. No distress.  HENT:  Right Ear: External ear normal.  Left Ear: External ear normal.  Nose: Nose normal.  Mouth/Throat: Oropharynx is clear and moist. No oropharyngeal exudate.  Eyes: Conjunctivae and EOM are normal. Pupils are equal, round, and reactive to light. No scleral icterus.  Neck: Normal range of motion. Neck supple. No thyromegaly  present.  Cardiovascular: Normal rate, normal heart sounds and intact distal pulses.   Pulmonary/Chest: Effort normal and breath sounds normal. She exhibits no tenderness.  Abdominal: Soft. Bowel sounds are normal. She exhibits no distension. There is no tenderness.  Musculoskeletal: Normal range of motion. She exhibits no edema or tenderness.  Lymphadenopathy:    She has no cervical adenopathy.  Neurological: She is alert and oriented to person, place, and time. Gait normal.  Skin: Skin is warm and dry.  Psychiatric: Affect and judgment normal.    ASSESSMENT and PLAN:  Diagnoses and all orders for this visit:  Generalized anxiety disorder -     TSH; Future -     sertraline (ZOLOFT) 25 MG tablet; Take 1 tablet (25 mg total) by mouth daily.  Hyperglycemia -     Hepatic function panel -     Hemoglobin A1C    No problem-specific Assessment & Plan notes found for this encounter.     Follow up: Return in about 1 month (around 05/07/2016) for anxiety.  Wilfred Lacy, NP

## 2016-04-09 ENCOUNTER — Encounter: Payer: Self-pay | Admitting: Nurse Practitioner

## 2016-04-09 NOTE — Progress Notes (Signed)
Normal results, see office note

## 2016-04-10 ENCOUNTER — Other Ambulatory Visit: Payer: Self-pay | Admitting: Nurse Practitioner

## 2016-04-10 DIAGNOSIS — E119 Type 2 diabetes mellitus without complications: Secondary | ICD-10-CM | POA: Insufficient documentation

## 2016-04-10 DIAGNOSIS — F411 Generalized anxiety disorder: Secondary | ICD-10-CM | POA: Insufficient documentation

## 2016-04-10 DIAGNOSIS — K219 Gastro-esophageal reflux disease without esophagitis: Secondary | ICD-10-CM | POA: Insufficient documentation

## 2016-04-10 DIAGNOSIS — I10 Essential (primary) hypertension: Secondary | ICD-10-CM

## 2016-04-10 DIAGNOSIS — R739 Hyperglycemia, unspecified: Secondary | ICD-10-CM | POA: Insufficient documentation

## 2016-04-10 MED ORDER — TRIAMTERENE-HCTZ 37.5-25 MG PO TABS: 1.0000 | ORAL_TABLET | Freq: Every day | ORAL | 2 refills | Status: DC

## 2016-04-10 MED ORDER — OMEPRAZOLE MAGNESIUM 20 MG PO TBEC: 20.0000 mg | DELAYED_RELEASE_TABLET | Freq: Every day | ORAL | 3 refills | Status: DC | PRN

## 2016-05-08 ENCOUNTER — Ambulatory Visit: Payer: BLUE CROSS/BLUE SHIELD | Admitting: Nurse Practitioner

## 2016-05-22 ENCOUNTER — Other Ambulatory Visit: Payer: BLUE CROSS/BLUE SHIELD

## 2016-05-22 ENCOUNTER — Ambulatory Visit (INDEPENDENT_AMBULATORY_CARE_PROVIDER_SITE_OTHER): Payer: BLUE CROSS/BLUE SHIELD | Admitting: Nurse Practitioner

## 2016-05-22 ENCOUNTER — Encounter: Payer: Self-pay | Admitting: Nurse Practitioner

## 2016-05-22 VITALS — BP 126/64 | HR 85 | Temp 98.5°F | Resp 14 | Ht 66.0 in | Wt 198.0 lb

## 2016-05-22 DIAGNOSIS — F411 Generalized anxiety disorder: Secondary | ICD-10-CM

## 2016-05-22 DIAGNOSIS — R55 Syncope and collapse: Secondary | ICD-10-CM | POA: Diagnosis not present

## 2016-05-22 DIAGNOSIS — T781XXA Other adverse food reactions, not elsewhere classified, initial encounter: Secondary | ICD-10-CM

## 2016-05-22 DIAGNOSIS — I10 Essential (primary) hypertension: Secondary | ICD-10-CM | POA: Diagnosis not present

## 2016-05-22 DIAGNOSIS — K219 Gastro-esophageal reflux disease without esophagitis: Secondary | ICD-10-CM

## 2016-05-22 DIAGNOSIS — R002 Palpitations: Secondary | ICD-10-CM

## 2016-05-22 LAB — THYROID PANEL WITH TSH
FREE THYROXINE INDEX: 1.9 (ref 1.4–3.8)
T3 UPTAKE: 27 % (ref 22–35)
T4, Total: 7.1 ug/dL (ref 4.5–12.0)
TSH: 0.35 mIU/L — ABNORMAL LOW

## 2016-05-22 MED ORDER — TRIAMTERENE-HCTZ 37.5-25 MG PO TABS: 1.0000 | ORAL_TABLET | Freq: Every day | ORAL | 3 refills | Status: DC

## 2016-05-22 MED ORDER — SERTRALINE HCL 25 MG PO TABS: 50.0000 mg | ORAL_TABLET | Freq: Every day | ORAL | 3 refills | Status: DC

## 2016-05-22 MED ORDER — FLUTICASONE PROPIONATE 50 MCG/ACT NA SUSP: 1.0000 | Freq: Every day | NASAL | 2 refills | Status: DC

## 2016-05-22 MED ORDER — OMEPRAZOLE MAGNESIUM 20 MG PO TBEC: 20.0000 mg | DELAYED_RELEASE_TABLET | Freq: Every day | ORAL | 3 refills | Status: DC | PRN

## 2016-05-22 NOTE — Patient Instructions (Signed)
Palpitations A palpitation is the feeling that your heartbeat is irregular or is faster than normal. It may feel like your heart is fluttering or skipping a beat. Palpitations are usually not a serious problem. They may be caused by many things, including smoking, caffeine, alcohol, stress, and certain medicines. Although most causes of palpitations are not serious, palpitations can be a sign of a serious medical problem. In some cases, you may need further medical evaluation. Follow these instructions at home: Pay attention to any changes in your symptoms. Take these actions to help with your condition:  Avoid the following: ? Caffeinated coffee, tea, soft drinks, diet pills, and energy drinks. ? Chocolate. ? Alcohol.  Do not use any tobacco products, such as cigarettes, chewing tobacco, and e-cigarettes. If you need help quitting, ask your health care provider.  Try to reduce your stress and anxiety. Things that can help you relax include: ? Yoga. ? Meditation. ? Physical activity, such as swimming, jogging, or walking. ? Biofeedback. This is a method that helps you learn to use your mind to control things in your body, such as your heartbeats.  Get plenty of rest and sleep.  Take over-the-counter and prescription medicines only as told by your health care provider.  Keep all follow-up visits as told by your health care provider. This is important.  Contact a health care provider if:  You continue to have a fast or irregular heartbeat after 24 hours.  Your palpitations occur more often. Get help right away if:  You have chest pain or shortness of breath.  You have a severe headache.  You feel dizzy or you faint. This information is not intended to replace advice given to you by your health care provider. Make sure you discuss any questions you have with your health care provider. Document Released: 04/24/2000 Document Revised: 09/30/2015 Document Reviewed: 01/10/2015 Elsevier  Interactive Patient Education  2017 Elsevier Inc.  

## 2016-05-22 NOTE — Progress Notes (Signed)
Pre visit review using our clinic review tool, if applicable. No additional management support is needed unless otherwise documented below in the visit note. 

## 2016-05-22 NOTE — Progress Notes (Signed)
Subjective:  Patient ID: Abigail [name], female    DOB: 06-06-1970  Age: 46 y.o. MRN: [id number]  CC: Follow-up (started zoloft - patients says she is still having episodes of anxiety)   Allergic Reaction  This is a recurrent problem. The current episode started more than 1 week ago. The problem occurs intermittently. The problem has been waxing and waning since onset. The problem is severe. It is unknown what she was exposed to. The time of exposure was just prior to onset (within 1hour of eating (greens, seafood)). The exposure occurred at work and home. Associated symptoms include globus sensation, hyperventilation, itching and a rash. Pertinent negatives include no abdominal pain, chest pain, chest pressure, coughing or vomiting. Swelling is present on the face. Past treatments include oral corticosteroid and diphenhydramine. The treatment provided significant relief. Her past medical history is significant for food allergies and seasonal allergies.  Anxiety  Presents for follow-up visit. Symptoms include hyperventilation, muscle tension, nausea, nervous/anxious behavior, palpitations and panic. Patient reports no chest pain, dizziness or shortness of breath. Symptoms occur occasionally. The most recent episode lasted 5 minutes. The severity of symptoms is moderate. The quality of sleep is fair. Nighttime awakenings: occasional.   Her past medical history is significant for anxiety/panic attacks. There is no history of anemia or hyperthyroidism. Compliance with medications is 76-100%. Treatment side effects: none.  Palpitations   This is a new problem. The current episode started more than 1 month ago. The problem has been waxing and waning. On average, each episode lasts 5 minutes. Nothing aggravates the symptoms. Associated symptoms include anxiety, an irregular heartbeat, nausea and near-syncope. Pertinent negatives include no chest fullness, chest pain, coughing, diaphoresis, dizziness, fever,  malaise/fatigue, numbness, shortness of breath, syncope, vomiting or weakness. She has tried breathing exercises and bed rest (zoloft) for the symptoms. The treatment provided significant relief. There are no known risk factors. Her past medical history is significant for anxiety. There is no history of anemia, drug use, heart disease, hyperthyroidism or a valve disorder.  reports some improvement in symptoms with zoloft.  Outpatient Medications Prior to Visit  Medication Sig Dispense Refill  . fexofenadine (ALLEGRA) 180 MG tablet Take 1 tablet (180 mg total) by mouth daily. 90 tablet 1  . fluticasone (FLONASE) 50 MCG/ACT nasal spray Place 1 spray into both nostrils daily. 16 g 2  . omeprazole (PRILOSEC OTC) 20 MG tablet Take 1 tablet (20 mg total) by mouth daily as needed. For heartburn 30 tablet 3  . sertraline (ZOLOFT) 25 MG tablet Take 1 tablet (25 mg total) by mouth daily. 30 tablet 1  . triamterene-hydrochlorothiazide (MAXZIDE-25) 37.5-25 MG tablet Take 1 tablet by mouth daily. 30 tablet 2  . naproxen (NAPROSYN) 500 MG tablet Take 1 tablet (500 mg total) by mouth 2 (two) times daily. (Patient not taking: Reported on 05/22/2016) 30 tablet 0   No facility-administered medications prior to visit.     ROS See HPI  Objective:  BP 126/64   Pulse 85   Temp 98.5 F (36.9 C) (Oral)   Resp 14   Ht 5\' 6"  (1.676 m)   Wt 198 lb (89.8 kg)   LMP 01/18/2012   SpO2 98%   BMI 31.96 kg/m   BP Readings from Last 3 Encounters:  05/22/16 126/64  04/07/16 132/84  01/24/16 139/94    Wt Readings from Last 3 Encounters:  05/22/16 198 lb (89.8 kg)  04/07/16 196 lb (88.9 kg)  01/24/16 189 lb (85.7 kg)  Physical Exam  Constitutional: She is oriented to person, place, and time. No distress.  HENT:  Right Ear: External ear normal.  Left Ear: External ear normal.  Nose: Nose normal.  Mouth/Throat: No oropharyngeal exudate.  Eyes: No scleral icterus.  Neck: Normal range of motion. Neck  supple.  Cardiovascular: Normal rate, regular rhythm and normal heart sounds.   Pulmonary/Chest: Effort normal and breath sounds normal. No respiratory distress.  Musculoskeletal: Normal range of motion. She exhibits no edema.  Lymphadenopathy:    She has no cervical adenopathy.  Neurological: She is alert and oriented to person, place, and time.  Skin: Skin is warm and dry.  Psychiatric: She has a normal mood and affect. Her behavior is normal.    Lab Results  Component Value Date   WBC 6.9 01/24/2016   HGB 14.1 01/24/2016   HCT 42.6 01/24/2016   PLT 294 01/24/2016   GLUCOSE 99 01/24/2016   CHOL 198 06/17/2012   TRIG 156.0 (H) 06/17/2012   HDL 43.20 06/17/2012   LDLCALC 124 (H) 06/17/2012   ALT 21 04/07/2016   AST 34 04/07/2016   NA 137 01/24/2016   K 3.6 01/24/2016   CL 102 01/24/2016   CREATININE 0.85 01/24/2016   BUN 10 01/24/2016   CO2 27 01/24/2016   TSH 0.52 04/07/2016   HGBA1C 6.1 04/07/2016    Dg Chest 2 View  Result Date: 01/24/2016 CLINICAL DATA:  Left-sided chest pain, pleuritic.  Duration 2 days EXAM: CHEST  2 VIEW COMPARISON:  None. FINDINGS: The lungs are clear. The pulmonary vasculature is normal. Heart size is normal. Hilar and mediastinal contours are unremarkable. There is no pleural effusion. IMPRESSION: No active cardiopulmonary disease. Electronically Signed   By: Andreas Newport M.D.   On: 01/24/2016 05:32    Assessment & Plan:   Abigail [name] was seen today for follow-up.  Diagnoses and all orders for this visit:  Generalized anxiety disorder -     sertraline (ZOLOFT) 25 MG tablet; Take 2 tablets (50 mg total) by mouth daily. -     Thyroid Panel With TSH; Future  Allergic reaction to food, initial encounter -     Ambulatory referral to Allergy  Palpitations -     ECHOCARDIOGRAM COMPLETE; Future -     Thyroid Panel With TSH; Future -     VAS US CAROTID; Future  Essential hypertension -     triamterene-hydrochlorothiazide (MAXZIDE-25)  37.5-25 MG tablet; Take 1 tablet by mouth daily.  Pre-syncope -     ECHOCARDIOGRAM COMPLETE; Future -     VAS US CAROTID; Future  Gastroesophageal reflux disease, esophagitis presence not specified -     omeprazole (PRILOSEC OTC) 20 MG tablet; Take 1 tablet (20 mg total) by mouth daily as needed. For heartburn  Other orders -     fluticasone (FLONASE) 50 MCG/ACT nasal spray; Place 1 spray into both nostrils daily.   I have changed Ms. Sigala's sertraline. I am also having her maintain her fexofenadine, naproxen, triamterene-hydrochlorothiazide, omeprazole, and fluticasone.  Meds ordered this encounter  Medications  . sertraline (ZOLOFT) 25 MG tablet    Sig: Take 2 tablets (50 mg total) by mouth daily.    Dispense:  60 tablet    Refill:  3    Order Specific Question:   Supervising Provider    Answer:   Cassandria Anger [[id number]]  . triamterene-hydrochlorothiazide (MAXZIDE-25) 37.5-25 MG tablet    Sig: Take 1 tablet by mouth daily.    Dispense:  30 tablet    Refill:  3    Order Specific Question:   Supervising Provider    Answer:   Cassandria Anger [[id number]]  . omeprazole (PRILOSEC OTC) 20 MG tablet    Sig: Take 1 tablet (20 mg total) by mouth daily as needed. For heartburn    Dispense:  30 tablet    Refill:  3    Order Specific Question:   Supervising Provider    Answer:   Cassandria Anger [[id number]]  . fluticasone (FLONASE) 50 MCG/ACT nasal spray    Sig: Place 1 spray into both nostrils daily.    Dispense:  16 g    Refill:  2    Order Specific Question:   Supervising Provider    Answer:   Cassandria Anger [[id number]]    Follow-up: Return in about 3 months (around 08/20/2016) for CPE (fasting).  Wilfred Lacy, NP

## 2016-06-04 ENCOUNTER — Ambulatory Visit (INDEPENDENT_AMBULATORY_CARE_PROVIDER_SITE_OTHER): Payer: BLUE CROSS/BLUE SHIELD | Admitting: Nurse Practitioner

## 2016-06-04 ENCOUNTER — Telehealth: Payer: Self-pay

## 2016-06-04 ENCOUNTER — Encounter: Payer: Self-pay | Admitting: Nurse Practitioner

## 2016-06-04 VITALS — BP 122/84 | HR 70 | Temp 97.9°F | Resp 14 | Ht 66.0 in | Wt 197.0 lb

## 2016-06-04 DIAGNOSIS — R0981 Nasal congestion: Secondary | ICD-10-CM | POA: Diagnosis not present

## 2016-06-04 DIAGNOSIS — G44219 Episodic tension-type headache, not intractable: Secondary | ICD-10-CM

## 2016-06-04 MED ORDER — OXYMETAZOLINE HCL 0.05 % NA SOLN: 1.0000 | Freq: Two times a day (BID) | NASAL | 0 refills | Status: DC

## 2016-06-04 MED ORDER — CYCLOBENZAPRINE HCL 5 MG PO TABS: 5.0000 mg | ORAL_TABLET | Freq: Every day | ORAL | 0 refills | Status: DC

## 2016-06-04 MED ORDER — BUTALBITAL-APAP-CAFFEINE 50-325-40 MG PO TABS
1.0000 | ORAL_TABLET | Freq: Four times a day (QID) | ORAL | 0 refills | Status: DC | PRN
Start: 2016-06-04 — End: 2016-08-20

## 2016-06-04 NOTE — Patient Instructions (Addendum)
URI Instructions: Flonase and Afrin use: apply 1spray of afrin in each nare, wait 47mins, then apply 2sprays of flonase in each nare. Use both nasal spray consecutively x 3days, then flonase only for at least 14days.  Encourage adequate oral hydration.  Call office for oral abx prescription if no improvement in 3days.   Migraine Headache A migraine headache is a very strong throbbing pain on one side or both sides of your head. Migraines can also cause other symptoms. Talk with your doctor about what things may bring on (trigger) your migraine headaches. Follow these instructions at home: Medicines  Take over-the-counter and prescription medicines only as told by your doctor.  Do not drive or use heavy machinery while taking prescription pain medicine.  To prevent or treat constipation while you are taking prescription pain medicine, your doctor may recommend that you:  Drink enough fluid to keep your pee (urine) clear or pale yellow.  Take over-the-counter or prescription medicines.  Eat foods that are high in fiber. These include fresh fruits and vegetables, whole grains, and beans.  Limit foods that are high in fat and processed sugars. These include fried and sweet foods. Lifestyle  Avoid alcohol.  Do not use any products that contain nicotine or tobacco, such as cigarettes and e-cigarettes. If you need help quitting, ask your doctor.  Get at least 8 hours of sleep every night.  Limit your stress. General instructions  Keep a journal to find out what may bring on your migraines. For example, write down:  What you eat and drink.  How much sleep you get.  Any change in what you eat or drink.  Any change in your medicines.  If you have a migraine:  Avoid things that make your symptoms worse, such as bright lights.  It may help to lie down in a dark, quiet room.  Do not drive or use heavy machinery.  Ask your doctor what activities are safe for you.  Keep all  follow-up visits as told by your doctor. This is important. Contact a doctor if:  You get a migraine that is different or worse than your usual migraines. Get help right away if:  Your migraine gets very bad.  You have a fever.  You have a stiff neck.  You have trouble seeing.  Your muscles feel weak or like you cannot control them.  You start to lose your balance a lot.  You start to have trouble walking.  You pass out (faint). This information is not intended to replace advice given to you by your health care provider. Make sure you discuss any questions you have with your health care provider. Document Released: 02/04/2008 Document Revised: 11/15/2015 Document Reviewed: 10/14/2015 Elsevier Interactive Patient Education  2017 [employer].

## 2016-06-04 NOTE — Telephone Encounter (Signed)
Echocardiogram was entered during her last office visit. Please ask Cecille Rubin or Stanton Kidney about status of referral. Thank you

## 2016-06-04 NOTE — Telephone Encounter (Signed)
Please advise 

## 2016-06-04 NOTE — Telephone Encounter (Signed)
Before pt left she stated that there was supposed to be a referral put in for her to get an echocardiogram. I do not see a referral put in for this. Please advise

## 2016-06-04 NOTE — Progress Notes (Signed)
Subjective:  Patient ID: Abigail [name], female    DOB: [birth year]  Age: 46 y.o. MRN: [id number]  CC: Headache (has been having headaches since sunday states the worse they were a 6, constant )   Headache   This is a recurrent problem. The current episode started in the past 7 days. The problem occurs constantly. The problem has been gradually worsening. The pain is located in the frontal, occipital, retro-orbital and temporal region. The pain does not radiate. The pain quality is similar to prior headaches. The quality of the pain is described as aching, dull and throbbing. The pain is moderate. Associated symptoms include sinus pressure. Pertinent negatives include no abdominal pain, abnormal behavior, anorexia, back pain, blurred vision, coughing, dizziness, drainage, ear pain, eye pain, eye watering, facial sweating, fever, loss of balance, muscle aches, phonophobia, photophobia, rhinorrhea, scalp tenderness, seizures, sore throat, swollen glands, tingling, tinnitus, visual change, vomiting, weakness or weight loss. The symptoms are aggravated by unknown. She has tried acetaminophen and NSAIDs (allegra) for the symptoms. The treatment provided mild relief. Her past medical history is significant for hypertension, migraine headaches, obesity and sinus disease. There is no history of cancer, cluster headaches, immunosuppression, migraines in the family, pseudotumor cerebri, recent head traumas or TMJ.    Outpatient Medications Prior to Visit  Medication Sig Dispense Refill  . fexofenadine (ALLEGRA) 180 MG tablet Take 1 tablet (180 mg total) by mouth daily. 90 tablet 1  . fluticasone (FLONASE) 50 MCG/ACT nasal spray Place 1 spray into both nostrils daily. 16 g 2  . naproxen (NAPROSYN) 500 MG tablet Take 1 tablet (500 mg total) by mouth 2 (two) times daily. 30 tablet 0  . omeprazole (PRILOSEC OTC) 20 MG tablet Take 1 tablet (20 mg total) by mouth daily as needed. For heartburn 30 tablet 3  .  sertraline (ZOLOFT) 25 MG tablet Take 2 tablets (50 mg total) by mouth daily. 60 tablet 3  . triamterene-hydrochlorothiazide (MAXZIDE-25) 37.5-25 MG tablet Take 1 tablet by mouth daily. 30 tablet 3   No facility-administered medications prior to visit.     ROS See HPI  Objective:  BP 122/84 (BP Location: Left Arm, Patient Position: Sitting, Cuff Size: Large)   Pulse 70   Temp 97.9 F (36.6 C) (Oral)   Resp 14   Ht 5\' 6"  (1.676 m)   Wt 197 lb (89.4 kg)   LMP 01/18/2012   SpO2 98%   BMI 31.80 kg/m   BP Readings from Last 3 Encounters:  06/04/16 122/84  05/22/16 126/64  04/07/16 132/84    Wt Readings from Last 3 Encounters:  06/04/16 197 lb (89.4 kg)  05/22/16 198 lb (89.8 kg)  04/07/16 196 lb (88.9 kg)    Physical Exam  Constitutional: She is oriented to person, place, and time.  HENT:  Right Ear: Tympanic membrane, external ear and ear canal normal.  Left Ear: Tympanic membrane, external ear and ear canal normal.  Nose: Mucosal edema and rhinorrhea present. Right sinus exhibits frontal sinus tenderness. Right sinus exhibits no maxillary sinus tenderness. Left sinus exhibits frontal sinus tenderness. Left sinus exhibits no maxillary sinus tenderness.  Mouth/Throat: Uvula is midline. No trismus in the jaw. Posterior oropharyngeal erythema present. No oropharyngeal exudate.  Eyes: Conjunctivae and EOM are normal. Pupils are equal, round, and reactive to light. No scleral icterus.  Neck: Normal range of motion. Neck supple.  Cardiovascular: Normal rate and normal heart sounds.   Pulmonary/Chest: Effort normal and breath sounds normal.  Musculoskeletal: She  exhibits no edema.  Lymphadenopathy:    She has no cervical adenopathy.  Neurological: She is alert and oriented to person, place, and time.  Skin: Skin is warm and dry.  Vitals reviewed.   Lab Results  Component Value Date   WBC 6.9 01/24/2016   HGB 14.1 01/24/2016   HCT 42.6 01/24/2016   PLT 294 01/24/2016    GLUCOSE 99 01/24/2016   CHOL 198 06/17/2012   TRIG 156.0 (H) 06/17/2012   HDL 43.20 06/17/2012   LDLCALC 124 (H) 06/17/2012   ALT 21 04/07/2016   AST 34 04/07/2016   NA 137 01/24/2016   K 3.6 01/24/2016   CL 102 01/24/2016   CREATININE 0.85 01/24/2016   BUN 10 01/24/2016   CO2 27 01/24/2016   TSH 0.35 (L) 05/22/2016   HGBA1C 6.1 04/07/2016    Dg Chest 2 View  Result Date: 01/24/2016 CLINICAL DATA:  Left-sided chest pain, pleuritic.  Duration 2 days EXAM: CHEST  2 VIEW COMPARISON:  None. FINDINGS: The lungs are clear. The pulmonary vasculature is normal. Heart size is normal. Hilar and mediastinal contours are unremarkable. There is no pleural effusion. IMPRESSION: No active cardiopulmonary disease. Electronically Signed   By: Andreas Newport M.D.   On: 01/24/2016 05:32    Assessment & Plan:   Alna was seen today for headache.  Diagnoses and all orders for this visit:  Episodic tension-type headache, not intractable -     butalbital-acetaminophen-caffeine (FIORICET, ESGIC) 50-325-40 MG tablet; Take 1 tablet by mouth every 6 (six) hours as needed for headache. -     cyclobenzaprine (FLEXERIL) 5 MG tablet; Take 1 tablet (5 mg total) by mouth at bedtime.  Nasal sinus congestion -     oxymetazoline (AFRIN NASAL SPRAY) 0.05 % nasal spray; Place 1 spray into both nostrils 2 (two) times daily. Use only for 3days, then stop   I am having Ms. Oatis start on oxymetazoline, butalbital-acetaminophen-caffeine, and cyclobenzaprine. I am also having her maintain her fexofenadine, naproxen, sertraline, triamterene-hydrochlorothiazide, omeprazole, and fluticasone.  Meds ordered this encounter  Medications  . oxymetazoline (AFRIN NASAL SPRAY) 0.05 % nasal spray    Sig: Place 1 spray into both nostrils 2 (two) times daily. Use only for 3days, then stop    Dispense:  30 mL    Refill:  0    Order Specific Question:   Supervising Provider    Answer:   Cassandria Anger [[id number]]  .  butalbital-acetaminophen-caffeine (FIORICET, ESGIC) 50-325-40 MG tablet    Sig: Take 1 tablet by mouth every 6 (six) hours as needed for headache.    Dispense:  20 tablet    Refill:  0    Order Specific Question:   Supervising Provider    Answer:   Cassandria Anger [[id number]]  . cyclobenzaprine (FLEXERIL) 5 MG tablet    Sig: Take 1 tablet (5 mg total) by mouth at bedtime.    Dispense:  10 tablet    Refill:  0    Order Specific Question:   Supervising Provider    Answer:   Cassandria Anger [[id number]]    Follow-up: Return if symptoms worsen or fail to improve.  Wilfred Lacy, NP

## 2016-06-10 ENCOUNTER — Ambulatory Visit (HOSPITAL_BASED_OUTPATIENT_CLINIC_OR_DEPARTMENT_OTHER)
Admission: RE | Admit: 2016-06-10 | Discharge: 2016-06-10 | Disposition: A | Discharge status: Home / Self Care | Payer: BLUE CROSS/BLUE SHIELD | Source: Ambulatory Visit | Attending: Nurse Practitioner | Admitting: Nurse Practitioner

## 2016-06-10 ENCOUNTER — Ambulatory Visit (HOSPITAL_COMMUNITY)
Admission: RE | Admit: 2016-06-10 | Discharge: 2016-06-10 | Disposition: A | Discharge status: Home / Self Care | Payer: BLUE CROSS/BLUE SHIELD | Source: Ambulatory Visit | Attending: Nurse Practitioner | Admitting: Nurse Practitioner

## 2016-06-10 DIAGNOSIS — I10 Essential (primary) hypertension: Secondary | ICD-10-CM | POA: Diagnosis not present

## 2016-06-10 DIAGNOSIS — R002 Palpitations: Secondary | ICD-10-CM

## 2016-06-10 DIAGNOSIS — I071 Rheumatic tricuspid insufficiency: Secondary | ICD-10-CM | POA: Insufficient documentation

## 2016-06-10 DIAGNOSIS — R55 Syncope and collapse: Secondary | ICD-10-CM

## 2016-06-10 DIAGNOSIS — G43909 Migraine, unspecified, not intractable, without status migrainosus: Secondary | ICD-10-CM | POA: Insufficient documentation

## 2016-06-10 LAB — VAS US CAROTID
LCCADDIAS: -34 cm/s
LCCAPSYS: 116 cm/s
LEFT ECA DIAS: -15 cm/s
LEFT VERTEBRAL DIAS: -25 cm/s
LICADSYS: -98 cm/s
Left CCA dist sys: -92 cm/s
Left CCA prox dias: 32 cm/s
Left ICA dist dias: -50 cm/s
Left ICA prox dias: -33 cm/s
Left ICA prox sys: -74 cm/s
RCCADSYS: -94 cm/s
RIGHT ECA DIAS: -17 cm/s
RIGHT VERTEBRAL DIAS: -23 cm/s
Right CCA prox dias: -29 cm/s
Right CCA prox sys: -92 cm/s

## 2016-06-10 NOTE — Progress Notes (Signed)
**  Preliminary report by tech**  Carotid artery duplex complete. Findings are consistent with a 1-39 percent stenosis involving the right internal carotid artery and the left internal carotid artery. The vertebral arteries demonstrate antegrade flow.  06/10/16 10:16 AM Abigail [name] RVT

## 2016-06-10 NOTE — Progress Notes (Signed)
  Echocardiogram 2D Echocardiogram has been performed.  Abigail [name] M 06/10/2016, 9:31 AM

## 2016-06-12 ENCOUNTER — Telehealth: Payer: Self-pay | Admitting: Nurse Practitioner

## 2016-06-12 NOTE — Telephone Encounter (Signed)
Patient called in requesting recent lab results.  Gave patient [name] response on labs.

## 2016-07-10 ENCOUNTER — Ambulatory Visit (INDEPENDENT_AMBULATORY_CARE_PROVIDER_SITE_OTHER): Payer: BLUE CROSS/BLUE SHIELD | Admitting: Allergy

## 2016-07-10 ENCOUNTER — Encounter: Payer: Self-pay | Admitting: Allergy

## 2016-07-10 VITALS — BP 130/80 | HR 87 | Temp 98.7°F | Resp 16 | Ht 65.0 in | Wt 199.8 lb

## 2016-07-10 DIAGNOSIS — T781XXA Other adverse food reactions, not elsewhere classified, initial encounter: Secondary | ICD-10-CM

## 2016-07-10 DIAGNOSIS — R21 Rash and other nonspecific skin eruption: Secondary | ICD-10-CM | POA: Diagnosis not present

## 2016-07-10 DIAGNOSIS — T7840XA Allergy, unspecified, initial encounter: Secondary | ICD-10-CM

## 2016-07-10 MED ORDER — EPINEPHRINE 0.3 MG/0.3ML IJ SOAJ: INTRAMUSCULAR | 1 refills | Status: DC

## 2016-07-10 NOTE — Progress Notes (Signed)
New Patient Note  RE: Abigail [name] MRN: [id number] DOB: Jan 05, 1971 Date of Office Visit: 07/10/2016  Referring provider: Flossie Buffy, NP Primary care provider: Wilfred Lacy, NP  Chief Complaint: Allergic reaction  History of present illness: Abigail [name] is a 46 y.o. female presenting today for consultation for allergic reactions.  She has been having allergic reactions over the past 1 year.  She reports the first time she had a reaction she was eating a spoonful of collard greens from a restaurant about 1 year ago.  30 mintues later she felt itchy and thought she was bitten by a bug.  She reports she was itchy all over and her eyes were very itchy. She also felt like her throat was closing.  She went to ED and was given benadryl per pt report.  She does not recall any other food items eaten with the collard greens.  The past summer she ate crawfish with pasta and sausage at a food fest.  30 minutes later her eyes started to itch then she started feeling itchy all over and had a red face, hoarse voice and a "funny feeling" tongue. She does not recall if she took any treatment for this reaction.  Her 3rd reaction was in October 2017 after she ate Japanese shrimp dish where she reports her throat felt funny and reports difficulty swallowing, lip swelling, overall itchiness .  She was at work as a Psychologist, sport and exercise at the time an she received a benadryl injection.  She was also given an epipen sample from her job.  She reports she has not eaten any shrimp since this last episode.  She reports she would eat shrimp before this last episode without issue.    She has also been getting an itchy rash for past year that comes and goes randomly.  No swelling associate with this rash.  She reports the rash last in total about a week at a time.  She has used triamcinolone cream but did not feel it was helpful.    She does reports seasonal allergies of nasal congestion, sinus headaches and  reports symptoms can be year-round.  She has used flonase and allegra to help with these symptoms.  She uses these medications as needed.   No history of asthma or eczema.     Review of systems: Review of Systems  Constitutional: Negative for chills, fever and malaise/fatigue.  HENT: Negative for congestion, ear pain, nosebleeds, sinus pain, sore throat and tinnitus.   Eyes: Negative for discharge and redness.  Respiratory: Negative for cough, shortness of breath and wheezing.   Cardiovascular: Negative for chest pain.  Gastrointestinal: Positive for heartburn. Negative for abdominal pain, diarrhea, nausea and vomiting.  Musculoskeletal: Negative for joint pain and myalgias.  Skin: Positive for itching and rash.  Neurological: Negative for headaches.    All other systems negative unless noted above in HPI  Past medical history: Past Medical History:  Diagnosis Date  . Anemia    iron supp  . Anxiety   . Depression   . Fibroids   . GERD (gastroesophageal reflux disease)    omeprazole  . Headache(784.0)   . Hypertension 2012   Maxide  . S/P LEEP 1992  . Urticaria     Past surgical history: Past Surgical History:  Procedure Laterality Date  . LEEP  1998   Abnormal pap  . SUPRACERVICAL ABDOMINAL HYSTERECTOMY  01/18/2012   Procedure: HYSTERECTOMY SUPRACERVICAL ABDOMINAL with LSO;  Surgeon: Osborne Oman, MD;  Location: [facility];  Service: Gynecology;  Laterality: N/A;  . TUBAL LIGATION  1997  . WISDOM TOOTH EXTRACTION      Family history:  Family History  Problem Relation Age of Onset  . Hypertension Father   . Depression Father   . Diabetes Father   . Alcohol abuse Father   . Diabetes Sister   . Diabetes Brother   . Hypertension Mother   . Arthritis Mother   . Hypertension Sister   . Cancer Paternal Grandmother     breast  . Allergic rhinitis Neg Hx   . Angioedema Neg Hx   . Asthma Neg Hx   . Eczema Neg Hx   . Immunodeficiency Neg Hx   . Urticaria Neg Hx       Social history: She lives in a home with carpeting with electric heating and central cooling. There are no pets in the home. There is no concern for water damage, mild or roaches in the home. She works as a Psychologist, sport and exercise. She has no tobacco use or exposure.   Medication List: Allergies as of 07/10/2016      Reactions   Tylox [oxycodone-acetaminophen] Itching      Medication List       Accurate as of 07/10/16  4:44 PM. Always use your most recent med list.          butalbital-acetaminophen-caffeine 50-325-40 MG tablet Commonly known as:  FIORICET, ESGIC Take 1 tablet by mouth every 6 (six) hours as needed for headache.   fexofenadine 180 MG tablet Commonly known as:  ALLEGRA Take 1 tablet (180 mg total) by mouth daily.   fluticasone 50 MCG/ACT nasal spray Commonly known as:  FLONASE Place 1 spray into both nostrils daily.   naproxen 500 MG tablet Commonly known as:  NAPROSYN Take 1 tablet (500 mg total) by mouth 2 (two) times daily.   omeprazole 20 MG tablet Commonly known as:  PRILOSEC OTC Take 1 tablet (20 mg total) by mouth daily as needed. For heartburn   oxymetazoline 0.05 % nasal spray Commonly known as:  AFRIN NASAL SPRAY Place 1 spray into both nostrils 2 (two) times daily. Use only for 3days, then stop   sertraline 25 MG tablet Commonly known as:  ZOLOFT Take 2 tablets (50 mg total) by mouth daily.   triamterene-hydrochlorothiazide 37.5-25 MG tablet Commonly known as:  MAXZIDE-25 Take 1 tablet by mouth daily.       Known medication allergies: Allergies  Allergen Reactions  . Tylox [Oxycodone-Acetaminophen] Itching     Physical examination: Blood pressure 130/80, pulse 87, temperature 98.7 F (37.1 C), temperature source Oral, resp. rate 16, height '5\' 5"'  (1.651 m), weight 199 lb 12.8 oz (90.6 kg), last menstrual period 01/18/2012, SpO2 96 %.  General: Alert, interactive, in no acute distress. HEENT: TMs pearly gray, turbinates minimally  edematous without discharge, post-pharynx non erythematous. Neck: Supple without lymphadenopathy. Lungs: Clear to auscultation without wheezing, rhonchi or rales. {no increased work of breathing. CV: Normal S1, S2 without murmurs. Abdomen: Nondistended, nontender. Skin: Scattered erythematous urticarial type lesions primarily located Over her upper chest , nonvesicular. Extremities:  No clubbing, cyanosis or edema. Neuro:   Grossly intact.  Diagnositics/Labs: Physical urticaria testing done with use of tongue depressor on the forearm with immediate erythema and mild wheal.  Allergy testing: Deferred due to testing as above and current rash   Assessment and plan:   Allergic reactions/adverse food reaction    - Reactions appear to be related to  food ingestion with 2 of the occasions having eaten shellfish.  However she also reports having a hive-like rash that is intermittent which may mean that she could be having idiopathic type reactions.    -  At this time continue avoidance of shrimp/shellfish until labs return    - will obtain serum IgE levels to shellfish panel and alpha-gal panel (given pork ingestion and unknown if the collard green reaction had any meets cooked in).  Also obtain tryptase level, ESR, CRP, hive panel and environmental allergen panel    - have access to your Epipen at all time.  Follow emergency action plan in case of allergic reaction, provided today     - start Allegra 170m and Zantac 1551mdaily.   May increase to twice a day dosing if you continue to have itchy rash and/or reactions on daily dosing.     You may take Zantac and Omeprazole together if you need it for control of your reflux.     Follow-up 4-6 months   I appreciate the opportunity to take part in Abigail [name]'s care. Please do not hesitate to contact me with questions.  Sincerely,   ShPrudy FeelerMD Allergy/[facility]ology [facility]

## 2016-07-10 NOTE — Patient Instructions (Signed)
Allergic reactions    -  At this time continue avoidance of shrimp/shellfish until labs return    - will obtain serum IgE levels to shellfish panel and alpha-gal panel.   Also obtain tryptase level, ESR, CRP, hive panel and environmental allergen panel    - have access to your Epipen at all time.  Follow emergency action plan in case of allergic reaction    - start Allegra 159m and Zantac 157mdaily.   May increase to twice a day dosing if you continue to have itchy rash and/or reactions on daily dosing.     You may take Zantac and Omeprazole together if you need it for control of your reflux.     Follow-up 4-6 months

## 2016-07-11 LAB — SEDIMENTATION RATE: Sed Rate: 1 mm/hr (ref 0–20)

## 2016-07-13 LAB — CP584 ZONE 3
Allergen, A. alternata, m6: <0.1 kU/L
Allergen, Black Locust, Acacia9: <0.1 kU/L
Allergen, Cedar tree, t12: 0.24 kU/L — ABNORMAL HIGH
Allergen, Comm Silver Birch, t9: <0.1 kU/L
Allergen, D pternoyssinus,d7: 4.92 kU/L — ABNORMAL HIGH
Allergen, Mucor Racemosus, M4: <0.1 kU/L
Allergen, P. notatum, m1: <0.1 kU/L
Box Elder IgE: <0.1 kU/L
Cat Dander: <0.1 kU/L
Cockroach: 5.14 kU/L — ABNORMAL HIGH
Common Ragweed: <0.1 kU/L
D. farinae: 7.48 kU/L — ABNORMAL HIGH
Elm IgE: <0.1 kU/L
Nettle: 0.24 kU/L — ABNORMAL HIGH
Plantain: <0.1 kU/L

## 2016-07-13 LAB — C-REACTIVE PROTEIN: CRP: 3 mg/L (ref <8.0)

## 2016-07-13 LAB — ALLERGY-SHELLFISH PANEL
CLAMS: 2.38 kU/L — AB
Crab: 12.7 kU/L — ABNORMAL HIGH
LOBSTER: 11.5 kU/L — AB
Shrimp IgE: 10.3 kU/L — ABNORMAL HIGH

## 2016-07-14 LAB — TRYPTASE: TRYPTASE: 8.3 ug/L (ref <11)

## 2016-07-15 LAB — ALPHA-GAL PANEL
BEEF IGE: 0.17 kU/L (ref <0.35)
CLASS: 0
Class: 0
Lamb/Mutton IgE: <0.1 kU/L (ref <0.35)
Pork IgE: <0.1 kU/L (ref <0.35)

## 2016-07-22 ENCOUNTER — Encounter: Payer: Self-pay | Admitting: Allergy

## 2016-07-22 LAB — CP CHRONIC URTICARIA INDEX PANEL
Histamine Release: 25 % — ABNORMAL HIGH (ref <16)
TSH: 0.62 m[IU]/L
Thyroglobulin Ab: <1 IU/mL (ref <2)
Thyroperoxidase Ab SerPl-aCnc: 2 IU/mL (ref <9)

## 2016-07-23 ENCOUNTER — Encounter: Payer: Self-pay | Admitting: Nurse Practitioner

## 2016-07-27 ENCOUNTER — Encounter: Payer: Self-pay | Admitting: Nurse Practitioner

## 2016-07-27 NOTE — Progress Notes (Signed)
Patient ID: Abigail [name], female   DOB: 11/28/1970, 46 y.o.   MRN: [id number]  46 y.o. [id number] Married  African American Fe here for [place] annual exam. Previous pt not seen since 2015 as she had moved to [place]. Still having spotting X 2 days monthly brown to reddish brown.  She has always had spotting post surgery monthly.  Some hot flashes most recently they are worse in the day.  Moved back to [place] from [place] and works as CMA at [employer]. She is now separated from her husband and desires STD's.  They are trying to work things out = he was unfaithful.  She is having some vaginal discharge.  Patient's last menstrual period was 01/18/2012 (exact date).          Sexually active: Yes.    The current method of family planning is status post hysterectomy supracervical   Exercising: No.  The patient does not participate in regular exercise at present. Smoker:  no  Health Maintenance: Pap: 09/25/11, Negative with neg HR HPV MMG: 12/262017 normal seen via portal on phone. TDaP: 04/10/14 HIV: 1997 with pregnancy Labs: Fasting labs today   reports that she has never smoked. She has never used smokeless tobacco. She reports that she drinks about 0.5 oz of alcohol per week . She reports that she does not use drugs.  Past Medical History:  Diagnosis Date  . Anemia    iron supp  . Anxiety   . Depression   . Fibroids   . GERD (gastroesophageal reflux disease)    omeprazole  . Headache(784.0)   . Hypertension 2012   Maxide  . S/P LEEP 1992  . Urticaria     Past Surgical History:  Procedure Laterality Date  . LEEP  1998   Abnormal pap  . SUPRACERVICAL ABDOMINAL HYSTERECTOMY  01/18/2012   Procedure: HYSTERECTOMY SUPRACERVICAL ABDOMINAL with LSO;  Surgeon: Osborne Oman, MD;  Location: [facility];  Service: Gynecology;  Laterality: N/A;  . TUBAL LIGATION  1997  . WISDOM TOOTH EXTRACTION      Current Outpatient Prescriptions  Medication Sig Dispense Refill  . cetirizine  (ZYRTEC) 10 MG tablet Take 10 mg by mouth daily.    Marland Kitchen EPINEPHrine 0.3 mg/0.3 mL IJ SOAJ injection Use as directed for severe allergic reaction 2 Device 1  . fexofenadine (ALLEGRA) 180 MG tablet Take 1 tablet (180 mg total) by mouth daily. 90 tablet 1  . fluticasone (FLONASE) 50 MCG/ACT nasal spray Place 1 spray into both nostrils daily. 16 g 2  . omeprazole (PRILOSEC OTC) 20 MG tablet Take 1 tablet (20 mg total) by mouth daily as needed. For heartburn 30 tablet 3  . oxymetazoline (AFRIN NASAL SPRAY) 0.05 % nasal spray Place 1 spray into both nostrils 2 (two) times daily. Use only for 3days, then stop 30 mL 0  . saccharomyces boulardii (FLORASTOR) 250 MG capsule Take 250 mg by mouth 2 (two) times daily.    . sertraline (ZOLOFT) 25 MG tablet Take 2 tablets (50 mg total) by mouth daily. 60 tablet 3  . triamterene-hydrochlorothiazide (MAXZIDE-25) 37.5-25 MG tablet Take 1 tablet by mouth daily. 30 tablet 3  . butalbital-acetaminophen-caffeine (FIORICET, ESGIC) 50-325-40 MG tablet Take 1 tablet by mouth every 6 (six) hours as needed for headache. (Patient not taking: Reported on 07/28/2016) 20 tablet 0  . naproxen (NAPROSYN) 500 MG tablet Take 1 tablet (500 mg total) by mouth 2 (two) times daily. (Patient not taking: Reported on 07/28/2016) 30 tablet 0  No current facility-administered medications for this visit.     Family History  Problem Relation Age of Onset  . Hypertension Father   . Depression Father   . Diabetes Father   . Alcohol abuse Father   . Colonic polyp Father   . Diabetes Sister   . Diabetes Brother   . Hypertension Mother   . Arthritis Mother   . Pulmonary fibrosis Mother   . Hypertension Sister   . Cancer Paternal Grandmother     breast  . Heart attack Paternal Grandmother   . Allergic rhinitis Neg Hx   . Angioedema Neg Hx   . Asthma Neg Hx   . Eczema Neg Hx   . Immunodeficiency Neg Hx   . Urticaria Neg Hx     ROS:  Pertinent items are noted in HPI.  Otherwise, a  comprehensive ROS was negative.  Exam:   BP 136/90 (BP Location: Right Arm, Patient Position: Sitting, Cuff Size: Large)   Pulse 68   Ht 5' 5.25" (1.657 m)   Wt 200 lb (90.7 kg)   LMP 01/18/2012 (Exact Date)   BMI 33.03 kg/m  Height: 5' 5.25" (165.7 cm) Ht Readings from Last 3 Encounters:  07/28/16 5' 5.25" (1.657 m)  07/10/16 5\' 5"  (1.651 m)  06/04/16 5\' 6"  (1.676 m)    General appearance: alert, cooperative and appears stated age Head: Normocephalic, without obvious abnormality, atraumatic Neck: no adenopathy, supple, symmetrical, trachea midline and thyroid normal to inspection and palpation Lungs: clear to auscultation bilaterally Breasts: normal appearance, no masses or tenderness Heart: regular rate and rhythm Abdomen: soft, non-tender; no masses,  no organomegaly Extremities: extremities normal, atraumatic, no cyanosis or edema Skin: Skin color, texture, turgor normal. No rashes or lesions Lymph nodes: Cervical, supraclavicular, and axillary nodes normal. No abnormal inguinal nodes palpated Neurologic: Grossly normal   Pelvic: External genitalia:  no lesions              Urethra:  normal appearing urethra with no masses, tenderness or lesions              Bartholin's and Skene's: normal                 Vagina: normal appearing vagina with normal color and discharge, no lesions              Cervix: anteverted              Pap taken: Yes.   Bimanual Exam:  Uterus:  uterus absent              Adnexa: no mass, fullness, tenderness               Rectovaginal: Confirms               Anus:  normal sphincter tone, no lesions  Chaperone present: yes  A:  Well Woman with normal exam   S/P Supracervical Hysterectomy and LSO secondary to UTE fibroids, menorrhagia 9/13             S/P Leep 1992 with normal pap since             History of HTN             R/O STD's  Marital discord  R/O vaginitis   P:   Reviewed health and wellness pertinent to exam  Pap smear as  above  Mammogram is due now and will schedule  Follow with labs and pap (if BV pt  prefers Metrogel)  Counseled on breast self exam, mammography screening, STD prevention, HIV risk factors and prevention, adequate intake of calcium and vitamin D, diet and exercise, Kegel's exercises return annually or prn  An After Visit Summary was printed and given to the patient.

## 2016-07-28 ENCOUNTER — Encounter: Payer: Self-pay | Admitting: Nurse Practitioner

## 2016-07-28 ENCOUNTER — Ambulatory Visit (INDEPENDENT_AMBULATORY_CARE_PROVIDER_SITE_OTHER): Payer: BLUE CROSS/BLUE SHIELD | Admitting: Nurse Practitioner

## 2016-07-28 VITALS — BP 136/90 | HR 68 | Ht 65.25 in | Wt 200.0 lb

## 2016-07-28 DIAGNOSIS — R87619 Unspecified abnormal cytological findings in specimens from cervix uteri: Secondary | ICD-10-CM | POA: Diagnosis not present

## 2016-07-28 DIAGNOSIS — R945 Abnormal results of liver function studies: Secondary | ICD-10-CM

## 2016-07-28 DIAGNOSIS — E559 Vitamin D deficiency, unspecified: Secondary | ICD-10-CM

## 2016-07-28 DIAGNOSIS — R7989 Other specified abnormal findings of blood chemistry: Secondary | ICD-10-CM | POA: Diagnosis not present

## 2016-07-28 DIAGNOSIS — Z0000 Encounter for general adult medical examination without abnormal findings: Secondary | ICD-10-CM | POA: Diagnosis not present

## 2016-07-28 DIAGNOSIS — Z90711 Acquired absence of uterus with remaining cervical stump: Secondary | ICD-10-CM | POA: Diagnosis not present

## 2016-07-28 DIAGNOSIS — N760 Acute vaginitis: Secondary | ICD-10-CM | POA: Diagnosis not present

## 2016-07-28 DIAGNOSIS — Z01419 Encounter for gynecological examination (general) (routine) without abnormal findings: Secondary | ICD-10-CM

## 2016-07-28 DIAGNOSIS — Z113 Encounter for screening for infections with a predominantly sexual mode of transmission: Secondary | ICD-10-CM | POA: Diagnosis not present

## 2016-07-28 LAB — COMPREHENSIVE METABOLIC PANEL
ALT: 38 U/L — ABNORMAL HIGH (ref 6–29)
AST: 43 U/L — ABNORMAL HIGH (ref 10–35)
Albumin: 3.8 g/dL (ref 3.6–5.1)
Alkaline Phosphatase: 41 U/L (ref 33–115)
BILIRUBIN TOTAL: 0.4 mg/dL (ref 0.2–1.2)
BUN: 10 mg/dL (ref 7–25)
CALCIUM: 9.1 mg/dL (ref 8.6–10.2)
CO2: 31 mmol/L (ref 20–31)
Chloride: 101 mmol/L (ref 98–110)
Creat: 0.86 mg/dL (ref 0.50–1.10)
GLUCOSE: 80 mg/dL (ref 65–99)
POTASSIUM: 4.3 mmol/L (ref 3.5–5.3)
Sodium: 138 mmol/L (ref 135–146)
Total Protein: 6.4 g/dL (ref 6.1–8.1)

## 2016-07-28 LAB — LIPID PANEL
CHOL/HDL RATIO: 4.1 ratio (ref <5.0)
Cholesterol: 225 mg/dL — ABNORMAL HIGH (ref <200)
HDL: 55 mg/dL (ref >50)
LDL CALC: 141 mg/dL — AB (ref <100)
Triglycerides: 146 mg/dL (ref <150)
VLDL: 29 mg/dL (ref <30)

## 2016-07-28 LAB — CBC
HCT: 43.9 % (ref 35.0–45.0)
HEMOGLOBIN: 14.4 g/dL (ref 11.7–15.5)
MCH: 30.3 pg (ref 27.0–33.0)
MCHC: 32.8 g/dL (ref 32.0–36.0)
MCV: 92.4 fL (ref 80.0–100.0)
MPV: 9 fL (ref 7.5–12.5)
Platelets: 305 10*3/uL (ref 140–400)
RBC: 4.75 MIL/uL (ref 3.80–5.10)
RDW: 13.7 % (ref 11.0–15.0)
WBC: 6.4 10*3/uL (ref 3.8–10.8)

## 2016-07-28 NOTE — Patient Instructions (Signed)

## 2016-07-29 LAB — HEMOGLOBIN A1C
Hgb A1c MFr Bld: 5.6 % (ref <5.7)
MEAN PLASMA GLUCOSE: 114 mg/dL

## 2016-07-29 LAB — WET PREP BY MOLECULAR PROBE
CANDIDA SPECIES: NOT DETECTED
Gardnerella vaginalis: DETECTED — AB
TRICHOMONAS VAG: NOT DETECTED

## 2016-07-29 LAB — GC/CHLAMYDIA PROBE AMP
CT Probe RNA: NOT DETECTED
GC Probe RNA: NOT DETECTED

## 2016-07-29 LAB — VITAMIN D 25 HYDROXY (VIT D DEFICIENCY, FRACTURES): VIT D 25 HYDROXY: 21 ng/mL — AB (ref 30–100)

## 2016-07-29 MED ORDER — VITAMIN D (ERGOCALCIFEROL) 1.25 MG (50000 UNIT) PO CAPS: 50000.0000 [IU] | ORAL_CAPSULE | ORAL | 0 refills | Status: DC

## 2016-07-29 MED ORDER — METRONIDAZOLE 0.75 % VA GEL: 1.0000 | Freq: Every day | VAGINAL | 0 refills | Status: AC

## 2016-07-30 LAB — STD PANEL
HEP B S AG: NEGATIVE
HIV 1&2 Ab, 4th Generation: NONREACTIVE

## 2016-07-30 LAB — IPS PAP TEST WITH HPV

## 2016-07-30 NOTE — Progress Notes (Signed)
Encounter reviewed by Dr. Brook Amundson C. Silva.  

## 2016-08-12 ENCOUNTER — Other Ambulatory Visit (INDEPENDENT_AMBULATORY_CARE_PROVIDER_SITE_OTHER): Payer: BLUE CROSS/BLUE SHIELD

## 2016-08-12 DIAGNOSIS — R945 Abnormal results of liver function studies: Principal | ICD-10-CM

## 2016-08-12 DIAGNOSIS — R7989 Other specified abnormal findings of blood chemistry: Secondary | ICD-10-CM

## 2016-08-12 LAB — HEPATIC FUNCTION PANEL
ALK PHOS: 33 U/L (ref 33–115)
ALT: 14 U/L (ref 6–29)
AST: 19 U/L (ref 10–35)
Albumin: 3.3 g/dL — ABNORMAL LOW (ref 3.6–5.1)
BILIRUBIN DIRECT: 0.1 mg/dL (ref <=0.2)
BILIRUBIN TOTAL: 0.2 mg/dL (ref 0.2–1.2)
Indirect Bilirubin: 0.1 mg/dL — ABNORMAL LOW (ref 0.2–1.2)
Total Protein: 5.6 g/dL — ABNORMAL LOW (ref 6.1–8.1)

## 2016-08-20 ENCOUNTER — Encounter: Payer: Self-pay | Admitting: Nurse Practitioner

## 2016-08-20 ENCOUNTER — Encounter: Payer: BLUE CROSS/BLUE SHIELD | Admitting: Nurse Practitioner

## 2016-08-20 ENCOUNTER — Ambulatory Visit (INDEPENDENT_AMBULATORY_CARE_PROVIDER_SITE_OTHER): Payer: BLUE CROSS/BLUE SHIELD | Admitting: Nurse Practitioner

## 2016-08-20 VITALS — BP 130/80 | HR 77 | Temp 98.4°F | Ht 65.25 in | Wt 201.0 lb

## 2016-08-20 DIAGNOSIS — I10 Essential (primary) hypertension: Secondary | ICD-10-CM

## 2016-08-20 DIAGNOSIS — F411 Generalized anxiety disorder: Secondary | ICD-10-CM

## 2016-08-20 DIAGNOSIS — M545 Low back pain, unspecified: Secondary | ICD-10-CM

## 2016-08-20 DIAGNOSIS — K219 Gastro-esophageal reflux disease without esophagitis: Secondary | ICD-10-CM

## 2016-08-20 LAB — POCT URINALYSIS DIPSTICK
BILIRUBIN UA: NEGATIVE
Blood, UA: 10
Glucose, UA: NEGATIVE
KETONES UA: NEGATIVE
Leukocytes, UA: NEGATIVE
Nitrite, UA: NEGATIVE
PROTEIN UA: NEGATIVE
Urobilinogen, UA: 1 E.U./dL
pH, UA: 6 (ref 5.0–8.0)

## 2016-08-20 MED ORDER — FLUTICASONE PROPIONATE 50 MCG/ACT NA SUSP: 1.0000 | Freq: Every day | NASAL | 5 refills | Status: DC

## 2016-08-20 MED ORDER — OMEPRAZOLE MAGNESIUM 20 MG PO TBEC: 20.0000 mg | DELAYED_RELEASE_TABLET | Freq: Every day | ORAL | 5 refills | Status: DC | PRN

## 2016-08-20 MED ORDER — SERTRALINE HCL 25 MG PO TABS: 50.0000 mg | ORAL_TABLET | Freq: Every day | ORAL | 5 refills | Status: DC

## 2016-08-20 MED ORDER — TRIAMTERENE-HCTZ 37.5-25 MG PO TABS: 1.0000 | ORAL_TABLET | Freq: Every day | ORAL | 5 refills | Status: DC

## 2016-08-20 NOTE — Progress Notes (Signed)
Pre visit review using our clinic review tool, if applicable. No additional management support is needed unless otherwise documented below in the visit note. 

## 2016-08-20 NOTE — Progress Notes (Signed)
Subjective:  Patient ID: Abigail [name], female    DOB: 07-05-1970  Age: 46 y.o. MRN: [id number]  CC: Follow-up (follow up kidney funchtion/GYN did the lab)   Back Pain  This is a new problem. The current episode started in the past 7 days. The problem occurs intermittently. The problem has been waxing and waning since onset. The pain is present in the lumbar spine. The quality of the pain is described as aching. The pain does not radiate. The symptoms are aggravated by twisting. Pertinent negatives include no abdominal pain, bladder incontinence, bowel incontinence, dysuria, fever, numbness, tingling, weakness or weight loss. Risk factors include obesity, sedentary lifestyle and poor posture. She has tried NSAIDs for the symptoms. The treatment provided mild relief.   She was instructed by GYN to f/up with me due to low total protein and albumin.  HTN: Stable with maxzide.  Anxiety: Stable with zoloft.  GERD: Stable with omeprazole.  Palpitation: Resolved with elimination coffee.  Outpatient Medications Prior to Visit  Medication Sig Dispense Refill  . cetirizine (ZYRTEC) 10 MG tablet Take 10 mg by mouth daily.    . fexofenadine (ALLEGRA) 180 MG tablet Take 1 tablet (180 mg total) by mouth daily. 90 tablet 1  . oxymetazoline (AFRIN NASAL SPRAY) 0.05 % nasal spray Place 1 spray into both nostrils 2 (two) times daily. Use only for 3days, then stop 30 mL 0  . saccharomyces boulardii (FLORASTOR) 250 MG capsule Take 250 mg by mouth 2 (two) times daily.    . Vitamin D, Ergocalciferol, (DRISDOL) 50000 units CAPS capsule Take 1 capsule (50,000 Units total) by mouth every 7 (seven) days. 12 capsule 0  . fluticasone (FLONASE) 50 MCG/ACT nasal spray Place 1 spray into both nostrils daily. 16 g 2  . omeprazole (PRILOSEC OTC) 20 MG tablet Take 1 tablet (20 mg total) by mouth daily as needed. For heartburn 30 tablet 3  . sertraline (ZOLOFT) 25 MG tablet Take 2 tablets (50 mg total) by mouth  daily. 60 tablet 3  . triamterene-hydrochlorothiazide (MAXZIDE-25) 37.5-25 MG tablet Take 1 tablet by mouth daily. 30 tablet 3  . butalbital-acetaminophen-caffeine (FIORICET, ESGIC) 50-325-40 MG tablet Take 1 tablet by mouth every 6 (six) hours as needed for headache. (Patient not taking: Reported on 07/28/2016) 20 tablet 0  . EPINEPHrine 0.3 mg/0.3 mL IJ SOAJ injection Use as directed for severe allergic reaction (Patient not taking: Reported on 08/20/2016) 2 Device 1  . naproxen (NAPROSYN) 500 MG tablet Take 1 tablet (500 mg total) by mouth 2 (two) times daily. (Patient not taking: Reported on 07/28/2016) 30 tablet 0   No facility-administered medications prior to visit.     ROS See HPI  Objective:  BP 130/80   Pulse 77   Temp 98.4 F (36.9 C)   Ht 5' 5.25" (1.657 m)   Wt 201 lb (91.2 kg)   LMP 01/18/2012 (Exact Date)   SpO2 98%   BMI 33.19 kg/m   BP Readings from Last 3 Encounters:  08/20/16 130/80  07/28/16 136/90  07/10/16 130/80    Wt Readings from Last 3 Encounters:  08/20/16 201 lb (91.2 kg)  07/28/16 200 lb (90.7 kg)  07/10/16 199 lb 12.8 oz (90.6 kg)    Physical Exam  Constitutional: She is oriented to person, place, and time. No distress.  Neck: Normal range of motion. Neck supple.  Cardiovascular: Normal rate and normal heart sounds.   Pulmonary/Chest: Effort normal.  Abdominal: Soft. Bowel sounds are normal. There is no  tenderness.  Musculoskeletal: She exhibits no edema.  No CVA tenderness  Neurological: She is alert and oriented to person, place, and time.  Vitals reviewed.   Lab Results  Component Value Date   WBC 6.4 07/28/2016   HGB 14.4 07/28/2016   HCT 43.9 07/28/2016   PLT 305 07/28/2016   GLUCOSE 80 07/28/2016   CHOL 225 (H) 07/28/2016   TRIG 146 07/28/2016   HDL 55 07/28/2016   LDLCALC 141 (H) 07/28/2016   ALT 14 08/12/2016   AST 19 08/12/2016   NA 138 07/28/2016   K 4.3 07/28/2016   CL 101 07/28/2016   CREATININE 0.86 07/28/2016    BUN 10 07/28/2016   CO2 31 07/28/2016   TSH 0.62 07/10/2016   HGBA1C 5.6 07/28/2016    No results found.  Assessment & Plan:   Secily was seen today for follow-up.  Diagnoses and all orders for this visit:  Acute bilateral low back pain without sciatica -     POCT urinalysis dipstick -     Basic metabolic panel; Future  Generalized anxiety disorder -     sertraline (ZOLOFT) 25 MG tablet; Take 2 tablets (50 mg total) by mouth daily.  Essential hypertension -     Basic metabolic panel; Future -     triamterene-hydrochlorothiazide (MAXZIDE-25) 37.5-25 MG tablet; Take 1 tablet by mouth daily.  Gastroesophageal reflux disease, esophagitis presence not specified -     omeprazole (PRILOSEC OTC) 20 MG tablet; Take 1 tablet (20 mg total) by mouth daily as needed. For heartburn  Other orders -     fluticasone (FLONASE) 50 MCG/ACT nasal spray; Place 1 spray into both nostrils daily.   I have discontinued Ms. Patrie's naproxen, butalbital-acetaminophen-caffeine, and EPINEPHrine. I am also having her maintain her fexofenadine, oxymetazoline, cetirizine, saccharomyces boulardii, Vitamin D (Ergocalciferol), sertraline, fluticasone, triamterene-hydrochlorothiazide, and omeprazole.  Meds ordered this encounter  Medications  . sertraline (ZOLOFT) 25 MG tablet    Sig: Take 2 tablets (50 mg total) by mouth daily.    Dispense:  60 tablet    Refill:  5    Order Specific Question:   Supervising Provider    Answer:   Cassandria Anger [[id number]]  . fluticasone (FLONASE) 50 MCG/ACT nasal spray    Sig: Place 1 spray into both nostrils daily.    Dispense:  16 g    Refill:  5    Order Specific Question:   Supervising Provider    Answer:   Cassandria Anger [[id number]]  . triamterene-hydrochlorothiazide (MAXZIDE-25) 37.5-25 MG tablet    Sig: Take 1 tablet by mouth daily.    Dispense:  30 tablet    Refill:  5    Order Specific Question:   Supervising Provider    Answer:   Cassandria Anger  [[id number]]  . omeprazole (PRILOSEC OTC) 20 MG tablet    Sig: Take 1 tablet (20 mg total) by mouth daily as needed. For heartburn    Dispense:  30 tablet    Refill:  5    Order Specific Question:   Supervising Provider    Answer:   Cassandria Anger [[id number]]    Follow-up: Return in about 6 months (around 02/19/2017) for HTN and anxiety.  Wilfred Lacy, NP

## 2016-08-24 ENCOUNTER — Encounter: Payer: Self-pay | Admitting: Nurse Practitioner

## 2016-08-24 ENCOUNTER — Other Ambulatory Visit: Payer: Self-pay | Admitting: Nurse Practitioner

## 2016-08-24 MED ORDER — METRONIDAZOLE 500 MG PO TABS: 500.0000 mg | ORAL_TABLET | Freq: Two times a day (BID) | ORAL | 0 refills | Status: DC

## 2016-08-25 ENCOUNTER — Telehealth: Payer: Self-pay

## 2016-08-25 MED ORDER — METRONIDAZOLE 500 MG PO TABS: 500.0000 mg | ORAL_TABLET | Freq: Two times a day (BID) | ORAL | 0 refills | Status: DC
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
## 2016-10-09 NOTE — Addendum Note (Signed)
Addended by: Zoila Shutter D on: 10/09/2016 12:20 PM   Modules accepted: Orders

## 2016-10-30 ENCOUNTER — Other Ambulatory Visit (INDEPENDENT_AMBULATORY_CARE_PROVIDER_SITE_OTHER): Payer: BLUE CROSS/BLUE SHIELD

## 2016-10-30 DIAGNOSIS — E559 Vitamin D deficiency, unspecified: Secondary | ICD-10-CM

## 2016-10-31 LAB — VITAMIN D 25 HYDROXY (VIT D DEFICIENCY, FRACTURES): Vit D, 25-Hydroxy: 24.8 ng/mL — ABNORMAL LOW (ref 30.0–100.0)

## 2016-11-05 ENCOUNTER — Other Ambulatory Visit: Payer: Self-pay | Admitting: Nurse Practitioner

## 2016-11-05 NOTE — Telephone Encounter (Signed)
Medication refill request: Vitamin D 50,000 Last AEX:  07/28/16 PG Next AEX: not scheduled yet  Last MMG (if hormonal medication request): 12/262017 normal seen via portal on phone Refill authorized: 07/28/16 #12 w/0 refills; today please advise; does patient need blood work?

## 2016-12-30 ENCOUNTER — Encounter: Payer: Self-pay | Admitting: Obstetrics and Gynecology

## 2016-12-30 ENCOUNTER — Ambulatory Visit (INDEPENDENT_AMBULATORY_CARE_PROVIDER_SITE_OTHER): Payer: BLUE CROSS/BLUE SHIELD | Admitting: Obstetrics and Gynecology

## 2016-12-30 VITALS — BP 140/92 | HR 88 | Temp 97.9°F | Ht 65.25 in | Wt 205.8 lb

## 2016-12-30 DIAGNOSIS — N898 Other specified noninflammatory disorders of vagina: Secondary | ICD-10-CM

## 2016-12-30 DIAGNOSIS — R102 Pelvic and perineal pain: Secondary | ICD-10-CM

## 2016-12-30 LAB — POCT URINALYSIS DIPSTICK
BILIRUBIN UA: NEGATIVE
Blood, UA: NEGATIVE
GLUCOSE UA: NEGATIVE
Ketones, UA: NEGATIVE
LEUKOCYTES UA: NEGATIVE
NITRITE UA: NEGATIVE
PH UA: 5 (ref 5.0–8.0)
Protein, UA: NEGATIVE
Urobilinogen, UA: 0.2 E.U./dL

## 2016-12-30 NOTE — Patient Instructions (Signed)
Pelvic Pain, Female Pelvic pain is pain in your lower abdomen, below your belly button and between your hips. The pain may start suddenly (acute), keep coming back (recurring), or last a long time (chronic). Pelvic pain that lasts longer than six months is considered chronic. Pelvic pain may affect your:  Reproductive organs.  Urinary system.  Digestive tract.  Musculoskeletal system.  There are many potential causes of pelvic pain. Sometimes, the pain can be a result of digestive or urinary conditions, strained muscles or ligaments, or even reproductive conditions. Sometimes the cause of pelvic pain is not known. Follow these instructions at home:  Take over-the-counter and prescription medicines only as told by your health care provider.  Rest as told by your health care provider.  Do not have sex it if hurts.  Keep a journal of your pelvic pain. Write down: ? When the pain started. ? Where the pain is located. ? What seems to make the pain better or worse, such as food or your menstrual cycle. ? Any symptoms you have along with the pain.  Keep all follow-up visits as told by your health care provider. This is important. Contact a health care provider if:  Medicine does not help your pain.  Your pain comes back.  You have new symptoms.  You have abnormal vaginal discharge or bleeding, including bleeding after menopause.  You have a fever or chills.  You are constipated.  You have blood in your urine or stool.  You have foul-smelling urine.  You feel weak or lightheaded. Get help right away if:  You have sudden severe pain.  Your pain gets steadily worse.  You have severe pain along with fever, nausea, vomiting, or excessive sweating.  You lose consciousness. This information is not intended to replace advice given to you by your health care provider. Make sure you discuss any questions you have with your health care provider. Document Released: 03/24/2004  Document Revised: 05/22/2015 Document Reviewed: 02/15/2015 Elsevier Interactive Patient Education  2018 Elsevier Inc.  
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
## 2016-12-31 ENCOUNTER — Telehealth: Payer: Self-pay | Admitting: Obstetrics and Gynecology

## 2016-12-31 ENCOUNTER — Encounter: Payer: Self-pay | Admitting: Obstetrics and Gynecology

## 2016-12-31 LAB — VAGINITIS/VAGINOSIS, DNA PROBE
CANDIDA SPECIES: NEGATIVE
GARDNERELLA VAGINALIS: POSITIVE — AB
TRICHOMONAS VAG: NEGATIVE

## 2016-12-31 NOTE — Telephone Encounter (Signed)
Patient returned call for scheduling recommended ultrasound. Ultrasound is scheduled 01/05/17 with Dr Quincy Simmonds. Patient ask to have a nurse call her in regards to medication. Advised I will forward to Triage Nurse for return call. Patient is agreeable.  Routing to Triage Nurse

## 2016-12-31 NOTE — Telephone Encounter (Signed)
Left message to call Abigail [name] at [phone]. 

## 2016-12-31 NOTE — Telephone Encounter (Signed)
Notes recorded by Nunzio Cobbs, MD on 12/31/2016 at 1:40 PM EDT Please inform of Affirm result showing bacterial vaginosis. She may treat with Flagyl 500 mg po bid for 7 days or Metrogel pv at hs for 5 nights.  Please send Rx to pharmacy of choice. ETOH precautions.

## 2017-01-01 MED ORDER — METRONIDAZOLE 0.75 % VA GEL: 1.0000 | Freq: Every day | VAGINAL | 0 refills | Status: DC

## 2017-01-01 NOTE — Telephone Encounter (Signed)
Spoke with patient. Results given. Patient verbalizes understanding and would like to start on Metrogel. Rx for Metrogel place 1 applicator vaginally at bedtime for 5 nights sent to pharmacy on file. Patient is agreeable.

## 2017-01-01 NOTE — Telephone Encounter (Signed)
Patient returned call to Kaitlyn. °

## 2017-01-03 ENCOUNTER — Encounter: Payer: Self-pay | Admitting: Obstetrics and Gynecology

## 2017-01-04 ENCOUNTER — Telehealth: Payer: Self-pay | Admitting: Obstetrics and Gynecology

## 2017-01-04 ENCOUNTER — Encounter: Payer: Self-pay | Admitting: Obstetrics and Gynecology

## 2017-01-04 NOTE — Telephone Encounter (Signed)
Unable to close encounter due to clearance routing to triage for closure.

## 2017-01-04 NOTE — Telephone Encounter (Signed)
Patient left voicemail Sunday afternoon that she wants to cancel her ultrasound for Tuesday and does not wish to reschedule.  States the pain has resolved.  Patient also sent MyChart message please see below.   ===View-only below this line===  ----- Message -----    From: Donato Schultz    Sent: 01/03/2017  4:34 PM EDT      To: Arloa Koh, MD Subject: Visit Follow-Up Question  Please let Dr Josefa Half know that I cancelled U/S for Tues(left vmail on general mailbox today) the pain seems to be gone for now. Thank you for getting that scheduled so quickly. If it becomes a problem again will f\ up once insurance kicks in.

## 2017-01-04 NOTE — Telephone Encounter (Signed)
Routing to Dr. Antony Blackbird. Will close encounter.

## 2017-01-05 ENCOUNTER — Other Ambulatory Visit: Payer: BLUE CROSS/BLUE SHIELD

## 2017-01-05 ENCOUNTER — Other Ambulatory Visit: Payer: BLUE CROSS/BLUE SHIELD | Admitting: Obstetrics and Gynecology

## 2017-02-14 ENCOUNTER — Other Ambulatory Visit: Payer: Self-pay | Admitting: Nurse Practitioner

## 2017-02-14 DIAGNOSIS — I10 Essential (primary) hypertension: Secondary | ICD-10-CM

## 2017-02-15 NOTE — Telephone Encounter (Signed)
Pt needs to see PCP for more refills. Please help call and offer an appt before rx run out.

## 2017-02-15 NOTE — Telephone Encounter (Signed)
Spoke with pt to let her know. She said that she will call back to schedule when needed.

## 2017-03-19 ENCOUNTER — Ambulatory Visit: Payer: BLUE CROSS/BLUE SHIELD | Admitting: Nurse Practitioner

## 2017-03-19 ENCOUNTER — Encounter: Payer: Self-pay | Admitting: Nurse Practitioner

## 2017-03-19 ENCOUNTER — Ambulatory Visit (INDEPENDENT_AMBULATORY_CARE_PROVIDER_SITE_OTHER)
Admission: RE | Admit: 2017-03-19 | Discharge: 2017-03-19 | Disposition: A | Discharge status: Home / Self Care | Payer: BLUE CROSS/BLUE SHIELD | Source: Ambulatory Visit | Attending: Nurse Practitioner | Admitting: Nurse Practitioner

## 2017-03-19 VITALS — BP 142/88 | HR 96 | Temp 98.2°F | Ht 65.25 in | Wt 199.0 lb

## 2017-03-19 DIAGNOSIS — K219 Gastro-esophageal reflux disease without esophagitis: Secondary | ICD-10-CM | POA: Diagnosis not present

## 2017-03-19 DIAGNOSIS — R350 Frequency of micturition: Secondary | ICD-10-CM

## 2017-03-19 DIAGNOSIS — M545 Low back pain, unspecified: Secondary | ICD-10-CM

## 2017-03-19 DIAGNOSIS — G8929 Other chronic pain: Secondary | ICD-10-CM

## 2017-03-19 DIAGNOSIS — K5900 Constipation, unspecified: Secondary | ICD-10-CM

## 2017-03-19 LAB — POCT URINALYSIS DIPSTICK
Bilirubin, UA: NEGATIVE
Blood, UA: NEGATIVE
Glucose, UA: NEGATIVE
KETONES UA: NEGATIVE
LEUKOCYTES UA: NEGATIVE
Nitrite, UA: NEGATIVE
PROTEIN UA: NEGATIVE
Spec Grav, UA: 1.015 (ref 1.010–1.025)
UROBILINOGEN UA: 0.2 U/dL
pH, UA: 7 (ref 5.0–8.0)

## 2017-03-19 MED ORDER — OMEPRAZOLE 40 MG PO CPDR: 40.0000 mg | DELAYED_RELEASE_CAPSULE | Freq: Every day | ORAL | 5 refills | Status: DC

## 2017-03-19 MED ORDER — LINACLOTIDE 145 MCG PO CAPS: 145.0000 ug | ORAL_CAPSULE | Freq: Every day | ORAL | 0 refills | Status: DC

## 2017-03-19 NOTE — Progress Notes (Signed)
Subjective:  Patient ID: Abigail [name], female    DOB: [birth year]  Age: 46 y.o. MRN: [id number]  CC: Urinary Tract Infection (frequent urination,lower back pain- going on for 3 mo)   Urinary Tract Infection   This is a new problem. The current episode started more than 1 month ago. The problem occurs every urination. The problem has been unchanged. The pain is at a severity of 0/10. The patient is experiencing no pain. There has been no fever. She is sexually active. There is no history of pyelonephritis. Associated symptoms include frequency. Pertinent negatives include no chills, discharge, flank pain, hematuria, hesitancy, nausea, possible pregnancy, urgency or vomiting. She has tried increased fluids for the symptoms. The treatment provided no relief. There is no history of catheterization or urinary stasis.   Back Pain: Chronic, waxing and waning, denies any injury, moderate improvement with tylenol and NSAIDs.  Constipation: Chronic, no improvement with OTC medications. No melena, no hematochezia, no weight loss, no rectal pain.  GERD: Uncontrolled with omeprazole OTC prn. Has not made any chnages to medications.  Outpatient Medications Prior to Visit  Medication Sig Dispense Refill  . cetirizine (ZYRTEC) 10 MG tablet Take 10 mg by mouth daily.    . fexofenadine (ALLEGRA) 180 MG tablet Take 1 tablet (180 mg total) by mouth daily. 90 tablet 1  . fluticasone (FLONASE) 50 MCG/ACT nasal spray Place 1 spray into both nostrils daily. 16 g 5  . saccharomyces boulardii (FLORASTOR) 250 MG capsule Take 250 mg by mouth 2 (two) times daily.    Marland Kitchen triamterene-hydrochlorothiazide (MAXZIDE-25) 37.5-25 MG tablet Take 1 tablet by mouth daily. See PCP for more refills. 30 tablet 0  . Vitamin D, Ergocalciferol, (DRISDOL) 50000 units CAPS capsule TAKE 1 CAPSULE BY MOUTH EVERY 7 DAYS 30 capsule 1  . omeprazole (PRILOSEC OTC) 20 MG tablet Take 1 tablet (20 mg total) by mouth daily as needed. For  heartburn 30 tablet 5  . metroNIDAZOLE (METROGEL) 0.75 % vaginal gel Place 1 Applicatorful vaginally at bedtime. X 5 nights (Patient not taking: Reported on 03/19/2017) 70 g 0  . sertraline (ZOLOFT) 25 MG tablet Take 2 tablets (50 mg total) by mouth daily. (Patient not taking: Reported on 12/30/2016) 60 tablet 5   No facility-administered medications prior to visit.     ROS Review of Systems  Constitutional: Negative for chills, fever, malaise/fatigue and weight loss.  Gastrointestinal: Positive for constipation and heartburn. Negative for abdominal pain, blood in stool, diarrhea, melena, nausea and vomiting.  Genitourinary: Positive for frequency. Negative for flank pain, hematuria, hesitancy and urgency.  Musculoskeletal: Negative for falls.    Objective:  BP (!) 142/88   Pulse 96   Temp 98.2 F (36.8 C)   Ht 5' 5.25" (1.657 m)   Wt 199 lb (90.3 kg)   LMP 01/18/2012 (Exact Date)   SpO2 98%   BMI 32.86 kg/m   BP Readings from Last 3 Encounters:  03/19/17 (!) 142/88  12/30/16 (!) 140/92  08/20/16 130/80    Wt Readings from Last 3 Encounters:  03/19/17 199 lb (90.3 kg)  12/30/16 205 lb 12.8 oz (93.4 kg)  08/20/16 201 lb (91.2 kg)    Physical Exam  Constitutional: She is oriented to person, place, and time.  Cardiovascular: Normal rate and regular rhythm.  Pulmonary/Chest: Effort normal and breath sounds normal.  Abdominal: Soft. Bowel sounds are normal. She exhibits no distension. There is no tenderness.  Musculoskeletal: She exhibits no edema or tenderness.  Neurological: She is  alert and oriented to person, place, and time.  Skin: Skin is warm and dry. No rash noted. No erythema.  Vitals reviewed.   Lab Results  Component Value Date   WBC 6.4 07/28/2016   HGB 14.4 07/28/2016   HCT 43.9 07/28/2016   PLT 305 07/28/2016   GLUCOSE 80 07/28/2016   CHOL 225 (H) 07/28/2016   TRIG 146 07/28/2016   HDL 55 07/28/2016   LDLCALC 141 (H) 07/28/2016   ALT 14 08/12/2016     AST 19 08/12/2016   NA 138 07/28/2016   K 4.3 07/28/2016   CL 101 07/28/2016   CREATININE 0.86 07/28/2016   BUN 10 07/28/2016   CO2 31 07/28/2016   TSH 0.62 07/10/2016   HGBA1C 5.6 07/28/2016    Assessment & Plan:   Abigail [name] was seen today for urinary tract infection.  Diagnoses and all orders for this visit:  Frequent urination -     POCT urinalysis dipstick -     Ambulatory referral to Urology  Gastroesophageal reflux disease, esophagitis presence not specified -     omeprazole (PRILOSEC) 40 MG capsule; Take 1 capsule (40 mg total) daily by mouth.  Chronic midline low back pain without sciatica -     DG Lumbar Spine 2-3 Views; Future -     meloxicam (MOBIC) 7.5 MG tablet; Take 1 tablet (7.5 mg total) daily by mouth. With food -     methocarbamol (ROBAXIN) 500 MG tablet; Take 1 tablet (500 mg total) every 8 (eight) hours as needed by mouth for muscle spasms.  Constipation, unspecified constipation type -     linaclotide (LINZESS) 145 MCG CAPS capsule; Take 1 capsule (145 mcg total) daily before breakfast by mouth.   I have discontinued Abigail [name]'s omeprazole. I am also having her start on linaclotide, omeprazole, meloxicam, and methocarbamol. Additionally, I am having her maintain her fexofenadine, cetirizine, saccharomyces boulardii, sertraline, fluticasone, Vitamin D (Ergocalciferol), metroNIDAZOLE, and triamterene-hydrochlorothiazide.  Meds ordered this encounter  Medications  . linaclotide (LINZESS) 145 MCG CAPS capsule    Sig: Take 1 capsule (145 mcg total) daily before breakfast by mouth.    Dispense:  8 capsule    Refill:  0    Order Specific Question:   Supervising Provider    Answer:   Cassandria Anger [[id number]]  . omeprazole (PRILOSEC) 40 MG capsule    Sig: Take 1 capsule (40 mg total) daily by mouth.    Dispense:  30 capsule    Refill:  5    Order Specific Question:   Supervising Provider    Answer:   Cassandria Anger [[id number]]  . meloxicam (MOBIC)  7.5 MG tablet    Sig: Take 1 tablet (7.5 mg total) daily by mouth. With food    Dispense:  20 tablet    Refill:  0    Order Specific Question:   Supervising Provider    Answer:   Cassandria Anger [[id number]]  . methocarbamol (ROBAXIN) 500 MG tablet    Sig: Take 1 tablet (500 mg total) every 8 (eight) hours as needed by mouth for muscle spasms.    Dispense:  21 tablet    Refill:  0    Order Specific Question:   Supervising Provider    Answer:   Cassandria Anger [[id number]]    Follow-up: Return in about 4 weeks (around 04/16/2017) for constipation, GERD and back pain.  Wilfred Lacy, NP

## 2017-03-19 NOTE — Patient Instructions (Addendum)
You will be contacted to schedule appt with urology. Urinalysis was normal.  Normal spine except mild scoliosis. I recommend managing back pain with back exercise, NSAIDs and muscle relaxants.  X-ray also indicates small left renal stone. It is non obstructing. F/up with urology when scheduled.  Constipation, Adult Constipation is when a person:  Poops (has a bowel movement) fewer times in a week than normal.  Has a hard time pooping.  Has poop that is dry, hard, or bigger than normal.  Follow these instructions at home: Eating and drinking   Eat foods that have a lot of fiber, such as: ? Fresh fruits and vegetables. ? Whole grains. ? Beans.  Eat less of foods that are high in fat, low in fiber, or overly processed, such as: ? [place] fries. ? Hamburgers. ? Cookies. ? Candy. ? Soda.  Drink enough fluid to keep your pee (urine) clear or pale yellow. General instructions  Exercise regularly or as told by your doctor.  Go to the restroom when you feel like you need to poop. Do not hold it in.  Take over-the-counter and prescription medicines only as told by your doctor. These include any fiber supplements.  Do pelvic floor retraining exercises, such as: ? Doing deep breathing while relaxing your lower belly (abdomen). ? Relaxing your pelvic floor while pooping.  Watch your condition for any changes.  Keep all follow-up visits as told by your doctor. This is important. Contact a doctor if:  You have pain that gets worse.  You have a fever.  You have not pooped for 4 days.  You throw up (vomit).  You are not hungry.  You lose weight.  You are bleeding from the anus.  You have thin, pencil-like poop (stool). Get help right away if:  You have a fever, and your symptoms suddenly get worse.  You leak poop or have blood in your poop.  Your belly feels hard or bigger than normal (is bloated).  You have very bad belly pain.  You feel dizzy or you  faint. This information is not intended to replace advice given to you by your health care provider. Make sure you discuss any questions you have with your health care provider. Document Released: 10/14/2007 Document Revised: 11/15/2015 Document Reviewed: 10/16/2015 Elsevier Interactive Patient Education  2017 [employer].

## 2017-03-21 MED ORDER — METHOCARBAMOL 500 MG PO TABS: 500.0000 mg | ORAL_TABLET | Freq: Three times a day (TID) | ORAL | 0 refills | Status: DC | PRN

## 2017-03-21 MED ORDER — MELOXICAM 7.5 MG PO TABS: 7.5000 mg | ORAL_TABLET | Freq: Every day | ORAL | 0 refills | Status: DC

## 2017-04-08 ENCOUNTER — Other Ambulatory Visit: Payer: Self-pay | Admitting: Nurse Practitioner

## 2017-04-08 ENCOUNTER — Encounter: Payer: Self-pay | Admitting: Nurse Practitioner

## 2017-04-08 DIAGNOSIS — K5900 Constipation, unspecified: Secondary | ICD-10-CM

## 2017-04-08 MED ORDER — LINACLOTIDE 145 MCG PO CAPS: 145.0000 ug | ORAL_CAPSULE | Freq: Every day | ORAL | 1 refills | Status: DC

## 2017-05-07 ENCOUNTER — Encounter: Payer: Self-pay | Admitting: Nurse Practitioner

## 2017-05-07 ENCOUNTER — Ambulatory Visit (INDEPENDENT_AMBULATORY_CARE_PROVIDER_SITE_OTHER): Payer: BLUE CROSS/BLUE SHIELD

## 2017-05-07 ENCOUNTER — Ambulatory Visit: Payer: BLUE CROSS/BLUE SHIELD | Admitting: Nurse Practitioner

## 2017-05-07 VITALS — BP 130/90 | HR 86 | Temp 98.3°F | Ht 65.25 in | Wt 205.0 lb

## 2017-05-07 DIAGNOSIS — R109 Unspecified abdominal pain: Secondary | ICD-10-CM | POA: Diagnosis not present

## 2017-05-07 DIAGNOSIS — N3001 Acute cystitis with hematuria: Secondary | ICD-10-CM

## 2017-05-07 LAB — POCT URINALYSIS DIPSTICK
BILIRUBIN UA: NEGATIVE
GLUCOSE UA: NEGATIVE
Ketones, UA: NEGATIVE
Nitrite, UA: POSITIVE
Protein, UA: 15
Spec Grav, UA: 1.015 (ref 1.010–1.025)
Urobilinogen, UA: 0.2 E.U./dL
pH, UA: 7 (ref 5.0–8.0)

## 2017-05-07 MED ORDER — CEFTRIAXONE SODIUM 500 MG IJ SOLR
500.0000 mg | Freq: Once | INTRAMUSCULAR | Status: AC
  Administered 2017-05-07: 500 mg via INTRAMUSCULAR

## 2017-05-07 MED ORDER — CIPROFLOXACIN HCL 500 MG PO TABS: 500.0000 mg | ORAL_TABLET | Freq: Two times a day (BID) | ORAL | 0 refills | Status: DC

## 2017-05-07 MED ORDER — PHENAZOPYRIDINE HCL 100 MG PO TABS: 100.0000 mg | ORAL_TABLET | Freq: Three times a day (TID) | ORAL | 0 refills | Status: DC | PRN

## 2017-05-07 MED ORDER — KETOROLAC TROMETHAMINE 30 MG/ML IJ SOLN
30.0000 mg | Freq: Once | INTRAMUSCULAR | Status: AC
  Administered 2017-05-07: 30 mg via INTRAMUSCULAR

## 2017-05-07 NOTE — Progress Notes (Signed)
Subjective:  Patient ID: Abigail [name], female    DOB: 1970/07/10  Age: 46 y.o. MRN: [id number]  CC: Urinary Tract Infection (lower pelvic pain,burning when urinate,odor,frequent--going on 3 days)   Urinary Tract Infection   This is a new problem. The current episode started in the past 7 days. The problem occurs every urination. The problem has been rapidly worsening. The quality of the pain is described as burning and aching. The pain is severe. She is sexually active. There is no history of pyelonephritis. Associated symptoms include chills, flank pain, frequency, hematuria, nausea and urgency. Pertinent negatives include no discharge, hesitancy, possible pregnancy, sweats or vomiting. Abigail [name] past medical history is significant for kidney stones. There is no history of recurrent UTIs or a urological procedure.  has appt with urology next month.  Outpatient Medications Prior to Visit  Medication Sig Dispense Refill  . cetirizine (ZYRTEC) 10 MG tablet Take 10 mg by mouth daily.    . fexofenadine (ALLEGRA) 180 MG tablet Take 1 tablet (180 mg total) by mouth daily. 90 tablet 1  . fluticasone (FLONASE) 50 MCG/ACT nasal spray Place 1 spray into both nostrils daily. 16 g 5  . linaclotide (LINZESS) 145 MCG CAPS capsule Take 1 capsule (145 mcg total) by mouth daily before breakfast. 90 capsule 1  . meloxicam (MOBIC) 7.5 MG tablet Take 1 tablet (7.5 mg total) daily by mouth. With food 20 tablet 0  . methocarbamol (ROBAXIN) 500 MG tablet Take 1 tablet (500 mg total) every 8 (eight) hours as needed by mouth for muscle spasms. 21 tablet 0  . metroNIDAZOLE (METROGEL) 0.75 % vaginal gel Place 1 Applicatorful vaginally at bedtime. X 5 nights 70 g 0  . omeprazole (PRILOSEC) 40 MG capsule Take 1 capsule (40 mg total) daily by mouth. 30 capsule 5  . saccharomyces boulardii (FLORASTOR) 250 MG capsule Take 250 mg by mouth 2 (two) times daily.    . sertraline (ZOLOFT) 25 MG tablet Take 2 tablets (50 mg total) by  mouth daily. 60 tablet 5  . triamterene-hydrochlorothiazide (MAXZIDE-25) 37.5-25 MG tablet Take 1 tablet by mouth daily. See PCP for more refills. 30 tablet 0  . Vitamin D, Ergocalciferol, (DRISDOL) 50000 units CAPS capsule TAKE 1 CAPSULE BY MOUTH EVERY 7 DAYS 30 capsule 1   No facility-administered medications prior to visit.     ROS See HPI  Objective:  BP 130/90   Pulse 86   Temp 98.3 F (36.8 C)   Ht 5' 5.25" (1.657 m)   Wt 205 lb (93 kg)   LMP 01/18/2012 (Exact Date)   SpO2 99%   BMI 33.85 kg/m   BP Readings from Last 3 Encounters:  05/07/17 130/90  03/19/17 (!) 142/88  12/30/16 (!) 140/92    Wt Readings from Last 3 Encounters:  05/07/17 205 lb (93 kg)  03/19/17 199 lb (90.3 kg)  12/30/16 205 lb 12.8 oz (93.4 kg)    Physical Exam  Constitutional: She is oriented to person, place, and time.  Appears restless  Cardiovascular: Normal rate.  Pulmonary/Chest: Effort normal.  Abdominal: Soft. She exhibits no distension. There is tenderness. There is no guarding.  Bilateral CVA tenerness  Neurological: She is alert and oriented to person, place, and time.  Vitals reviewed.   Lab Results  Component Value Date   WBC 6.4 07/28/2016   HGB 14.4 07/28/2016   HCT 43.9 07/28/2016   PLT 305 07/28/2016   GLUCOSE 80 07/28/2016   CHOL 225 (H) 07/28/2016   TRIG  146 07/28/2016   HDL 55 07/28/2016   LDLCALC 141 (H) 07/28/2016   ALT 14 08/12/2016   AST 19 08/12/2016   NA 138 07/28/2016   K 4.3 07/28/2016   CL 101 07/28/2016   CREATININE 0.86 07/28/2016   BUN 10 07/28/2016   CO2 31 07/28/2016   TSH 0.62 07/10/2016   HGBA1C 5.6 07/28/2016    Dg Lumbar Spine 2-3 Views  Result Date: 03/19/2017 CLINICAL DATA:  Midline low back pain chronic EXAM: LUMBAR SPINE - 2-3 VIEW COMPARISON:  01/23/2012 FINDINGS: Mild levoscoliosis at L4. Normal sagittal alignment. Negative for disc degeneration or spurring. No fracture or pars defect. Small calcifications overlying the left lower  pole compatible with renal calculi which have progressed since the prior study. IMPRESSION: Mild lumbar levoscoliosis.  No significant degenerative change. Left lower pole renal calculi. Electronically Signed   By: Franchot Gallo M.D.   On: 03/19/2017 16:11    Assessment & Plan:   Abigail [name] was seen today for urinary tract infection.  Diagnoses and all orders for this visit:  Acute cystitis with hematuria -     POCT urinalysis dipstick -     Urine Culture -     DG Abd 1 View; Future -     ketorolac (TORADOL) 30 MG/ML injection 30 mg -     cefTRIAXone (ROCEPHIN) injection 500 mg -     phenazopyridine (PYRIDIUM) 100 MG tablet; Take 1 tablet (100 mg total) by mouth 3 (three) times daily as needed for pain (with food). -     ciprofloxacin (CIPRO) 500 MG tablet; Take 1 tablet (500 mg total) by mouth 2 (two) times daily.  Flank pain, acute -     POCT urinalysis dipstick -     Urine Culture -     DG Abd 1 View; Future -     ketorolac (TORADOL) 30 MG/ML injection 30 mg -     cefTRIAXone (ROCEPHIN) injection 500 mg -     phenazopyridine (PYRIDIUM) 100 MG tablet; Take 1 tablet (100 mg total) by mouth 3 (three) times daily as needed for pain (with food). -     ciprofloxacin (CIPRO) 500 MG tablet; Take 1 tablet (500 mg total) by mouth 2 (two) times daily.  Other orders -     Extra Urine Specimen   I am having Abigail [name] start on phenazopyridine and ciprofloxacin. I am also having Abigail [name] maintain Abigail [name] fexofenadine, cetirizine, saccharomyces boulardii, sertraline, fluticasone, Vitamin D (Ergocalciferol), metroNIDAZOLE, triamterene-hydrochlorothiazide, omeprazole, meloxicam, methocarbamol, and linaclotide. We administered ketorolac and cefTRIAXone.  Meds ordered this encounter  Medications  . ketorolac (TORADOL) 30 MG/ML injection 30 mg  . cefTRIAXone (ROCEPHIN) injection 500 mg  . phenazopyridine (PYRIDIUM) 100 MG tablet    Sig: Take 1 tablet (100 mg total) by mouth 3 (three) times daily as  needed for pain (with food).    Dispense:  10 tablet    Refill:  0    Order Specific Question:   Supervising Provider    Answer:   Lucille Passy [[id number]]  . ciprofloxacin (CIPRO) 500 MG tablet    Sig: Take 1 tablet (500 mg total) by mouth 2 (two) times daily.    Dispense:  10 tablet    Refill:  0    Order Specific Question:   Supervising Provider    Answer:   Lucille Passy [[id number]]    Follow-up: Return if symptoms worsen or fail to improve.  Wilfred Lacy, NP

## 2017-05-07 NOTE — Patient Instructions (Signed)
Encourage adequate oral hydration.  Maintain appt with urology.  Return to office if no improvement in 2days.

## 2017-05-10 LAB — URINE CULTURE
MICRO NUMBER:: 81457369
SPECIMEN QUALITY: ADEQUATE

## 2017-05-10 LAB — EXTRA URINE SPECIMEN

## 2017-06-23 ENCOUNTER — Encounter: Payer: Self-pay | Admitting: Nurse Practitioner

## 2017-06-23 ENCOUNTER — Ambulatory Visit: Payer: Self-pay | Admitting: *Deleted

## 2017-06-23 ENCOUNTER — Ambulatory Visit: Payer: BLUE CROSS/BLUE SHIELD | Admitting: Nurse Practitioner

## 2017-06-23 VITALS — BP 146/92 | HR 83 | Temp 98.7°F | Ht 62.25 in | Wt 208.0 lb

## 2017-06-23 DIAGNOSIS — I10 Essential (primary) hypertension: Secondary | ICD-10-CM | POA: Diagnosis not present

## 2017-06-23 DIAGNOSIS — R51 Headache: Secondary | ICD-10-CM

## 2017-06-23 DIAGNOSIS — R519 Headache, unspecified: Secondary | ICD-10-CM

## 2017-06-23 MED ORDER — AMLODIPINE BESYLATE 5 MG PO TABS: 5.0000 mg | ORAL_TABLET | Freq: Every day | ORAL | 0 refills | Status: DC

## 2017-06-23 MED ORDER — KETOROLAC TROMETHAMINE 30 MG/ML IJ SOLN
30.0000 mg | Freq: Once | INTRAMUSCULAR | Status: AC
  Administered 2017-06-23: 30 mg via INTRAMUSCULAR

## 2017-06-23 MED ORDER — PROMETHAZINE HCL 25 MG PO TABS: 25.0000 mg | ORAL_TABLET | Freq: Three times a day (TID) | ORAL | 0 refills | Status: DC | PRN

## 2017-06-23 NOTE — Telephone Encounter (Signed)
Called in c/o a headache that started Saturday on the left side of her head around the temple and eye area.  She is also c/o nausea with the headache.    Monday night she woke up with the headache and her heart "felt like it was pounding".  It resolved on its own.   She has had that happen before and it ended up being a panic attack after being evaluated in the ED. She had a co worker take her BP manually while I held on the phone.   It was 150/102.   She forgot to take her medicine this morning for BP so she is going to take it now.   But she has been taking her BP medication regularly and still having the headache.  I  Made an appt with Wilfred Lacy, NP for today at 4:15.   Reason for Disposition . [1] MILD-MODERATE headache AND [2] present > 72 hours  Answer Assessment - Initial Assessment Questions 1. LOCATION: "Where does it hurt?"      Headache on left temple area around my eye and my sinuses.  No runny nose.   Monday night headache started and my heart was pounding.   I've had this before but it went away by itself.  I'm feeling nauseas when I eat. 2. ONSET: "When did the headache start?" (Minutes, hours or days)      Started Saturday and became worse on Monday. 3. PATTERN: "Does the pain come and go, or has it been constant since it started?"     Constant 4. SEVERITY: "How bad is the pain?" and "What does it keep you from doing?"  (e.g., Scale 1-10; mild, moderate, or severe)   - MILD (1-3): doesn't interfere with normal activities    - MODERATE (4-7): interferes with normal activities or awakens from sleep    - SEVERE (8-10): excruciating pain, unable to do any normal activities        4 on pain scale.  I'm at work and it's annoying. 5. RECURRENT SYMPTOM: "Have you ever had headaches before?" If so, ask: "When was the last time?" and "What happened that time?"      Yes.   I felt like it was a panic attack.  I went to the ED because thought I was having a heart attack but it was a  panic attack. 6. CAUSE: "What do you think is causing the headache?"     Panic attack maybe but not sure.   I'm feeling weak. 7. MIGRAINE: "Have you been diagnosed with migraine headaches?" If so, ask: "Is this headache similar?"      I take BP medication.   No migraine history but sinusitis that I take Allegra for.   That always bothers me on the right side not the left. 8. HEAD INJURY: "Has there been any recent injury to the head?"      No injuries 9. OTHER SYMPTOMS: "Do you have any other symptoms?" (fever, stiff neck, eye pain, sore throat, cold symptoms)     A little sensitive to light.   My head was hurting at the base of my neck to my left side of my head the other day but not today.   Today it's just in the left frontal area.   I feel nauseas with the headache. 10. PREGNANCY: "Is there any chance you are pregnant?" "When was your last menstrual period?"       No.  Protocols used: HEADACHE-A-AH

## 2017-06-23 NOTE — Progress Notes (Signed)
Subjective:  Patient ID: Abigail [name], female    DOB: 1971/03/12  Age: 47 y.o. MRN: [id number]  CC: Headache (headache left side forhead,nausea,weakness/ 4 days. BP took today was 150/102 before taken her med. ) and Medication Refill (meloxicam and robaxin refills? still has problem with back pain)   Headache   This is a new problem. The current episode started in the past 7 days. The problem occurs constantly. The problem has been unchanged. The pain is located in the left unilateral region. The pain radiates to the left neck. The pain quality is not similar to prior headaches. The quality of the pain is described as aching, dull and throbbing. Associated symptoms include eye watering and photophobia. Pertinent negatives include no abnormal behavior, blurred vision, dizziness, drainage, eye redness, insomnia, rhinorrhea, scalp tenderness, seizures, sinus pressure, tingling, tinnitus or weight loss. The symptoms are aggravated by unknown. She has tried NSAIDs for the symptoms. The treatment provided no relief. Her past medical history is significant for hypertension. There is no history of cluster headaches, migraine headaches, obesity or recent head traumas.   HTN: Also reports elevated BP reading with current symptoms. Reports she is taking Maxzide as prescribed. BP Readings from Last 3 Encounters:  06/23/17 (!) 146/92  05/07/17 130/90  03/19/17 (!) 142/88    Outpatient Medications Prior to Visit  Medication Sig Dispense Refill  . cetirizine (ZYRTEC) 10 MG tablet Take 10 mg by mouth daily.    . fexofenadine (ALLEGRA) 180 MG tablet Take 1 tablet (180 mg total) by mouth daily. 90 tablet 1  . fluticasone (FLONASE) 50 MCG/ACT nasal spray Place 1 spray into both nostrils daily. 16 g 5  . linaclotide (LINZESS) 145 MCG CAPS capsule Take 1 capsule (145 mcg total) by mouth daily before breakfast. 90 capsule 1  . meloxicam (MOBIC) 7.5 MG tablet Take 1 tablet (7.5 mg total) daily by mouth. With  food 20 tablet 0  . methocarbamol (ROBAXIN) 500 MG tablet Take 1 tablet (500 mg total) every 8 (eight) hours as needed by mouth for muscle spasms. 21 tablet 0  . omeprazole (PRILOSEC) 40 MG capsule Take 1 capsule (40 mg total) daily by mouth. 30 capsule 5  . saccharomyces boulardii (FLORASTOR) 250 MG capsule Take 250 mg by mouth 2 (two) times daily.    Marland Kitchen triamterene-hydrochlorothiazide (MAXZIDE-25) 37.5-25 MG tablet Take 1 tablet by mouth daily. See PCP for more refills. 30 tablet 0  . Vitamin D, Ergocalciferol, (DRISDOL) 50000 units CAPS capsule TAKE 1 CAPSULE BY MOUTH EVERY 7 DAYS 30 capsule 1  . ciprofloxacin (CIPRO) 500 MG tablet Take 1 tablet (500 mg total) by mouth 2 (two) times daily. (Patient not taking: Reported on 06/23/2017) 10 tablet 0  . metroNIDAZOLE (METROGEL) 0.75 % vaginal gel Place 1 Applicatorful vaginally at bedtime. X 5 nights (Patient not taking: Reported on 06/23/2017) 70 g 0  . phenazopyridine (PYRIDIUM) 100 MG tablet Take 1 tablet (100 mg total) by mouth 3 (three) times daily as needed for pain (with food). (Patient not taking: Reported on 06/23/2017) 10 tablet 0  . sertraline (ZOLOFT) 25 MG tablet Take 2 tablets (50 mg total) by mouth daily. (Patient not taking: Reported on 06/23/2017) 60 tablet 5   No facility-administered medications prior to visit.     ROS See HPI  Objective:  BP (!) 146/92   Pulse 83   Temp 98.7 F (37.1 C)   Ht 5' 2.25" (1.581 m)   Wt 208 lb (94.3 kg)   LMP  01/18/2012 (Exact Date)   SpO2 98%   BMI 37.74 kg/m   BP Readings from Last 3 Encounters:  06/23/17 (!) 146/92  05/07/17 130/90  03/19/17 (!) 142/88    Wt Readings from Last 3 Encounters:  06/23/17 208 lb (94.3 kg)  05/07/17 205 lb (93 kg)  03/19/17 199 lb (90.3 kg)    Physical Exam  Constitutional: She is oriented to person, place, and time. No distress.  HENT:  Right Ear: Hearing, tympanic membrane, external ear and ear canal normal.  Left Ear: Hearing, tympanic membrane,  external ear and ear canal normal.  Nose: Right sinus exhibits no maxillary sinus tenderness and no frontal sinus tenderness. Left sinus exhibits no maxillary sinus tenderness and no frontal sinus tenderness.  Eyes: Conjunctivae and EOM are normal. Pupils are equal, round, and reactive to light.  Neck: Normal range of motion. Neck supple.  Cardiovascular: Normal rate, regular rhythm, normal heart sounds and intact distal pulses.  Pulmonary/Chest: Effort normal and breath sounds normal.  Neurological: She is alert and oriented to person, place, and time. No cranial nerve deficit. Coordination normal.  Skin: Skin is warm and dry.  Vitals reviewed.   Lab Results  Component Value Date   WBC 6.4 07/28/2016   HGB 14.4 07/28/2016   HCT 43.9 07/28/2016   PLT 305 07/28/2016   GLUCOSE 80 07/28/2016   CHOL 225 (H) 07/28/2016   TRIG 146 07/28/2016   HDL 55 07/28/2016   LDLCALC 141 (H) 07/28/2016   ALT 14 08/12/2016   AST 19 08/12/2016   NA 138 07/28/2016   K 4.3 07/28/2016   CL 101 07/28/2016   CREATININE 0.86 07/28/2016   BUN 10 07/28/2016   CO2 31 07/28/2016   TSH 0.62 07/10/2016   HGBA1C 5.6 07/28/2016    Dg Lumbar Spine 2-3 Views  Result Date: 03/19/2017 CLINICAL DATA:  Midline low back pain chronic EXAM: LUMBAR SPINE - 2-3 VIEW COMPARISON:  01/23/2012 FINDINGS: Mild levoscoliosis at L4. Normal sagittal alignment. Negative for disc degeneration or spurring. No fracture or pars defect. Small calcifications overlying the left lower pole compatible with renal calculi which have progressed since the prior study. IMPRESSION: Mild lumbar levoscoliosis.  No significant degenerative change. Left lower pole renal calculi. Electronically Signed   By: Franchot Gallo M.D.   On: 03/19/2017 16:11    Assessment & Plan:   Abigail [name] was seen today for headache and medication refill.  Diagnoses and all orders for this visit:  Essential hypertension -     Basic metabolic panel -     amLODipine  (NORVASC) 5 MG tablet; Take 1 tablet (5 mg total) by mouth daily.  Acute intractable headache, unspecified headache type -     ketorolac (TORADOL) 30 MG/ML injection 30 mg -     promethazine (PHENERGAN) 25 MG tablet; Take 1 tablet (25 mg total) by mouth every 8 (eight) hours as needed for nausea.   I have discontinued Notnamed Garlock's sertraline, metroNIDAZOLE, phenazopyridine, and ciprofloxacin. I am also having her start on promethazine and amLODipine. Additionally, I am having her maintain her fexofenadine, cetirizine, saccharomyces boulardii, fluticasone, Vitamin D (Ergocalciferol), triamterene-hydrochlorothiazide, omeprazole, meloxicam, methocarbamol, and linaclotide. We administered ketorolac.  Meds ordered this encounter  Medications  . ketorolac (TORADOL) 30 MG/ML injection 30 mg  . promethazine (PHENERGAN) 25 MG tablet    Sig: Take 1 tablet (25 mg total) by mouth every 8 (eight) hours as needed for nausea.    Dispense:  20 tablet    Refill:  0    Order Specific Question:   Supervising Provider    Answer:   Lucille Passy [[id number]]  . amLODipine (NORVASC) 5 MG tablet    Sig: Take 1 tablet (5 mg total) by mouth daily.    Dispense:  30 tablet    Refill:  0    Order Specific Question:   Supervising Provider    Answer:   Lucille Passy [[id number]]    Follow-up: Return in about 2 days (around 06/25/2017) for HTN.  Wilfred Lacy, NP

## 2017-06-23 NOTE — Patient Instructions (Addendum)
Return to office on Friday as discussed.  Go to ED if symptoms worsen  General Headache Without Cause A headache is pain or discomfort felt around the head or neck area. There are many causes and types of headaches. In some cases, the cause may not be found. Follow these instructions at home: Managing pain  Take over-the-counter and prescription medicines only as told by your doctor.  Lie down in a dark, quiet room when you have a headache.  If directed, apply ice to the head and neck area: ? Put ice in a plastic bag. ? Place a towel between your skin and the bag. ? Leave the ice on for 20 minutes, 2-3 times per day.  Use a heating pad or hot shower to apply heat to the head and neck area as told by your doctor.  Keep lights dim if bright lights bother you or make your headaches worse. Eating and drinking  Eat meals on a regular schedule.  Lessen how much alcohol you drink.  Lessen how much caffeine you drink, or stop drinking caffeine. General instructions  Keep all follow-up visits as told by your doctor. This is important.  Keep a journal to find out if certain things bring on headaches. For example, write down: ? What you eat and drink. ? How much sleep you get. ? Any change to your diet or medicines.  Relax by getting a massage or doing other relaxing activities.  Lessen stress.  Sit up straight. Do not tighten (tense) your muscles.  Do not use tobacco products. This includes cigarettes, chewing tobacco, or e-cigarettes. If you need help quitting, ask your doctor.  Exercise regularly as told by your doctor.  Get enough sleep. This often means 7-9 hours of sleep. Contact a doctor if:  Your symptoms are not helped by medicine.  You have a headache that feels different than the other headaches.  You feel sick to your stomach (nauseous) or you throw up (vomit).  You have a fever. Get help right away if:  Your headache becomes really bad.  You keep  throwing up.  You have a stiff neck.  You have trouble seeing.  You have trouble speaking.  You have pain in the eye or ear.  Your muscles are weak or you lose muscle control.  You lose your balance or have trouble walking.  You feel like you will pass out (faint) or you pass out.  You have confusion. This information is not intended to replace advice given to you by your health care provider. Make sure you discuss any questions you have with your health care provider. Document Released: 02/04/2008 Document Revised: 10/03/2015 Document Reviewed: 08/20/2014 Elsevier Interactive Patient Education  [employer].

## 2017-06-25 ENCOUNTER — Ambulatory Visit: Payer: BLUE CROSS/BLUE SHIELD | Admitting: Nurse Practitioner

## 2017-06-25 ENCOUNTER — Encounter: Payer: Self-pay | Admitting: Nurse Practitioner

## 2017-06-25 VITALS — BP 110/80 | HR 84 | Temp 98.3°F | Ht 66.0 in | Wt 203.0 lb

## 2017-06-25 DIAGNOSIS — I10 Essential (primary) hypertension: Secondary | ICD-10-CM | POA: Diagnosis not present

## 2017-06-25 DIAGNOSIS — R008 Other abnormalities of heart beat: Secondary | ICD-10-CM | POA: Diagnosis not present

## 2017-06-25 DIAGNOSIS — R012 Other cardiac sounds: Secondary | ICD-10-CM

## 2017-06-25 DIAGNOSIS — R51 Headache: Secondary | ICD-10-CM

## 2017-06-25 DIAGNOSIS — R519 Headache, unspecified: Secondary | ICD-10-CM | POA: Insufficient documentation

## 2017-06-25 LAB — BASIC METABOLIC PANEL
BUN: 13 mg/dL (ref 6–23)
CALCIUM: 9.3 mg/dL (ref 8.4–10.5)
CO2: 31 mEq/L (ref 19–32)
CREATININE: 0.89 mg/dL (ref 0.40–1.20)
Chloride: 102 mEq/L (ref 96–112)
GFR: 87.16 mL/min (ref >60.00)
GLUCOSE: 97 mg/dL (ref 70–99)
Potassium: 5 mEq/L (ref 3.5–5.1)
SODIUM: 138 meq/L (ref 135–145)

## 2017-06-25 MED ORDER — AMLODIPINE BESYLATE 5 MG PO TABS: 5.0000 mg | ORAL_TABLET | Freq: Every day | ORAL | 1 refills | Status: DC

## 2017-06-25 NOTE — Patient Instructions (Addendum)
Go to lab for blood draw. Will send headache medication after review of BMP results.  Normal ECG. Abnormal heart sound is due to uncontrolled BP. Will continue to monitor.  Maintain current BP medications.  Maintain DASH diet and regular exercise.  Check BP 3x/week and record. Bring BP reading to next appt. Return to office sooner if BP >140/80.  Avoid any decongestant OTC. For congestion you may only use coricidin, mucinex or mucinex DM, zyrtec, loratadine, allegra, or flonase.  DASH Eating Plan DASH stands for "Dietary Approaches to Stop Hypertension." The DASH eating plan is a healthy eating plan that has been shown to reduce high blood pressure (hypertension). It may also reduce your risk for type 2 diabetes, heart disease, and stroke. The DASH eating plan may also help with weight loss. What are tips for following this plan? General guidelines  Avoid eating more than 2,300 mg (milligrams) of salt (sodium) a day. If you have hypertension, you may need to reduce your sodium intake to 1,500 mg a day.  Limit alcohol intake to no more than 1 drink a day for nonpregnant women and 2 drinks a day for men. One drink equals 12 oz of beer, 5 oz of wine, or 1 oz of hard liquor.  Work with your health care provider to maintain a healthy body weight or to lose weight. Ask what an ideal weight is for you.  Get at least 30 minutes of exercise that causes your heart to beat faster (aerobic exercise) most days of the week. Activities may include walking, swimming, or biking.  Work with your health care provider or diet and nutrition specialist (dietitian) to adjust your eating plan to your individual calorie needs. Reading food labels  Check food labels for the amount of sodium per serving. Choose foods with less than 5 percent of the Daily Value of sodium. Generally, foods with less than 300 mg of sodium per serving fit into this eating plan.  To find whole grains, look for the word "whole"  as the first word in the ingredient list. Shopping  Buy products labeled as "low-sodium" or "no salt added."  Buy fresh foods. Avoid canned foods and premade or frozen meals. Cooking  Avoid adding salt when cooking. Use salt-free seasonings or herbs instead of table salt or sea salt. Check with your health care provider or pharmacist before using salt substitutes.  Do not fry foods. Cook foods using healthy methods such as baking, boiling, grilling, and broiling instead.  Cook with heart-healthy oils, such as olive, canola, soybean, or sunflower oil. Meal planning   Eat a balanced diet that includes: ? 5 or more servings of fruits and vegetables each day. At each meal, try to fill half of your plate with fruits and vegetables. ? Up to 6-8 servings of whole grains each day. ? Less than 6 oz of lean meat, poultry, or fish each day. A 3-oz serving of meat is about the same size as a deck of cards. One egg equals 1 oz. ? 2 servings of low-fat dairy each day. ? A serving of nuts, seeds, or beans 5 times each week. ? Heart-healthy fats. Healthy fats called Omega-3 fatty acids are found in foods such as flaxseeds and coldwater fish, like sardines, salmon, and mackerel.  Limit how much you eat of the following: ? Canned or prepackaged foods. ? Food that is high in trans fat, such as fried foods. ? Food that is high in saturated fat, such as fatty meat. ?  Sweets, desserts, sugary drinks, and other foods with added sugar. ? Full-fat dairy products.  Do not salt foods before eating.  Try to eat at least 2 vegetarian meals each week.  Eat more home-cooked food and less restaurant, buffet, and fast food.  When eating at a restaurant, ask that your food be prepared with less salt or no salt, if possible. What foods are recommended? The items listed may not be a complete list. Talk with your dietitian about what dietary choices are best for you. Grains Whole-grain or whole-wheat bread.  Whole-grain or whole-wheat pasta. Brown rice. [place]. Bulgur. Whole-grain and low-sodium cereals. Pita bread. Low-fat, low-sodium crackers. Whole-wheat flour tortillas. Vegetables Fresh or frozen vegetables (raw, steamed, roasted, or grilled). Low-sodium or reduced-sodium tomato and vegetable juice. Low-sodium or reduced-sodium tomato sauce and tomato paste. Low-sodium or reduced-sodium canned vegetables. Fruits All fresh, dried, or frozen fruit. Canned fruit in natural juice (without added sugar). Meat and other protein foods Skinless chicken or [place]. Ground chicken or [place]. Pork with fat trimmed off. Fish and seafood. Egg whites. Dried beans, peas, or lentils. Unsalted nuts, nut butters, and seeds. Unsalted canned beans. Lean cuts of beef with fat trimmed off. Low-sodium, lean deli meat. Dairy Low-fat (1%) or fat-free (skim) milk. Fat-free, low-fat, or reduced-fat cheeses. Nonfat, low-sodium ricotta or cottage cheese. Low-fat or nonfat yogurt. Low-fat, low-sodium cheese. Fats and oils Soft margarine without trans fats. Vegetable oil. Low-fat, reduced-fat, or light mayonnaise and salad dressings (reduced-sodium). Canola, safflower, olive, soybean, and sunflower oils. Avocado. Seasoning and other foods Herbs. Spices. Seasoning mixes without salt. Unsalted popcorn and pretzels. Fat-free sweets. What foods are not recommended? The items listed may not be a complete list. Talk with your dietitian about what dietary choices are best for you. Grains Baked goods made with fat, such as croissants, muffins, or some breads. Dry pasta or rice meal packs. Vegetables Creamed or fried vegetables. Vegetables in a cheese sauce. Regular canned vegetables (not low-sodium or reduced-sodium). Regular canned tomato sauce and paste (not low-sodium or reduced-sodium). Regular tomato and vegetable juice (not low-sodium or reduced-sodium). Angie Fava. Olives. Fruits Canned fruit in a light or heavy syrup.  Fried fruit. Fruit in cream or butter sauce. Meat and other protein foods Fatty cuts of meat. Ribs. Fried meat. Berniece Salines. Sausage. Bologna and other processed lunch meats. Salami. Fatback. Hotdogs. Bratwurst. Salted nuts and seeds. Canned beans with added salt. Canned or smoked fish. Whole eggs or egg yolks. Chicken or [place] with skin. Dairy Whole or 2% milk, cream, and half-and-half. Whole or full-fat cream cheese. Whole-fat or sweetened yogurt. Full-fat cheese. Nondairy creamers. Whipped toppings. Processed cheese and cheese spreads. Fats and oils Butter. Stick margarine. Lard. Shortening. Ghee. Bacon fat. Tropical oils, such as coconut, palm kernel, or palm oil. Seasoning and other foods Salted popcorn and pretzels. Onion salt, garlic salt, seasoned salt, table salt, and sea salt. Worcestershire sauce. Tartar sauce. Barbecue sauce. Teriyaki sauce. Soy sauce, including reduced-sodium. Steak sauce. Canned and packaged gravies. Fish sauce. Oyster sauce. Cocktail sauce. Horseradish that you find on the shelf. Ketchup. Mustard. Meat flavorings and tenderizers. Bouillon cubes. Hot sauce and Tabasco sauce. Premade or packaged marinades. Premade or packaged taco seasonings. Relishes. Regular salad dressings. Where to find more information:  National Heart, Lung, and [place]: https://wilson-eaton.com/  American Heart Association: www.heart.org Summary  The DASH eating plan is a healthy eating plan that has been shown to reduce high blood pressure (hypertension). It may also reduce your risk for type 2 diabetes,  heart disease, and stroke.  With the DASH eating plan, you should limit salt (sodium) intake to 2,300 mg a day. If you have hypertension, you may need to reduce your sodium intake to 1,500 mg a day.  When on the DASH eating plan, aim to eat more fresh fruits and vegetables, whole grains, lean proteins, low-fat dairy, and heart-healthy fats.  Work with your health care provider or diet and  nutrition specialist (dietitian) to adjust your eating plan to your individual calorie needs. This information is not intended to replace advice given to you by your health care provider. Make sure you discuss any questions you have with your health care provider. Document Released: 04/16/2011 Document Revised: 04/20/2016 Document Reviewed: 04/20/2016 Elsevier Interactive Patient Education  [employer].

## 2017-06-25 NOTE — Progress Notes (Signed)
Pt doing lab work from 06/23/17.

## 2017-06-25 NOTE — Progress Notes (Signed)
Subjective:  Patient ID: Abigail [name], female    DOB: 1971/04/03  Age: 47 y.o. MRN: [id number]  CC: Follow-up (BP follow up)  HPI Headache: Resolved with toradol IM and promethazine.   HTN: Improved with amlodipine. Denies any adverse effects. BP Readings from Last 3 Encounters:  06/25/17 110/80  06/23/17 (!) 146/92  05/07/17 130/90   Outpatient Medications Prior to Visit  Medication Sig Dispense Refill  . cetirizine (ZYRTEC) 10 MG tablet Take 10 mg by mouth daily.    . fluticasone (FLONASE) 50 MCG/ACT nasal spray Place 1 spray into both nostrils daily. 16 g 5  . linaclotide (LINZESS) 145 MCG CAPS capsule Take 1 capsule (145 mcg total) by mouth daily before breakfast. 90 capsule 1  . meloxicam (MOBIC) 7.5 MG tablet Take 1 tablet (7.5 mg total) daily by mouth. With food 20 tablet 0  . methocarbamol (ROBAXIN) 500 MG tablet Take 1 tablet (500 mg total) every 8 (eight) hours as needed by mouth for muscle spasms. 21 tablet 0  . omeprazole (PRILOSEC) 40 MG capsule Take 1 capsule (40 mg total) daily by mouth. 30 capsule 5  . promethazine (PHENERGAN) 25 MG tablet Take 1 tablet (25 mg total) by mouth every 8 (eight) hours as needed for nausea. 20 tablet 0  . saccharomyces boulardii (FLORASTOR) 250 MG capsule Take 250 mg by mouth 2 (two) times daily.    Marland Kitchen triamterene-hydrochlorothiazide (MAXZIDE-25) 37.5-25 MG tablet Take 1 tablet by mouth daily. See PCP for more refills. 30 tablet 0  . Vitamin D, Ergocalciferol, (DRISDOL) 50000 units CAPS capsule TAKE 1 CAPSULE BY MOUTH EVERY 7 DAYS 30 capsule 1  . amLODipine (NORVASC) 5 MG tablet Take 1 tablet (5 mg total) by mouth daily. 30 tablet 0  . fexofenadine (ALLEGRA) 180 MG tablet Take 1 tablet (180 mg total) by mouth daily. 90 tablet 1   No facility-administered medications prior to visit.     ROS See HPI  Objective:  BP 110/80   Pulse 84   Temp 98.3 F (36.8 C)   Ht 5\' 6"  (1.676 m) Comment: hight was enter in error before  Wt 203 lb  (92.1 kg)   LMP 01/18/2012 (Exact Date)   SpO2 96%   BMI 32.77 kg/m   BP Readings from Last 3 Encounters:  06/25/17 110/80  06/23/17 (!) 146/92  05/07/17 130/90    Wt Readings from Last 3 Encounters:  06/25/17 203 lb (92.1 kg)  06/23/17 208 lb (94.3 kg)  05/07/17 205 lb (93 kg)    Physical Exam  Constitutional: She is oriented to person, place, and time. No distress.  Neck: Normal range of motion. Neck supple. No JVD present.  Cardiovascular: Normal rate, regular rhythm and intact distal pulses. Exam reveals gallop.  Pulmonary/Chest: Effort normal and breath sounds normal.  Musculoskeletal: She exhibits no edema.  Neurological: She is alert and oriented to person, place, and time.  Skin: Skin is warm and dry.  Psychiatric: She has a normal mood and affect. Her behavior is normal. Thought content normal.  Vitals reviewed.   Lab Results  Component Value Date   WBC 6.4 07/28/2016   HGB 14.4 07/28/2016   HCT 43.9 07/28/2016   PLT 305 07/28/2016   GLUCOSE 80 07/28/2016   CHOL 225 (H) 07/28/2016   TRIG 146 07/28/2016   HDL 55 07/28/2016   LDLCALC 141 (H) 07/28/2016   ALT 14 08/12/2016   AST 19 08/12/2016   NA 138 07/28/2016   K 4.3 07/28/2016  CL 101 07/28/2016   CREATININE 0.86 07/28/2016   BUN 10 07/28/2016   CO2 31 07/28/2016   TSH 0.62 07/10/2016   HGBA1C 5.6 07/28/2016    Dg Lumbar Spine 2-3 Views  Result Date: 03/19/2017 CLINICAL DATA:  Midline low back pain chronic EXAM: LUMBAR SPINE - 2-3 VIEW COMPARISON:  01/23/2012 FINDINGS: Mild levoscoliosis at L4. Normal sagittal alignment. Negative for disc degeneration or spurring. No fracture or pars defect. Small calcifications overlying the left lower pole compatible with renal calculi which have progressed since the prior study. IMPRESSION: Mild lumbar levoscoliosis.  No significant degenerative change. Left lower pole renal calculi. Electronically Signed   By: Franchot Gallo M.D.   On: 03/19/2017 16:11   ECG:  NSR, no indication of LVH and/or BBB, no change compared to previous ECG 2017.  Assessment & Plan:   Sarabelle was seen today for follow-up.  Diagnoses and all orders for this visit:  Essential hypertension -     EKG 12-Lead -     amLODipine (NORVASC) 5 MG tablet; Take 1 tablet (5 mg total) by mouth daily.  Abnormal heart sounds -     EKG 12-Lead  Gallop rhythm   I have discontinued Nita Boule's fexofenadine. I am also having her maintain her cetirizine, saccharomyces boulardii, fluticasone, Vitamin D (Ergocalciferol), triamterene-hydrochlorothiazide, omeprazole, meloxicam, methocarbamol, linaclotide, promethazine, and amLODipine.  Meds ordered this encounter  Medications  . amLODipine (NORVASC) 5 MG tablet    Sig: Take 1 tablet (5 mg total) by mouth daily.    Dispense:  90 tablet    Refill:  1    Order Specific Question:   Supervising Provider    Answer:   Lucille Passy [[id number]]    Follow-up: Return in about 4 weeks (around 07/23/2017) for CPE (fasting) and HTN f/up.  Wilfred Lacy, NP

## 2017-07-01 ENCOUNTER — Ambulatory Visit: Payer: BLUE CROSS/BLUE SHIELD | Admitting: Nurse Practitioner

## 2017-07-01 ENCOUNTER — Ambulatory Visit (INDEPENDENT_AMBULATORY_CARE_PROVIDER_SITE_OTHER): Payer: BLUE CROSS/BLUE SHIELD

## 2017-07-01 ENCOUNTER — Ambulatory Visit: Payer: Self-pay

## 2017-07-01 ENCOUNTER — Encounter: Payer: Self-pay | Admitting: Nurse Practitioner

## 2017-07-01 VITALS — BP 132/86 | HR 113 | Temp 98.9°F | Ht 66.0 in | Wt 202.0 lb

## 2017-07-01 DIAGNOSIS — R079 Chest pain, unspecified: Secondary | ICD-10-CM | POA: Diagnosis not present

## 2017-07-01 DIAGNOSIS — R0602 Shortness of breath: Secondary | ICD-10-CM | POA: Diagnosis not present

## 2017-07-01 LAB — D-DIMER, QUANTITATIVE: D-Dimer, Quant: 0.39 mcg/mL FEU (ref <0.50)

## 2017-07-01 NOTE — Progress Notes (Signed)
Subjective:  Patient ID: Abigail [name], female    DOB: 1971/01/16  Age: 47 y.o. MRN: [id number]  CC: Fatigue (weakness,SOB on excertion,fast HR, started amlodipine last week. )  Chest Pain   This is a recurrent problem. The current episode started 1 to 4 weeks ago. The onset quality is gradual. The problem occurs intermittently. The problem has been waxing and waning. The pain is present in the lateral region (left side). The pain is mild. The quality of the pain is described as tightness. The pain does not radiate. Associated symptoms include a cough, malaise/fatigue, palpitations and shortness of breath. Pertinent negatives include no back pain, exertional chest pressure, irregular heartbeat, leg pain, nausea, orthopnea, PND, sputum production, syncope, vomiting or weakness. The cough has no precipitants. Nothing worsens the cough. She has tried rest for the symptoms. The treatment provided no relief. Risk factors include obesity, lack of exercise and sedentary lifestyle.  Her past medical history is significant for hypertension.  Her family medical history is significant for diabetes, heart disease, hyperlipidemia and hypertension. Prior diagnostic workup includes chest x-ray.    Outpatient Medications Prior to Visit  Medication Sig Dispense Refill  . amLODipine (NORVASC) 5 MG tablet Take 1 tablet (5 mg total) by mouth daily. 90 tablet 1  . cetirizine (ZYRTEC) 10 MG tablet Take 10 mg by mouth daily.    . fluticasone (FLONASE) 50 MCG/ACT nasal spray Place 1 spray into both nostrils daily. 16 g 5  . meloxicam (MOBIC) 7.5 MG tablet Take 1 tablet (7.5 mg total) daily by mouth. With food 20 tablet 0  . methocarbamol (ROBAXIN) 500 MG tablet Take 1 tablet (500 mg total) every 8 (eight) hours as needed by mouth for muscle spasms. 21 tablet 0  . omeprazole (PRILOSEC) 40 MG capsule Take 1 capsule (40 mg total) daily by mouth. 30 capsule 5  . promethazine (PHENERGAN) 25 MG tablet Take 1 tablet (25 mg  total) by mouth every 8 (eight) hours as needed for nausea. 20 tablet 0  . saccharomyces boulardii (FLORASTOR) 250 MG capsule Take 250 mg by mouth 2 (two) times daily.    Marland Kitchen triamterene-hydrochlorothiazide (MAXZIDE-25) 37.5-25 MG tablet Take 1 tablet by mouth daily. See PCP for more refills. 30 tablet 0  . Vitamin D, Ergocalciferol, (DRISDOL) 50000 units CAPS capsule TAKE 1 CAPSULE BY MOUTH EVERY 7 DAYS 30 capsule 1  . linaclotide (LINZESS) 145 MCG CAPS capsule Take 1 capsule (145 mcg total) by mouth daily before breakfast. (Patient not taking: Reported on 07/01/2017) 90 capsule 1   No facility-administered medications prior to visit.     ROS See HPI  Objective:  BP 132/86   Pulse (!) 113   Temp 98.9 F (37.2 C)   Ht 5\' 6"  (1.676 m)   Wt 202 lb (91.6 kg)   LMP 01/18/2012 (Exact Date)   SpO2 97%   BMI 32.60 kg/m   BP Readings from Last 3 Encounters:  07/01/17 132/86  06/25/17 110/80  06/23/17 (!) 146/92    Wt Readings from Last 3 Encounters:  07/01/17 202 lb (91.6 kg)  06/25/17 203 lb (92.1 kg)  06/23/17 208 lb (94.3 kg)    Physical Exam  Constitutional: She is oriented to person, place, and time. No distress.  Cardiovascular: Normal rate and regular rhythm. Exam reveals gallop.  No murmur heard. Pulmonary/Chest: Effort normal and breath sounds normal.  Musculoskeletal: She exhibits no edema.  Neurological: She is alert and oriented to person, place, and time.  Psychiatric: She  has a normal mood and affect. Her behavior is normal.  Vitals reviewed.   Lab Results  Component Value Date   WBC 6.4 07/28/2016   HGB 14.4 07/28/2016   HCT 43.9 07/28/2016   PLT 305 07/28/2016   GLUCOSE 97 06/25/2017   CHOL 225 (H) 07/28/2016   TRIG 146 07/28/2016   HDL 55 07/28/2016   LDLCALC 141 (H) 07/28/2016   ALT 14 08/12/2016   AST 19 08/12/2016   NA 138 06/25/2017   K 5.0 06/25/2017   CL 102 06/25/2017   CREATININE 0.89 06/25/2017   BUN 13 06/25/2017   CO2 31 06/25/2017    TSH 0.62 07/10/2016   HGBA1C 5.6 07/28/2016    Dg Lumbar Spine 2-3 Views  Result Date: 03/19/2017 CLINICAL DATA:  Midline low back pain chronic EXAM: LUMBAR SPINE - 2-3 VIEW COMPARISON:  01/23/2012 FINDINGS: Mild levoscoliosis at L4. Normal sagittal alignment. Negative for disc degeneration or spurring. No fracture or pars defect. Small calcifications overlying the left lower pole compatible with renal calculi which have progressed since the prior study. IMPRESSION: Mild lumbar levoscoliosis.  No significant degenerative change. Left lower pole renal calculi. Electronically Signed   By: Franchot Gallo M.D.   On: 03/19/2017 16:11   ECG: S. Tachycardia, no change in T wave or ST interval compared to previous ECG done 06/25/2017.  Assessment & Plan:   Abigail [name] was seen today for fatigue.  Diagnoses and all orders for this visit:  Chest pain, unspecified type -     EKG 12-Lead -     DG Chest 2 View; Future -     D-Dimer, Quantitative -     DG Chest 2 View  SOB (shortness of breath) -     EKG 12-Lead -     DG Chest 2 View; Future -     D-Dimer, Quantitative -     DG Chest 2 View   I am having Abigail [name] maintain her cetirizine, saccharomyces boulardii, fluticasone, Vitamin D (Ergocalciferol), triamterene-hydrochlorothiazide, omeprazole, meloxicam, methocarbamol, linaclotide, promethazine, and amLODipine.  No orders of the defined types were placed in this encounter.   Follow-up: No Follow-up on file.  Wilfred Lacy, NP

## 2017-07-01 NOTE — Patient Instructions (Addendum)
Normal CXR. Negative Ddimer. Entered referral to cardiology  Chest Wall Pain Chest wall pain is pain in or around the bones and muscles of your chest. Sometimes, an injury causes this pain. Sometimes, the cause may not be known. This pain may take several weeks or longer to get better. Follow these instructions at home: Pay attention to any changes in your symptoms. Take these actions to help with your pain:  Rest as told by your doctor.  Avoid activities that cause pain. Try not to use your chest, belly (abdominal), or side muscles to lift heavy things.  If directed, apply ice to the painful area: ? Put ice in a plastic bag. ? Place a towel between your skin and the bag. ? Leave the ice on for 20 minutes, 2-3 times per day.  Take over-the-counter and prescription medicines only as told by your doctor.  Do not use tobacco products, including cigarettes, chewing tobacco, and e-cigarettes. If you need help quitting, ask your doctor.  Keep all follow-up visits as told by your doctor. This is important.  Contact a doctor if:  You have a fever.  Your chest pain gets worse.  You have new symptoms. Get help right away if:  You feel sick to your stomach (nauseous) or you throw up (vomit).  You feel sweaty or light-headed.  You have a cough with phlegm (sputum) or you cough up blood.  You are short of breath. This information is not intended to replace advice given to you by your health care provider. Make sure you discuss any questions you have with your health care provider. Document Released: 10/14/2007 Document Revised: 10/03/2015 Document Reviewed: 07/23/2014 Elsevier Interactive Patient Education  [employer].

## 2017-07-01 NOTE — Telephone Encounter (Signed)
FYI

## 2017-07-01 NOTE — Telephone Encounter (Signed)
Patient called in with c/o "feeling tired." She says "I saw the doctor last week and I was feeling kind of like this. She started me on amlodipine for my BP. I rested this past weekend thinking that would help and went to work on Monday. Since then, the feelings of tiredness have come back. I feel like I've been working out. My heart rate is racing at 100-125 and I just feel like I am worn out. I feel a little SOB, but my oxygen level is 94%." I asked did she notice feeling any different after taking the medication, she said "no, I can't say the medicine is causing the symptoms." She denies any other symptoms-nausea, CP, weakness/numbness to extremities, cough, fever. According to protocol, see PCP within 24 hours, appointment made for today, care advice given, she verbalized understanding.   Reason for Disposition . Taking a medicine that could cause weakness (e.g., blood pressure medications, diuretics)  Answer Assessment - Initial Assessment Questions 1. DESCRIPTION: "Describe how you are feeling."     It feels like I've been working out, feeling tired 2. SEVERITY: "How bad is it?"  "Can you stand and walk?"   - MILD - Feels weak or tired, but does not interfere with work, school or normal activities   - [place] to stand and walk; weakness interferes with work, school, or normal activities   - SEVERE - Unable to stand or walk     Mild 3. ONSET:  "When did the weakness begin?"     All week 4. CAUSE: "What do you think is causing the weakness?"     I don't know 5. MEDICINES: "Have you recently started a new medicine or had a change in the amount of a medicine?"     Amlodipine 5 mg daily 6. OTHER SYMPTOMS: "Do you have any other symptoms?" (e.g., chest pain, fever, cough, SOB, vomiting, diarrhea, bleeding)     Cough last night and a little today, SOB when walking, heart racing-100's 7. PREGNANCY: "Is there any chance you are pregnant?" "When was your last menstrual period?"      No  Protocols used: WEAKNESS (GENERALIZED) AND FATIGUE-A-AH

## 2017-07-02 ENCOUNTER — Encounter: Payer: Self-pay | Admitting: Nurse Practitioner

## 2017-07-06 ENCOUNTER — Encounter: Payer: Self-pay | Admitting: Nurse Practitioner

## 2017-07-06 ENCOUNTER — Ambulatory Visit: Payer: BLUE CROSS/BLUE SHIELD | Admitting: Nurse Practitioner

## 2017-07-06 VITALS — BP 130/80 | HR 92 | Ht 66.0 in | Wt 201.0 lb

## 2017-07-06 DIAGNOSIS — R002 Palpitations: Secondary | ICD-10-CM | POA: Diagnosis not present

## 2017-07-06 DIAGNOSIS — R079 Chest pain, unspecified: Secondary | ICD-10-CM | POA: Diagnosis not present

## 2017-07-06 DIAGNOSIS — I10 Essential (primary) hypertension: Secondary | ICD-10-CM | POA: Diagnosis not present

## 2017-07-06 MED ORDER — METOPROLOL TARTRATE 50 MG PO TABS: 50.0000 mg | ORAL_TABLET | ORAL | 0 refills | Status: DC

## 2017-07-06 NOTE — Patient Instructions (Addendum)
We will be checking the following labs today - BMET, CBC and TSH   Medication Instructions:    Continue with your current medicines for now.   But we will have you take a dose of Lopressor 50 mg the night prior to your scan and repeat one hour prior to your CT scan.     Testing/Procedures To Be Arranged:  Cardiac CT scan with FFR  Alive Core heart monitor app - on [employer]   Follow-Up:   Will see how your tests turn out and then decide about follow up.     Other Special Instructions:   Please arrive at the [facility] main entrance of [facility] at __________ AM (30-45 minutes prior to test start time)  [facility] [address] [place], [place] [address] [phone]  Proceed to the [facility] Radiology Department (First Floor).  Please follow these instructions carefully (unless otherwise directed):    On the Night Before the Test: Drink plenty of water. Do not consume any caffeinated/decaffeinated beverages or chocolate 12 hours prior to your test. Do not take any antihistamines 12 hours prior to your test.   On the Day of the Test: Drink plenty of water. Do not drink any water within one hour of the test. Do not eat any food 4 hours prior to the test. You may take your regular medications prior to the test.  After the Test: Drink plenty of water. After receiving IV contrast, you may experience a mild flushed feeling. This is normal. On occasion, you may experience a mild rash up to 24 hours after the test. This is not dangerous. If this occurs, you can take Benadryl 25 mg and increase your fluid intake. If you experience trouble breathing, this can be serious. If it is severe call 911 IMMEDIATELY. If it is mild, please call our office. If you take any of these medications: Glipizide/Metformin, Avandament, Glucavance, please do not take 48 hours after completing test.      If you need a refill on your cardiac medications before  your next appointment, please call your pharmacy.   Call the [place] office at [phone] if you have any questions, problems or concerns.

## 2017-07-06 NOTE — Progress Notes (Signed)
CARDIOLOGY OFFICE NOTE  Date:  07/06/2017    Abigail [name] Date of Birth: Nov 03, 1970 Medical Record #[id number]  PCP:  Flossie Buffy, NP  Cardiologist:  Marisa Cyphers (NEW/DOD)    Chief Complaint  Patient presents with  . Chest Pain    New patient visit - seen for Dr. Marlou Porch (DOD)    History of Present Illness: Abigail [name] is a 47 y.o. female who presents today for a new patient visit. Seen for Dr. Marlou Porch (DOD).   No prior cardiac history.   Has a history of anemia, anxiety, depression, GERD, HA, and HTN.   Comes in today. Here alone. Referred by her PCP for chest pain. Has had about a 4 week history of chest pain/palpitations. She feels like something is wrong. She is quite fatigue - seems very out of proportion for her. She notes this heaviness in her chest - mostly on the left side. Occurs at rest. But also has with exertion. She tried to go to the gym one day last week and had a spell - got scared - stopped and did not go back. Gets short of breath with stairs and walking. Her throat feels full at times. She has palpitations - sensation that her heart is beating hard and fast. She does not really use that much caffeine. She wonders how much stress is playing a part but not really have that much significant stress in both of our opinions. FH negative for early CAD. Mom died at 4 with pulmonary fibrosis. Her siblings have no heart disease but she says one sister was told that she "had something that could cause a stroke" but nothing definitive. Does have some snoring - but husband has never told her she stopped breathing. She feels refreshed with waking up. Feels a little better with Norvasc but still worried that something is wrong.   Past Medical History:  Diagnosis Date  . Anemia    iron supp  . Anxiety   . Depression   . Fibroids   . GERD (gastroesophageal reflux disease)    omeprazole  . Headache(784.0)   . Hypertension 2012   Maxide  . S/P LEEP 1992    . Urticaria     Past Surgical History:  Procedure Laterality Date  . LEEP  1998   Abnormal pap  . SUPRACERVICAL ABDOMINAL HYSTERECTOMY  01/18/2012   Procedure: HYSTERECTOMY SUPRACERVICAL ABDOMINAL with LSO;  Surgeon: Osborne Oman, MD;  Location: [facility];  Service: Gynecology;  Laterality: N/A;  . TUBAL LIGATION  1997  . WISDOM TOOTH EXTRACTION       Medications: Current Meds  Medication Sig  . amLODipine (NORVASC) 5 MG tablet Take 1 tablet (5 mg total) by mouth daily.  . cetirizine (ZYRTEC) 10 MG tablet Take 10 mg by mouth daily.  . fluticasone (FLONASE) 50 MCG/ACT nasal spray Place 1 spray into both nostrils daily.  . meloxicam (MOBIC) 7.5 MG tablet Take 1 tablet (7.5 mg total) daily by mouth. With food  . methocarbamol (ROBAXIN) 500 MG tablet Take 1 tablet (500 mg total) every 8 (eight) hours as needed by mouth for muscle spasms.  Marland Kitchen omeprazole (PRILOSEC) 40 MG capsule Take 1 capsule (40 mg total) daily by mouth.  . promethazine (PHENERGAN) 25 MG tablet Take 1 tablet (25 mg total) by mouth every 8 (eight) hours as needed for nausea.  Marland Kitchen saccharomyces boulardii (FLORASTOR) 250 MG capsule Take 250 mg by mouth 2 (two) times daily.  Marland Kitchen triamterene-hydrochlorothiazide (  MAXZIDE-25) 37.5-25 MG tablet Take 1 tablet by mouth daily. See PCP for more refills.  . Vitamin D, Ergocalciferol, (DRISDOL) 50000 units CAPS capsule TAKE 1 CAPSULE BY MOUTH EVERY 7 DAYS     Allergies: Allergies  Allergen Reactions  . Shellfish Allergy Itching and Swelling    Swollen tongue, itching  . Tylox [Oxycodone-Acetaminophen] Itching    Social History: The patient  reports that  has never smoked. she has never used smokeless tobacco. She reports that she drinks about 0.5 oz of alcohol per week. She reports that she does not use drugs.   Family History: The patient's family history includes Alcohol abuse in her father; Arthritis in her mother; Cancer in her paternal grandmother; Colonic polyp in her  father; Depression in her father; Diabetes in her brother, father, and sister; Heart attack in her paternal grandmother; Hypertension in her father, mother, and sister; Pulmonary fibrosis in her mother.   Review of Systems: Please see the history of present illness.   Otherwise, the review of systems is positive for none.   All other systems are reviewed and negative.   Physical Exam: VS:  BP 130/80 (BP Location: Left Arm, Patient Position: Sitting, Cuff Size: Large)   Pulse 92   Ht 5\' 6"  (1.676 m)   Wt 201 lb (91.2 kg)   LMP 01/18/2012 (Exact Date)   BMI 32.44 kg/m  .  BMI Body mass index is 32.44 kg/m.  Wt Readings from Last 3 Encounters:  07/06/17 201 lb (91.2 kg)  07/01/17 202 lb (91.6 kg)  06/25/17 203 lb (92.1 kg)    General: Pleasant. Obese. Alert and in no acute distress.   HEENT: Normal.  Neck: Supple, no JVD, carotid bruits, or masses noted.  Cardiac: Regular rate and rhythm. No murmurs, rubs, or gallops. No edema.  Respiratory:  Lungs are clear to auscultation bilaterally with normal work of breathing.  GI: Soft and nontender.  MS: No deformity or atrophy. Gait and ROM intact.  Skin: Warm and dry. Color is normal.  Neuro:  Strength and sensation are intact and no gross focal deficits noted.  Psych: Alert, appropriate and with normal affect.   LABORATORY DATA:  EKG:  EKG is ordered today. This demonstrates NSR. Reviewed with Dr. Marlou Porch (DOD)  Lab Results  Component Value Date   WBC 6.4 07/28/2016   HGB 14.4 07/28/2016   HCT 43.9 07/28/2016   PLT 305 07/28/2016   GLUCOSE 97 06/25/2017   CHOL 225 (H) 07/28/2016   TRIG 146 07/28/2016   HDL 55 07/28/2016   LDLCALC 141 (H) 07/28/2016   ALT 14 08/12/2016   AST 19 08/12/2016   NA 138 06/25/2017   K 5.0 06/25/2017   CL 102 06/25/2017   CREATININE 0.89 06/25/2017   BUN 13 06/25/2017   CO2 31 06/25/2017   TSH 0.62 07/10/2016   HGBA1C 5.6 07/28/2016     BNP (last 3 results) No results for input(s): BNP in  the last 8760 hours.  ProBNP (last 3 results) No results for input(s): PROBNP in the last 8760 hours.   Other Studies Reviewed Today:   Assessment/Plan: 1. Chest pain  2. Palpitations  3. HTN  4. Untreated HLD  Symptoms are somewhat worrisome. She has chest pain with and without exertion. EKG is ok. Risk factors are obesity, HTN and HLD. No smoking history. FH negative but mom died at 67 with fibrosis. Discussed with Dr. Marlou Porch (DOD) here in the office - we have recommended cardiac CT. She  has an allergy to shellfish - per Dr. Meda Coffee who is also here in the office that premed is NOT needed. Will get baseline lab today. Have also asked her to obtain Alive Core heart app. Further disposition pending.   Current medicines are reviewed with the patient today.  The patient does not have concerns regarding medicines other than what has been noted above.  The following changes have been made:  See above.  Labs/ tests ordered today include:    Orders Placed This Encounter  Procedures  . CT CORONARY MORPH W/CTA COR W/SCORE W/CA W/CM &/OR WO/CM  . CT CORONARY FRACTIONAL FLOW RESERVE DATA PREP  . CT CORONARY FRACTIONAL FLOW RESERVE FLUID ANALYSIS  . Basic metabolic panel  . CBC  . TSH  . EKG 12-Lead     Disposition:   Further disposition pending.    Patient is agreeable to this plan and will call if any problems develop in the interim.   SignedTruitt Merle, NP  07/06/2017 3:08 PM  [facility] [address] [place], [place]  [address] Phone: [phone] Fax: [phone]

## 2017-07-07 LAB — CBC
Hematocrit: 41.5 % (ref 34.0–46.6)
Hemoglobin: 14 g/dL (ref 11.1–15.9)
MCH: 30.7 pg (ref 26.6–33.0)
MCHC: 33.7 g/dL (ref 31.5–35.7)
MCV: 91 fL (ref 79–97)
Platelets: 326 10*3/uL (ref 150–379)
RBC: 4.56 x10E6/uL (ref 3.77–5.28)
RDW: 13.3 % (ref 12.3–15.4)
WBC: 7 10*3/uL (ref 3.4–10.8)

## 2017-07-07 LAB — BASIC METABOLIC PANEL WITH GFR
BUN/Creatinine Ratio: 14 (ref 9–23)
BUN: 13 mg/dL (ref 6–24)
CO2: 22 mmol/L (ref 20–29)
Calcium: 9.6 mg/dL (ref 8.7–10.2)
Chloride: 98 mmol/L (ref 96–106)
Creatinine, Ser: 0.9 mg/dL (ref 0.57–1.00)
GFR calc Af Amer: 88 mL/min/{1.73_m2}
GFR calc non Af Amer: 76 mL/min/{1.73_m2}
Glucose: 92 mg/dL (ref 65–99)
Potassium: 4.1 mmol/L (ref 3.5–5.2)
Sodium: 136 mmol/L (ref 134–144)

## 2017-07-07 LAB — TSH: TSH: 0.491 u[IU]/mL (ref 0.450–4.500)

## 2017-07-11 ENCOUNTER — Other Ambulatory Visit: Payer: Self-pay | Admitting: Nurse Practitioner

## 2017-07-11 DIAGNOSIS — I10 Essential (primary) hypertension: Secondary | ICD-10-CM

## 2017-07-12 ENCOUNTER — Other Ambulatory Visit: Payer: Self-pay | Admitting: Nurse Practitioner

## 2017-07-12 DIAGNOSIS — I10 Essential (primary) hypertension: Secondary | ICD-10-CM

## 2017-07-15 ENCOUNTER — Encounter: Payer: Self-pay | Admitting: Nurse Practitioner

## 2017-07-15 ENCOUNTER — Telehealth: Payer: Self-pay | Admitting: Nurse Practitioner

## 2017-07-15 DIAGNOSIS — R51 Headache: Principal | ICD-10-CM

## 2017-07-15 DIAGNOSIS — R519 Headache, unspecified: Secondary | ICD-10-CM

## 2017-07-15 DIAGNOSIS — R002 Palpitations: Secondary | ICD-10-CM

## 2017-07-15 MED ORDER — NAPROXEN 500 MG PO TABS: 500.0000 mg | ORAL_TABLET | Freq: Two times a day (BID) | ORAL | 0 refills | Status: DC | PRN

## 2017-07-15 NOTE — Telephone Encounter (Signed)
Please arrange event monitor.   Thanks!

## 2017-07-15 NOTE — Telephone Encounter (Signed)
Pt was in last week, she was to be set up for a CT scan, but she is now having some issues she was to discuss with the nurse-pls advise

## 2017-07-15 NOTE — Telephone Encounter (Signed)
Called and made patient aware that we would order 30 day event monitor and that someone from [facility] pool would be contacting her to schedule the appointment. Patient verbalized understanding.

## 2017-07-15 NOTE — Telephone Encounter (Signed)
Returned call to patient who states that she has had 3 episodes in the last week where her heart was pounding accompanied by a pounding headache. She states during one episode she was just sitting watching TV and when she stood up to go to the other room is when her heart and head started pounding. She states that she began to panic, sat down and and her symptoms resolved in 10 minutes. She states that the other night she was woken up out of her sleep with heart and head pounding. She states that her episodes last for about 5-10 minutes and go away. She states that during these episodes she feels like her heart is going to beat out of her chest and she begins to panic and becomes SOB. She denies having any chest pain, lightheadedness, dizziness, or any other symptoms. She states that she has not had any caffeine this week. She denies using any stimulants or diet pills. She denies having any stress in her life. She denies having a history of anxiety or panic attacks. She states that she does not know what her HR is during these episodes. She states that her resting HR is 86-96 with BP running around 130/80. She takes amlodipine 5 mg QD. Patient states that a Cardiac CT was ordered at her visit on 2/26. Patient is asking how long it would be before it would be scheduled. Made patient aware that once the test was ordered it was sent to the pre-cert department to be pre-approved by her insurance and that sometimes this can take a couple of weeks. Patient was also advised to try and get the Alive Core heart app for her phone, but patient's phone is not compatible. Made patient aware that I would forward the information to Truitt Merle, NP for review and recommendation to see if she would like to order a monitor while she waits on her Cardiac CT to be scheduled. Advised patient to continue to avoid caffeine and to let us know if her symptoms worsen or change. Patient verbalized understanding and thanked me for the call,.

## 2017-07-16 ENCOUNTER — Other Ambulatory Visit: Payer: Self-pay | Admitting: *Deleted

## 2017-07-16 DIAGNOSIS — R002 Palpitations: Secondary | ICD-10-CM

## 2017-07-16 NOTE — Telephone Encounter (Signed)
S/w pt set up event monitor.

## 2017-07-26 ENCOUNTER — Encounter (INDEPENDENT_AMBULATORY_CARE_PROVIDER_SITE_OTHER): Payer: Self-pay

## 2017-07-27 ENCOUNTER — Encounter: Payer: Self-pay | Admitting: Nurse Practitioner

## 2017-07-27 ENCOUNTER — Ambulatory Visit (INDEPENDENT_AMBULATORY_CARE_PROVIDER_SITE_OTHER): Payer: BLUE CROSS/BLUE SHIELD | Admitting: Nurse Practitioner

## 2017-07-27 VITALS — BP 124/80 | HR 93 | Temp 98.0°F | Ht 66.0 in | Wt 202.0 lb

## 2017-07-27 DIAGNOSIS — Z1231 Encounter for screening mammogram for malignant neoplasm of breast: Secondary | ICD-10-CM

## 2017-07-27 DIAGNOSIS — I10 Essential (primary) hypertension: Secondary | ICD-10-CM | POA: Diagnosis not present

## 2017-07-27 DIAGNOSIS — K219 Gastro-esophageal reflux disease without esophagitis: Secondary | ICD-10-CM

## 2017-07-27 DIAGNOSIS — J302 Other seasonal allergic rhinitis: Secondary | ICD-10-CM

## 2017-07-27 DIAGNOSIS — F411 Generalized anxiety disorder: Secondary | ICD-10-CM

## 2017-07-27 DIAGNOSIS — E559 Vitamin D deficiency, unspecified: Secondary | ICD-10-CM | POA: Diagnosis not present

## 2017-07-27 DIAGNOSIS — Z0001 Encounter for general adult medical examination with abnormal findings: Secondary | ICD-10-CM

## 2017-07-27 DIAGNOSIS — R739 Hyperglycemia, unspecified: Secondary | ICD-10-CM | POA: Diagnosis not present

## 2017-07-27 DIAGNOSIS — Z13220 Encounter for screening for lipoid disorders: Secondary | ICD-10-CM | POA: Diagnosis not present

## 2017-07-27 DIAGNOSIS — Z136 Encounter for screening for cardiovascular disorders: Secondary | ICD-10-CM | POA: Diagnosis not present

## 2017-07-27 LAB — HEPATIC FUNCTION PANEL
ALBUMIN: 3.6 g/dL (ref 3.5–5.2)
ALT: 23 U/L (ref 0–35)
AST: 34 U/L (ref 0–37)
Alkaline Phosphatase: 41 U/L (ref 39–117)
Bilirubin, Direct: 0.1 mg/dL (ref 0.0–0.3)
Total Bilirubin: 0.4 mg/dL (ref 0.2–1.2)
Total Protein: 6.2 g/dL (ref 6.0–8.3)

## 2017-07-27 LAB — LIPID PANEL
CHOLESTEROL: 184 mg/dL (ref 0–200)
HDL: 47.3 mg/dL (ref >39.00)
LDL Cholesterol: 113 mg/dL — ABNORMAL HIGH (ref 0–99)
NONHDL: 136.41
Total CHOL/HDL Ratio: 4
Triglycerides: 115 mg/dL (ref 0.0–149.0)
VLDL: 23 mg/dL (ref 0.0–40.0)

## 2017-07-27 LAB — HEMOGLOBIN A1C: HEMOGLOBIN A1C: 6.4 % (ref 4.6–6.5)

## 2017-07-27 LAB — CBC
HEMATOCRIT: 40.2 % (ref 36.0–46.0)
HEMOGLOBIN: 13.6 g/dL (ref 12.0–15.0)
MCHC: 33.7 g/dL (ref 30.0–36.0)
MCV: 92.6 fl (ref 78.0–100.0)
Platelets: 312 10*3/uL (ref 150.0–400.0)
RBC: 4.34 Mil/uL (ref 3.87–5.11)
RDW: 13.1 % (ref 11.5–15.5)
WBC: 5.4 10*3/uL (ref 4.0–10.5)

## 2017-07-27 MED ORDER — AMLODIPINE BESYLATE 5 MG PO TABS: 5.0000 mg | ORAL_TABLET | Freq: Every day | ORAL | 1 refills | Status: DC

## 2017-07-27 MED ORDER — TRIAMTERENE-HCTZ 37.5-25 MG PO TABS: 1.0000 | ORAL_TABLET | Freq: Every day | ORAL | 1 refills | Status: DC

## 2017-07-27 MED ORDER — FLUTICASONE PROPIONATE 50 MCG/ACT NA SUSP: 1.0000 | Freq: Every day | NASAL | 5 refills | Status: DC

## 2017-07-27 MED ORDER — OMEPRAZOLE 20 MG PO CPDR: 20.0000 mg | DELAYED_RELEASE_CAPSULE | Freq: Every day | ORAL | 1 refills | Status: DC

## 2017-07-27 MED ORDER — VITAMIN D (ERGOCALCIFEROL) 50 MCG (2000 UT) PO CAPS: 1.0000 | ORAL_CAPSULE | Freq: Every day | ORAL | 1 refills | Status: AC

## 2017-07-27 NOTE — Progress Notes (Signed)
Subjective:    Patient ID: Abigail [name], female    DOB: December 30, 1970, 47 y.o.   MRN: [id number]  Patient presents today for complete physical   Anxiety  Presents for follow-up visit. Symptoms include chest pain, dizziness, excessive worry, feeling of choking, hyperventilation, insomnia, irritability, muscle tension, nervous/anxious behavior, palpitations, panic and restlessness. Patient reports no decreased concentration, depressed mood, impotence, malaise, nausea, obsessions, shortness of breath or suicidal ideas. Symptoms occur most days. The quality of sleep is fair. Nighttime awakenings: occasional.   Side effects of treatment include GI discomfort and headaches.  used zoloft in past, d/c due to feeling "zoned out" Does not want to use another medication at this time.   Has  CT coronary scheduled for 08/06/2017.  Immunizations: (TDAP, Hep C screen, Pneumovax, Influenza, zoster)  Health Maintenance  Topic Date Due  . Mammogram  10/04/1987  . Pap Smear  07/29/2019  . Tetanus Vaccine  04/10/2024  . Flu Shot  Completed  . HIV Screening  Completed   Diet:low fat and low carb.  Weight:  Wt Readings from Last 3 Encounters:  07/27/17 202 lb (91.6 kg)  07/06/17 201 lb (91.2 kg)  07/01/17 202 lb (91.6 kg)    Exercise:none at this time.  Fall Risk: Fall Risk  03/19/2017 08/20/2016 06/17/2012  Falls in the past year? No No No   Depression/Suicide: Depression screen [facility] 2/9 07/27/2017 03/19/2017 08/20/2016 06/17/2012  Decreased Interest 0 0 0 0  Down, Depressed, Hopeless 0 0 0 0  PHQ - 2 Score 0 0 0 0  Altered sleeping 1 - - -  Tired, decreased energy 0 - - -  Change in appetite 0 - - -  Feeling bad or failure about yourself  0 - - -  Trouble concentrating 0 - - -  Moving slowly or fidgety/restless 0 - - -  Suicidal thoughts 0 - - -  PHQ-9 Score 1 - - -   Mammogram (yearly, >38yrs):needed.  Vision:up to date with [facility] care.  Dental:will schedule  Advanced  Directive: Advanced Directives 01/24/2016  Does Patient Have a Medical Advance Directive? No  Would patient like information on creating a medical advance directive? No - patient declined information   Medications and allergies reviewed with patient and updated if appropriate.  Patient Active Problem List   Diagnosis Date Noted  . Vitamin D insufficiency 07/27/2017  . Seasonal allergic rhinitis 07/27/2017  . Acute intractable headache 06/25/2017  . Gallop rhythm 06/25/2017  . Generalized anxiety disorder 04/10/2016  . Hyperglycemia 04/10/2016  . GERD (gastroesophageal reflux disease) 04/10/2016  . Environmental allergies 06/17/2012  . S/p Abdominal Supracervical Hysterectomy, Left Salpingoohorectomy, Right Salpingectomy on 01/18/12 01/18/2012  . HTN (hypertension) 10/29/2011    Current Outpatient Medications on File Prior to Visit  Medication Sig Dispense Refill  . cetirizine (ZYRTEC) 10 MG tablet Take 10 mg by mouth daily.    Marland Kitchen saccharomyces boulardii (FLORASTOR) 250 MG capsule Take 250 mg by mouth 2 (two) times daily.    . metoprolol tartrate (LOPRESSOR) 50 MG tablet Take 1 tablet (50 mg total) by mouth on call for 1 dose. One dose the night prior to CT scan at 6 PM and repeat 1 hour prior to CT scan 2 tablet 0   No current facility-administered medications on file prior to visit.     Past Medical History:  Diagnosis Date  . Anemia    iron supp  . Anxiety   . Depression   . Fibroids   .  GERD (gastroesophageal reflux disease)    omeprazole  . Headache(784.0)   . Hypertension 2012   Maxide  . S/P LEEP 1992  . Urticaria     Past Surgical History:  Procedure Laterality Date  . LEEP  1998   Abnormal pap  . SUPRACERVICAL ABDOMINAL HYSTERECTOMY  01/18/2012   Procedure: HYSTERECTOMY SUPRACERVICAL ABDOMINAL with LSO;  Surgeon: Osborne Oman, MD;  Location: [facility];  Service: Gynecology;  Laterality: N/A;  . TUBAL LIGATION  1997  . WISDOM TOOTH EXTRACTION      Social  History   Socioeconomic History  . Marital status: Married    Spouse name: None  . Number of children: None  . Years of education: 74  . Highest education level: None  Social Needs  . Financial resource strain: None  . Food insecurity - worry: None  . Food insecurity - inability: None  . Transportation needs - medical: None  . Transportation needs - non-medical: None  Occupational History  . Occupation: MEDICAL ASSISTANT    Employer: [employer]  Tobacco Use  . Smoking status: Never Smoker  . Smokeless tobacco: Never Used  Substance and Sexual Activity  . Alcohol use: Yes    Alcohol/week: 0.5 oz    Types: 1 Standard drinks or equivalent per week    Comment: occasional  . Drug use: No  . Sexual activity: Yes    Birth control/protection: Surgical    Comment: Hysterectomy  Other Topics Concern  . None  Social History Narrative   Regular exercise-no   Caffeine Use-yes    Family History  Problem Relation Age of Onset  . Hypertension Father   . Depression Father   . Diabetes Father   . Alcohol abuse Father   . Colonic polyp Father   . Diabetes Sister   . Diabetes Brother   . Hypertension Mother   . Arthritis Mother   . Pulmonary fibrosis Mother   . Hypertension Sister   . Cancer Paternal Grandmother        breast  . Heart attack Paternal Grandmother   . Allergic rhinitis Neg Hx   . Angioedema Neg Hx   . Asthma Neg Hx   . Eczema Neg Hx   . Immunodeficiency Neg Hx   . Urticaria Neg Hx        Review of Systems  Constitutional: Positive for irritability.  Respiratory: Negative for shortness of breath.   Cardiovascular: Positive for chest pain and palpitations.  Gastrointestinal: Negative for nausea.  Genitourinary: Negative for impotence.  Neurological: Positive for dizziness.  Psychiatric/Behavioral: Negative for decreased concentration and suicidal ideas. The patient is nervous/anxious and has insomnia.     Objective:   Vitals:   07/27/17 0806   BP: 124/80  Pulse: 93  Temp: 98 F (36.7 C)  SpO2: 99%    Body mass index is 32.6 kg/m.   Physical Examination:  Physical Exam  Constitutional: She is oriented to person, place, and time and well-developed, well-nourished, and in no distress. No distress.  HENT:  Right Ear: External ear normal.  Left Ear: External ear normal.  Nose: Nose normal.  Mouth/Throat: Oropharynx is clear and moist. No oropharyngeal exudate.  Eyes: Conjunctivae and EOM are normal. Pupils are equal, round, and reactive to light. No scleral icterus.  Neck: Normal range of motion. Neck supple. No thyromegaly present.  Cardiovascular: Normal rate, regular rhythm, normal heart sounds and intact distal pulses.  Pulmonary/Chest: Effort normal and breath sounds normal. She exhibits no tenderness.  Abdominal: Soft. Bowel sounds are normal. She exhibits no distension. There is no tenderness.  Genitourinary:  Genitourinary Comments: Declined pelvic and breast exam  Musculoskeletal: Normal range of motion. She exhibits no edema or tenderness.  Lymphadenopathy:    She has no cervical adenopathy.  Neurological: She is alert and oriented to person, place, and time. Gait normal.  Skin: Skin is warm and dry.  Psychiatric: Mood, memory, affect and judgment normal.  Vitals reviewed.   ASSESSMENT and PLAN:  Donalee was seen today for annual exam.  Diagnoses and all orders for this visit:  Encounter for preventative adult health care exam with abnormal findings -     MM DIGITAL SCREENING BILATERAL; Future -     CBC -     Lipid panel -     Hepatic function panel  Generalized anxiety disorder -     Ambulatory referral to Psychology  Hyperglycemia -     Hemoglobin A1c -     Hemoglobin A1c; Future  Essential hypertension -     amLODipine (NORVASC) 5 MG tablet; Take 1 tablet (5 mg total) by mouth daily. -     triamterene-hydrochlorothiazide (MAXZIDE-25) 37.5-25 MG tablet; Take 1 tablet by mouth  daily.  Breast cancer screening by mammogram -     MM DIGITAL SCREENING BILATERAL; Future  Encounter for lipid screening for cardiovascular disease  Gastroesophageal reflux disease, esophagitis presence not specified -     omeprazole (PRILOSEC) 20 MG capsule; Take 1 capsule (20 mg total) by mouth daily.  Vitamin D insufficiency -     Vitamin D, Ergocalciferol, 2000 units CAPS; Take 1 capsule by mouth daily after breakfast.  Seasonal allergic rhinitis, unspecified trigger -     fluticasone (FLONASE) 50 MCG/ACT nasal spray; Place 1 spray into both nostrils daily.   No problem-specific Assessment & Plan notes found for this encounter.     Follow up: Return in about 6 months (around 01/27/2018) for DM and HTN (fasting).  Wilfred Lacy, NP

## 2017-07-27 NOTE — Patient Instructions (Addendum)
Improved LDL. Normal hepatic panel and cbc Increased hgbA1c from 5.6 to 6.4. It is imperative that you mainatin a low carb/low fat diet, and regular diet. Return to lab for repeat Hgb A1c in 44months  You will be called to schedule appt for mammogram and with psychology.  Maintain appt with cardiology.   Living With Anxiety After being diagnosed with an anxiety disorder, you may be relieved to know why you have felt or behaved a certain way. It is natural to also feel overwhelmed about the treatment ahead and what it will mean for your life. With care and support, you can manage this condition and recover from it. How to cope with anxiety Dealing with stress Stress is your body's reaction to life changes and events, both good and bad. Stress can last just a few hours or it can be ongoing. Stress can play a major role in anxiety, so it is important to learn both how to cope with stress and how to think about it differently. Talk with your health care provider or a counselor to learn more about stress reduction. He or she may suggest some stress reduction techniques, such as:  Music therapy. This can include creating or listening to music that you enjoy and that inspires you.  Mindfulness-based meditation. This involves being aware of your normal breaths, rather than trying to control your breathing. It can be done while sitting or walking.  Centering prayer. This is a kind of meditation that involves focusing on a word, phrase, or sacred image that is meaningful to you and that brings you peace.  Deep breathing. To do this, expand your stomach and inhale slowly through your nose. Hold your breath for 3-5 seconds. Then exhale slowly, allowing your stomach muscles to relax.  Self-talk. This is a skill where you identify thought patterns that lead to anxiety reactions and correct those thoughts.  Muscle relaxation. This involves tensing muscles then relaxing them.  Choose a stress reduction  technique that fits your lifestyle and personality. Stress reduction techniques take time and practice. Set aside 5-15 minutes a day to do them. Therapists can offer training in these techniques. The training may be covered by some insurance plans. Other things you can do to manage stress include:  Keeping a stress diary. This can help you learn what triggers your stress and ways to control your response.  Thinking about how you respond to certain situations. You may not be able to control everything, but you can control your reaction.  Making time for activities that help you relax, and not feeling guilty about spending your time in this way.  Therapy combined with coping and stress-reduction skills provides the best chance for successful treatment. Medicines Medicines can help ease symptoms. Medicines for anxiety include:  Anti-anxiety drugs.  Antidepressants.  Beta-blockers.  Medicines may be used as the main treatment for anxiety disorder, along with therapy, or if other treatments are not working. Medicines should be prescribed by a health care provider. Relationships Relationships can play a big part in helping you recover. Try to spend more time connecting with trusted friends and family members. Consider going to couples counseling, taking family education classes, or going to family therapy. Therapy can help you and others better understand the condition. How to recognize changes in your condition Everyone has a different response to treatment for anxiety. Recovery from anxiety happens when symptoms decrease and stop interfering with your daily activities at home or work. This may mean that you will  start to:  Have better concentration and focus.  Sleep better.  Be less irritable.  Have more energy.  Have improved memory.  It is important to recognize when your condition is getting worse. Contact your health care provider if your symptoms interfere with home or work and you  do not feel like your condition is improving. Where to find help and support: You can get help and support from these sources:  Self-help groups.  Online and [employer].  A trusted spiritual leader.  Couples counseling.  Family education classes.  Family therapy.  Follow these instructions at home:  Eat a healthy diet that includes plenty of vegetables, fruits, whole grains, low-fat dairy products, and lean protein. Do not eat a lot of foods that are high in solid fats, added sugars, or salt.  Exercise. Most adults should do the following: ? Exercise for at least 150 minutes each week. The exercise should increase your heart rate and make you sweat (moderate-intensity exercise). ? Strengthening exercises at least twice a week.  Cut down on caffeine, tobacco, alcohol, and other potentially harmful substances.  Get the right amount and quality of sleep. Most adults need 7-9 hours of sleep each night.  Make choices that simplify your life.  Take over-the-counter and prescription medicines only as told by your health care provider.  Avoid caffeine, alcohol, and certain over-the-counter cold medicines. These may make you feel worse. Ask your pharmacist which medicines to avoid.  Keep all follow-up visits as told by your health care provider. This is important. Questions to ask your health care provider  Would I benefit from therapy?  How often should I follow up with a health care provider?  How long do I need to take medicine?  Are there any long-term side effects of my medicine?  Are there any alternatives to taking medicine? Contact a health care provider if:  You have a hard time staying focused or finishing daily tasks.  You spend many hours a day feeling worried about everyday life.  You become exhausted by worry.  You start to have headaches, feel tense, or have nausea.  You urinate more than normal.  You have diarrhea. Get help right away  if:  You have a racing heart and shortness of breath.  You have thoughts of hurting yourself or others. If you ever feel like you may hurt yourself or others, or have thoughts about taking your own life, get help right away. You can go to your nearest emergency department or call:  Your local emergency services (911 in the [place]  A suicide crisis helpline, such as the [facility] at 631-664-6938. This is open 24-hours a day.  Summary  Taking steps to deal with stress can help calm you.  Medicines cannot cure anxiety disorders, but they can help ease symptoms.  Family, friends, and partners can play a big part in helping you recover from an anxiety disorder. This information is not intended to replace advice given to you by your health care provider. Make sure you discuss any questions you have with your health care provider. Document Released: 04/21/2016 Document Revised: 04/21/2016 Document Reviewed: 04/21/2016 Elsevier Interactive Patient Education  [employer].

## 2017-08-06 ENCOUNTER — Ambulatory Visit (HOSPITAL_COMMUNITY)
Admission: RE | Admit: 2017-08-06 | Discharge: 2017-08-06 | Disposition: A | Discharge status: Home / Self Care | Payer: BLUE CROSS/BLUE SHIELD | Source: Ambulatory Visit | Attending: Nurse Practitioner | Admitting: Nurse Practitioner

## 2017-08-06 ENCOUNTER — Ambulatory Visit (HOSPITAL_COMMUNITY): Payer: BLUE CROSS/BLUE SHIELD

## 2017-08-06 DIAGNOSIS — R079 Chest pain, unspecified: Secondary | ICD-10-CM

## 2017-08-06 DIAGNOSIS — R002 Palpitations: Secondary | ICD-10-CM | POA: Diagnosis present

## 2017-08-06 DIAGNOSIS — K449 Diaphragmatic hernia without obstruction or gangrene: Secondary | ICD-10-CM | POA: Insufficient documentation

## 2017-08-06 DIAGNOSIS — I10 Essential (primary) hypertension: Secondary | ICD-10-CM | POA: Insufficient documentation

## 2017-08-06 MED ORDER — IOPAMIDOL (ISOVUE-370) INJECTION 76%
INTRAVENOUS | Status: AC
  Administered 2017-08-06: 80 mL
  Filled 2017-08-06: qty 100

## 2017-08-06 MED ORDER — NITROGLYCERIN 0.4 MG SL SUBL
0.8000 mg | SUBLINGUAL_TABLET | SUBLINGUAL | Status: DC | PRN
  Administered 2017-08-06: 0.8 mg via SUBLINGUAL
  Filled 2017-08-06 (×2): qty 25

## 2017-08-06 MED ORDER — METOPROLOL TARTRATE 5 MG/5ML IV SOLN
INTRAVENOUS | Status: AC
  Administered 2017-08-06: 5 mg via INTRAVENOUS
  Filled 2017-08-06: qty 20

## 2017-08-06 MED ORDER — NITROGLYCERIN 0.4 MG SL SUBL
SUBLINGUAL_TABLET | SUBLINGUAL | Status: AC
  Administered 2017-08-06: 0.8 mg via SUBLINGUAL
  Filled 2017-08-06: qty 2

## 2017-08-06 MED ORDER — METOPROLOL TARTRATE 5 MG/5ML IV SOLN
5.0000 mg | INTRAVENOUS | Status: DC | PRN
  Administered 2017-08-06 (×3): 5 mg via INTRAVENOUS
  Filled 2017-08-06 (×4): qty 5

## 2017-08-10 ENCOUNTER — Encounter: Payer: Self-pay | Admitting: Nurse Practitioner

## 2017-08-10 DIAGNOSIS — Z1231 Encounter for screening mammogram for malignant neoplasm of breast: Secondary | ICD-10-CM

## 2017-09-09 ENCOUNTER — Ambulatory Visit: Payer: BLUE CROSS/BLUE SHIELD | Admitting: Psychology

## 2017-09-09 DIAGNOSIS — F4323 Adjustment disorder with mixed anxiety and depressed mood: Secondary | ICD-10-CM | POA: Diagnosis not present

## 2017-10-01 ENCOUNTER — Ambulatory Visit: Payer: BLUE CROSS/BLUE SHIELD | Admitting: Psychology

## 2017-10-15 ENCOUNTER — Ambulatory Visit
Admission: RE | Admit: 2017-10-15 | Discharge: 2017-10-15 | Disposition: A | Discharge status: Home / Self Care | Payer: BLUE CROSS/BLUE SHIELD | Source: Ambulatory Visit | Attending: Nurse Practitioner | Admitting: Nurse Practitioner

## 2017-10-15 DIAGNOSIS — Z1231 Encounter for screening mammogram for malignant neoplasm of breast: Secondary | ICD-10-CM

## 2017-11-05 ENCOUNTER — Ambulatory Visit: Payer: BLUE CROSS/BLUE SHIELD | Admitting: Psychology

## 2017-11-19 ENCOUNTER — Ambulatory Visit: Payer: BLUE CROSS/BLUE SHIELD | Admitting: Nurse Practitioner

## 2017-11-19 ENCOUNTER — Encounter: Payer: Self-pay | Admitting: Nurse Practitioner

## 2017-11-19 ENCOUNTER — Ambulatory Visit: Payer: BLUE CROSS/BLUE SHIELD | Admitting: Psychology

## 2017-11-19 VITALS — BP 126/78 | HR 87 | Temp 98.5°F | Ht 66.0 in | Wt 198.0 lb

## 2017-11-19 DIAGNOSIS — G8929 Other chronic pain: Secondary | ICD-10-CM

## 2017-11-19 DIAGNOSIS — L249 Irritant contact dermatitis, unspecified cause: Secondary | ICD-10-CM | POA: Diagnosis not present

## 2017-11-19 DIAGNOSIS — M545 Low back pain, unspecified: Secondary | ICD-10-CM

## 2017-11-19 MED ORDER — IBUPROFEN-FAMOTIDINE 800-26.6 MG PO TABS: 1.0000 | ORAL_TABLET | Freq: Two times a day (BID) | ORAL | 0 refills | Status: DC

## 2017-11-19 MED ORDER — TRIAMCINOLONE ACETONIDE 0.5 % EX OINT: 1.0000 "application " | TOPICAL_OINTMENT | Freq: Two times a day (BID) | CUTANEOUS | 0 refills | Status: DC

## 2017-11-19 MED ORDER — CYCLOBENZAPRINE HCL 5 MG PO TABS: 5.0000 mg | ORAL_TABLET | Freq: Every day | ORAL | 0 refills | Status: DC

## 2017-11-19 NOTE — Progress Notes (Signed)
Subjective:  Patient ID: Abigail [name], female    DOB: October 12, 1970  Age: 47 y.o. MRN: [id number]  CC: Rash (pt has been having rash,bumps and patches on arms mostly/ she saw allergy doc before and she got a cream--not sure if it is helping, pt report itchy at times) and Back Pain (this has been going on for awhile, it is getting wrose after laydown,sit down and standing. pt report pain scale of 3 right now but it is depend on her activities.  )  Rash  This is a new problem. The current episode started in the past 7 days. The problem has been gradually improving since onset. The affected locations include the left arm. The rash is characterized by itchiness. It is unknown if there was an exposure to a precipitant. Pertinent negatives include no eye pain, fatigue, fever, joint pain or nail changes. Past treatments include topical steroids. The treatment provided moderate relief. Her past medical history is significant for allergies and eczema.  Back Pain  This is a chronic problem. The current episode started more than 1 year ago. The problem occurs intermittently. The problem has been waxing and waning since onset. The pain is present in the lumbar spine (mid lower back). The quality of the pain is described as aching and cramping. The pain does not radiate. The symptoms are aggravated by bending, standing and lying down. Stiffness is present at night. Pertinent negatives include no abdominal pain, bladder incontinence, bowel incontinence, chest pain, dysuria, fever, headaches, leg pain, numbness, paresis, paresthesias, pelvic pain, perianal numbness, tingling, weakness or weight loss. Risk factors include poor posture, sedentary lifestyle, obesity and lack of exercise. She has tried home exercises for the symptoms. The treatment provided mild relief.  back pain is worse at night, or laying in one position for long. DG lumbar done 2018: Mild lumbar levoscoliosis.  No significant degenerative  change.  Reviewed [place] today.  Outpatient Medications Prior to Visit  Medication Sig Dispense Refill  . amLODipine (NORVASC) 5 MG tablet Take 1 tablet (5 mg total) by mouth daily. 90 tablet 1  . cetirizine (ZYRTEC) 10 MG tablet Take 10 mg by mouth daily.    . fluticasone (FLONASE) 50 MCG/ACT nasal spray Place 1 spray into both nostrils daily. 16 g 5  . omeprazole (PRILOSEC) 20 MG capsule Take 1 capsule (20 mg total) by mouth daily. 90 capsule 1  . triamterene-hydrochlorothiazide (MAXZIDE-25) 37.5-25 MG tablet Take 1 tablet by mouth daily. 90 tablet 1  . Vitamin D, Ergocalciferol, 2000 units CAPS Take 1 capsule by mouth daily after breakfast. 90 capsule 1  . metoprolol tartrate (LOPRESSOR) 50 MG tablet Take 1 tablet (50 mg total) by mouth on call for 1 dose. One dose the night prior to CT scan at 6 PM and repeat 1 hour prior to CT scan 2 tablet 0  . saccharomyces boulardii (FLORASTOR) 250 MG capsule Take 250 mg by mouth 2 (two) times daily.     No facility-administered medications prior to visit.     ROS See HPI  Objective:  BP 126/78   Pulse 87   Temp 98.5 F (36.9 C) (Oral)   Ht 5\' 6"  (1.676 m)   Wt 198 lb (89.8 kg)   LMP 01/18/2012 (Exact Date)   SpO2 97%   BMI 31.96 kg/m   BP Readings from Last 3 Encounters:  11/19/17 126/78  08/06/17 105/63  07/27/17 124/80    Wt Readings from Last 3 Encounters:  11/19/17 198 lb (89.8 kg)  07/27/17 202 lb (91.6 kg)  07/06/17 201 lb (91.2 kg)    Physical Exam  Constitutional: She is oriented to person, place, and time.  Cardiovascular: Normal rate.  Pulmonary/Chest: Effort normal.  Musculoskeletal: Normal range of motion. She exhibits no edema or tenderness.       Right hip: Normal.       Left hip: Normal.       Right knee: Normal.       Left knee: Normal.       Lumbar back: Normal.       Right upper leg: Normal.       Left upper leg: Normal.  Neurological: She is alert and oriented to person, place, and time. Coordination  normal.  Skin: Skin is warm, dry and intact. Rash noted. Rash is macular.     Psychiatric: She has a normal mood and affect. Her behavior is normal.  Vitals reviewed.   Lab Results  Component Value Date   WBC 5.4 07/27/2017   HGB 13.6 07/27/2017   HCT 40.2 07/27/2017   PLT 312.0 07/27/2017   GLUCOSE 92 07/06/2017   CHOL 184 07/27/2017   TRIG 115.0 07/27/2017   HDL 47.30 07/27/2017   LDLCALC 113 (H) 07/27/2017   ALT 23 07/27/2017   AST 34 07/27/2017   NA 136 07/06/2017   K 4.1 07/06/2017   CL 98 07/06/2017   CREATININE 0.90 07/06/2017   BUN 13 07/06/2017   CO2 22 07/06/2017   TSH 0.491 07/06/2017   HGBA1C 6.4 07/27/2017   Mm 3d Screen Breast Bilateral  Result Date: 10/18/2017 CLINICAL DATA:  Screening. EXAM: DIGITAL SCREENING BILATERAL MAMMOGRAM WITH TOMO AND CAD COMPARISON:  Previous exam(s). ACR Breast Density Category b: There are scattered areas of fibroglandular density. FINDINGS: There are no findings suspicious for malignancy. Images were processed with CAD. IMPRESSION: No mammographic evidence of malignancy. A result letter of this screening mammogram will be mailed directly to the patient. RECOMMENDATION: Screening mammogram in one year. (Code:SM-B-01Y) BI-RADS CATEGORY  1: Negative. Electronically Signed   By: Ammie Ferrier M.D.   On: 10/18/2017 09:34   Assessment & Plan:   Kaneisha was seen today for rash and back pain.  Diagnoses and all orders for this visit:  Chronic midline low back pain without sciatica -     Ambulatory referral to Sports Medicine -     Ibuprofen-Famotidine (DUEXIS) 800-26.6 MG TABS; Take 1 tablet by mouth 2 (two) times daily at 8 am and 10 pm. -     cyclobenzaprine (FLEXERIL) 5 MG tablet; Take 1 tablet (5 mg total) by mouth at bedtime.  Irritant contact dermatitis, unspecified trigger -     triamcinolone ointment (KENALOG) 0.5 %; Apply 1 application topically 2 (two) times daily.   I am having Abigail [name] start on triamcinolone  ointment, Ibuprofen-Famotidine, and cyclobenzaprine. I am also having her maintain her cetirizine, saccharomyces boulardii, metoprolol tartrate, amLODipine, fluticasone, omeprazole, triamterene-hydrochlorothiazide, and Vitamin D (Ergocalciferol).  Meds ordered this encounter  Medications  . triamcinolone ointment (KENALOG) 0.5 %    Sig: Apply 1 application topically 2 (two) times daily.    Dispense:  30 g    Refill:  0    Order Specific Question:   Supervising Provider    Answer:   MATTHEWS, CODY [[id number]]  . Ibuprofen-Famotidine (DUEXIS) 800-26.6 MG TABS    Sig: Take 1 tablet by mouth 2 (two) times daily at 8 am and 10 pm.    Dispense:  21 tablet  Refill:  0    Order Specific Question:   Supervising Provider    Answer:   MATTHEWS, CODY [[id number]]  . cyclobenzaprine (FLEXERIL) 5 MG tablet    Sig: Take 1 tablet (5 mg total) by mouth at bedtime.    Dispense:  14 tablet    Refill:  0    Order Specific Question:   Supervising Provider    Answer:   MATTHEWS, CODY [[id number]]   Follow-up: Return if symptoms worsen or fail to improve.  Wilfred Lacy, NP

## 2017-11-19 NOTE — Patient Instructions (Signed)
Schedule appt with Dr. Tamala Julian at front desk.  Do back and hip exercise daily. Alternate between warm and cold compress as needed.   Back Exercises If you have pain in your back, do these exercises 2-3 times each day or as told by your doctor. When the pain goes away, do the exercises once each day, but repeat the steps more times for each exercise (do more repetitions). If you do not have pain in your back, do these exercises once each day or as told by your doctor. Exercises Single Knee to Chest  Do these steps 3-5 times in a row for each leg: 1. Lie on your back on a firm bed or the floor with your legs stretched out. 2. Bring one knee to your chest. 3. Hold your knee to your chest by grabbing your knee or thigh. 4. Pull on your knee until you feel a gentle stretch in your lower back. 5. Keep doing the stretch for 10-30 seconds. 6. Slowly let go of your leg and straighten it.  Pelvic Tilt  Do these steps 5-10 times in a row: 1. Lie on your back on a firm bed or the floor with your legs stretched out. 2. Bend your knees so they point up to the ceiling. Your feet should be flat on the floor. 3. Tighten your lower belly (abdomen) muscles to press your lower back against the floor. This will make your tailbone point up to the ceiling instead of pointing down to your feet or the floor. 4. Stay in this position for 5-10 seconds while you gently tighten your muscles and breathe evenly.  Cat-Cow  Do these steps until your lower back bends more easily: 1. Get on your hands and knees on a firm surface. Keep your hands under your shoulders, and keep your knees under your hips. You may put padding under your knees. 2. Let your head hang down, and make your tailbone point down to the floor so your lower back is round like the back of a cat. 3. Stay in this position for 5 seconds. 4. Slowly lift your head and make your tailbone point up to the ceiling so your back hangs low (sags) like the back  of a cow. 5. Stay in this position for 5 seconds.  Press-Ups  Do these steps 5-10 times in a row: 1. Lie on your belly (face-down) on the floor. 2. Place your hands near your head, about shoulder-width apart. 3. While you keep your back relaxed and keep your hips on the floor, slowly straighten your arms to raise the top half of your body and lift your shoulders. Do not use your back muscles. To make yourself more comfortable, you may change where you place your hands. 4. Stay in this position for 5 seconds. 5. Slowly return to lying flat on the floor.  Bridges  Do these steps 10 times in a row: 1. Lie on your back on a firm surface. 2. Bend your knees so they point up to the ceiling. Your feet should be flat on the floor. 3. Tighten your butt muscles and lift your butt off of the floor until your waist is almost as high as your knees. If you do not feel the muscles working in your butt and the back of your thighs, slide your feet 1-2 inches farther away from your butt. 4. Stay in this position for 3-5 seconds. 5. Slowly lower your butt to the floor, and let your butt muscles relax.  If this exercise is too easy, try doing it with your arms crossed over your chest. Belly Crunches  Do these steps 5-10 times in a row: 1. Lie on your back on a firm bed or the floor with your legs stretched out. 2. Bend your knees so they point up to the ceiling. Your feet should be flat on the floor. 3. Cross your arms over your chest. 4. Tip your chin a little bit toward your chest but do not bend your neck. 5. Tighten your belly muscles and slowly raise your chest just enough to lift your shoulder blades a tiny bit off of the floor. 6. Slowly lower your chest and your head to the floor.  Back Lifts Do these steps 5-10 times in a row: 1. Lie on your belly (face-down) with your arms at your sides, and rest your forehead on the floor. 2. Tighten the muscles in your legs and your butt. 3. Slowly lift  your chest off of the floor while you keep your hips on the floor. Keep the back of your head in line with the curve in your back. Look at the floor while you do this. 4. Stay in this position for 3-5 seconds. 5. Slowly lower your chest and your face to the floor.  Contact a doctor if:  Your back pain gets a lot worse when you do an exercise.  Your back pain does not lessen 2 hours after you exercise. If you have any of these problems, stop doing the exercises. Do not do them again unless your doctor says it is okay. Get help right away if:  You have sudden, very bad back pain. If this happens, stop doing the exercises. Do not do them again unless your doctor says it is okay. This information is not intended to replace advice given to you by your health care provider. Make sure you discuss any questions you have with your health care provider. Document Released: 05/30/2010 Document Revised: 10/03/2015 Document Reviewed: 06/21/2014 Elsevier Interactive Patient Education  2018 [employer].   Hip Exercises Ask your health care provider which exercises are safe for you. Do exercises exactly as told by your health care provider and adjust them as directed. It is normal to feel mild stretching, pulling, tightness, or discomfort as you do these exercises, but you should stop right away if you feel sudden pain or your pain gets worse.Do not begin these exercises until told by your health care provider. STRETCHING AND RANGE OF MOTION EXERCISES These exercises warm up your muscles and joints and improve the movement and flexibility of your hip. These exercises also help to relieve pain, numbness, and tingling. Exercise A: Hamstrings, Supine  1. Lie on your back. 2. Loop a belt or towel over the ball of your left / rightfoot. The ball of your foot is on the walking surface, right under your toes. 3. Straighten your left / rightknee and slowly pull on the belt to raise your leg. ? Do not let your  left / right knee bend while you do this. ? Keep your other leg flat on the floor. ? Raise the left / right leg until you feel a gentle stretch behind your left / right knee or thigh. 4. Hold this position for __________ seconds. 5. Slowly return your leg to the starting position. Repeat __________ times. Complete this stretch __________ times a day. Exercise B: Hip Rotators  1. Lie on your back on a firm surface. 2. Hold your left /  right knee with your left / right hand. Hold your ankle with your other hand. 3. Gently pull your left / right knee and rotate your lower leg toward your other shoulder. ? Pull until you feel a stretch in your buttocks. ? Keep your hips and shoulders firmly planted while you do this stretch. 4. Hold this position for __________ seconds. Repeat __________ times. Complete this stretch __________ times a day. Exercise C: V-Sit (Hamstrings and Adductors)  1. Sit on the floor with your legs extended in a large "V" shape. Keep your knees straight during this exercise. 2. Start with your head and chest upright, then bend at your waist to reach for your left foot (position A). You should feel a stretch in your right inner thigh. 3. Hold this position for __________ seconds. Then slowly return to the upright position. 4. Bend at your waist to reach forward (position B). You should feel a stretch behind both of your thighs and knees. 5. Hold this position for __________ seconds. Then slowly return to the upright position. 6. Bend at your waist to reach for your right foot (position C). You should feel a stretch in your left inner thigh. 7. Hold this position for __________ seconds. Then slowly return to the upright position. Repeat __________ times. Complete this stretch __________ times a day. Exercise D: Lunge (Hip Flexors)  1. Place your left / right knee on the floor and bend your other knee so that is directly over your ankle. You should be half-kneeling. 2. Keep  good posture with your head over your shoulders. 3. Tighten your buttocks to point your tailbone downward. This helps your back to keep from arching too much. 4. You should feel a gentle stretch in the front of your left / right thigh and hip. If you do not feel any resistance, slightly slide your other foot forward and then slowly lunge forward so your knee once again lines up over your ankle. 5. Make sure your tailbone continues to point downward. 6. Hold this position for __________ seconds. Repeat __________ times. Complete this stretch __________ times a day. STRENGTHENING EXERCISES These exercises build strength and endurance in your hip. Endurance is the ability to use your muscles for a long time, even after they get tired. Exercise E: Bridge (Hip Extensors)  1. Lie on your back on a firm surface with your knees bent and your feet flat on the floor. 2. Tighten your buttocks muscles and lift your bottom off the floor until the trunk of your body is level with your thighs. ? Do not arch your back. ? You should feel the muscles working in your buttocks and the back of your thighs. If you do not feel these muscles, slide your feet 1-2 inches (2.5-5 cm) farther away from your buttocks. 3. Hold this position for __________ seconds. 4. Slowly lower your hips to the starting position. 5. Let your muscles relax completely between repetitions. 6. If this exercise is too easy, try doing it with your arms crossed over your chest. Repeat __________ times. Complete this exercise __________ times a day. Exercise F: Straight Leg Raises - Hip Abductors  1. Lie on your side with your left / right leg in the top position. Lie so your head, shoulder, knee, and hip line up with each other. You may bend your bottom knee to help you balance. 2. Roll your hips slightly forward, so your hips are stacked directly over each other and your left / right knee is  facing forward. 3. Leading with your heel, lift your  top leg 4-6 inches (10-15 cm). You should feel the muscles in your outer hip lifting. ? Do not let your foot drift forward. ? Do not let your knee roll toward the ceiling. 4. Hold this position for __________ seconds. 5. Slowly return to the starting position. 6. Let your muscles relax completely between repetitions. Repeat __________ times. Complete this exercise __________ times a day. Exercise G: Straight Leg Raises - Hip Adductors  1. Lie on your side with your left / right leg in the bottom position. Lie so your head, shoulder, knee, and hip line up. You may place your upper foot in front to help you balance. 2. Roll your hips slightly forward, so your hips are stacked directly over each other and your left / right knee is facing forward. 3. Tense the muscles in your inner thigh and lift your bottom leg 4-6 inches (10-15 cm). 4. Hold this position for __________ seconds. 5. Slowly return to the starting position. 6. Let your muscles relax completely between repetitions. Repeat __________ times. Complete this exercise __________ times a day. Exercise H: Straight Leg Raises - Quadriceps  1. Lie on your back with your left / right leg extended and your other knee bent. 2. Tense the muscles in the front of your left / right thigh. When you do this, you should see your kneecap slide up or see increased dimpling just above your knee. 3. Tighten these muscles even more and raise your leg 4-6 inches (10-15 cm) off the floor. 4. Hold this position for __________ seconds. 5. Keep these muscles tense as you lower your leg. 6. Relax the muscles slowly and completely between repetitions. Repeat __________ times. Complete this exercise __________ times a day. Exercise I: Hip Abductors, Standing 1. Tie one end of a rubber exercise band or tubing to a secure surface, such as a table or pole. 2. Loop the other end of the band or tubing around your left / right ankle. 3. Keeping your ankle with the  band or tubing directly opposite of the secured end, step away until there is tension in the tubing or band. Hold onto a chair as needed for balance. 4. Lift your left / right leg out to your side. While you do this: ? Keep your back upright. ? Keep your shoulders over your hips. ? Keep your toes pointing forward. ? Make sure to use your hip muscles to lift your leg. Do not "throw" your leg or tip your body to lift your leg. 5. Hold this position for __________ seconds. 6. Slowly return to the starting position. Repeat __________ times. Complete this exercise __________ times a day. Exercise J: Squats (Quadriceps) 1. Stand in a door frame so your feet and knees are in line with the frame. You may place your hands on the frame for balance. 2. Slowly bend your knees and lower your hips like you are going to sit in a chair. ? Keep your lower legs in a straight-up-and-down position. ? Do not let your hips go lower than your knees. ? Do not bend your knees lower than told by your health care provider. ? If your hip pain increases, do not bend as low. 3. Hold this position for ___________ seconds. 4. Slowly push with your legs to return to standing. Do not use your hands to pull yourself to standing. Repeat __________ times. Complete this exercise __________ times a day. This information is not intended  to replace advice given to you by your health care provider. Make sure you discuss any questions you have with your health care provider. Document Released: 05/15/2005 Document Revised: 01/20/2016 Document Reviewed: 04/22/2015 Elsevier Interactive Patient Education  [employer].
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
## 2017-12-13 ENCOUNTER — Ambulatory Visit: Payer: BLUE CROSS/BLUE SHIELD | Admitting: Family Medicine

## 2017-12-13 ENCOUNTER — Encounter: Payer: Self-pay | Admitting: Family Medicine

## 2017-12-13 VITALS — BP 128/86 | HR 86 | Ht 66.0 in | Wt 193.0 lb

## 2017-12-13 DIAGNOSIS — M545 Low back pain, unspecified: Secondary | ICD-10-CM

## 2017-12-13 DIAGNOSIS — G8929 Other chronic pain: Secondary | ICD-10-CM | POA: Diagnosis not present

## 2017-12-13 MED ORDER — IBUPROFEN-FAMOTIDINE 800-26.6 MG PO TABS: 1.0000 | ORAL_TABLET | Freq: Three times a day (TID) | ORAL | 3 refills | Status: DC

## 2017-12-13 NOTE — Assessment & Plan Note (Signed)
Pain seems to be muscular in nature. No radicular symptoms. Mild scoliosis on xray. Pain likely related to posture as well with sitting all day at work  - duexis  - counseled on HEP  - counseled on supportive care - if no improvement consider PT or trigger point injections.

## 2017-12-13 NOTE — Patient Instructions (Signed)
Nice to meet you  Please try the exercises  Please try to change your posture at work and avoid sitting for prolonged periods of time.  Please follow up with me in 3-4 weeks if your pain doesn't improve

## 2018-01-03 ENCOUNTER — Ambulatory Visit: Payer: BLUE CROSS/BLUE SHIELD | Admitting: Obstetrics and Gynecology

## 2018-01-03 ENCOUNTER — Telehealth: Payer: Self-pay | Admitting: Obstetrics and Gynecology

## 2018-01-03 VITALS — BP 130/78 | HR 74 | Temp 98.1°F | Resp 14 | Ht 66.0 in | Wt 195.0 lb

## 2018-01-03 DIAGNOSIS — R102 Pelvic and perineal pain: Secondary | ICD-10-CM | POA: Diagnosis not present

## 2018-01-03 DIAGNOSIS — N760 Acute vaginitis: Secondary | ICD-10-CM

## 2018-01-03 NOTE — Patient Instructions (Signed)

## 2018-01-03 NOTE — Telephone Encounter (Signed)
Spoke with patient. Patient reports light yellow, vaginal d/c with odor and vaginal discomfort that started 8/22. RLQ pain started 2 days ago. Pain 2/10. States she has hx of urinary urgency and kidney stones, sees urology and PT, denies any new urinary symptoms. Denies fever/chills, N/V, vaginal bleeding or blood in urine. Hx of hysterectomy.   Recommended OV for further evaluation, OV scheduled for today at 2:45pm with Dr. Quincy Simmonds. Patient verbalizes understanding and is agreeable. Encounter closed.

## 2018-01-03 NOTE — Progress Notes (Signed)
GYNECOLOGY  VISIT   HPI: 47 y.o.   Married  [place] American  female   [phone] with Patient's last menstrual period was 01/18/2012 (exact date).   here for   Lower pelvis pain and light discharge with some urinary discomfort. Patient states it has been present for about 3 days.   Notes some vaginal odor for 5 days.   Hx BV.  Has infections in August 2018 and March 2019.   Pain in RLQ is aching.  Not cramping.  Nothing makes it worse.  Ibuprofen helps.   No fever.  No nausea or vomiting.  Good appetite.  No diarrhea.  Normally has constipation.   Some pain with urination.  Feels like she is having more UTIs since she was dx with renal stones.  Had 2 - 3 infection in the last year.   Urine dip  Today - negative.   GYNECOLOGIC HISTORY: Patient's last menstrual period was 01/18/2012 (exact date). Contraception:  Hysterectomy  Menopausal hormone therapy:  None Last mammogram:  10/15/17 Bi-rads category 1 neg  Last pap smear:   3.20.18 neg high risk         OB History    Gravida  3   Para  2   Term  2   Preterm  0   AB  1   Living  2     SAB  1   TAB  0   Ectopic  0   Multiple  0   Live Births  2              Patient Active Problem List   Diagnosis Date Noted  . Chronic right-sided low back pain without sciatica 12/13/2017  . Vitamin D insufficiency 07/27/2017  . Seasonal allergic rhinitis 07/27/2017  . Acute intractable headache 06/25/2017  . Gallop rhythm 06/25/2017  . Generalized anxiety disorder 04/10/2016  . Hyperglycemia 04/10/2016  . GERD (gastroesophageal reflux disease) 04/10/2016  . Environmental allergies 06/17/2012  . S/p Abdominal Supracervical Hysterectomy, Left Salpingoohorectomy, Right Salpingectomy on 01/18/12 01/18/2012  . HTN (hypertension) 10/29/2011    Past Medical History:  Diagnosis Date  . Anemia    iron supp  . Anxiety   . Depression   . Fibroids   . GERD (gastroesophageal reflux disease)    omeprazole  .  Headache(784.0)   . Hypertension 2012   Maxide  . S/P LEEP 1992  . Urticaria     Past Surgical History:  Procedure Laterality Date  . LEEP  1998   Abnormal pap  . SUPRACERVICAL ABDOMINAL HYSTERECTOMY  01/18/2012   Procedure: HYSTERECTOMY SUPRACERVICAL ABDOMINAL with LSO;  Surgeon: Osborne Oman, MD;  Location: [facility];  Service: Gynecology;  Laterality: N/A;  . TUBAL LIGATION  1997  . WISDOM TOOTH EXTRACTION      Current Outpatient Medications  Medication Sig Dispense Refill  . amLODipine (NORVASC) 5 MG tablet Take 1 tablet (5 mg total) by mouth daily. 90 tablet 1  . cetirizine (ZYRTEC) 10 MG tablet Take 10 mg by mouth daily.    . fluticasone (FLONASE) 50 MCG/ACT nasal spray Place 1 spray into both nostrils daily. 16 g 5  . Ibuprofen-Famotidine 800-26.6 MG TABS Take 1 tablet by mouth 3 (three) times daily. 90 tablet 3  . omeprazole (PRILOSEC) 20 MG capsule Take 1 capsule (20 mg total) by mouth daily. 90 capsule 1  . saccharomyces boulardii (FLORASTOR) 250 MG capsule Take 250 mg by mouth 2 (two) times daily.    Marland Kitchen triamcinolone  ointment (KENALOG) 0.5 % Apply 1 application topically 2 (two) times daily. 30 g 0  . triamterene-hydrochlorothiazide (MAXZIDE-25) 37.5-25 MG tablet Take 1 tablet by mouth daily. 90 tablet 1  . Vitamin D, Ergocalciferol, 2000 units CAPS Take 1 capsule by mouth daily after breakfast. 90 capsule 1   No current facility-administered medications for this visit.      ALLERGIES: Shellfish allergy and Tylox [oxycodone-acetaminophen]  Family History  Problem Relation Age of Onset  . Hypertension Father   . Depression Father   . Diabetes Father   . Alcohol abuse Father   . Colonic polyp Father   . Diabetes Sister   . Diabetes Brother   . Hypertension Mother   . Arthritis Mother   . Pulmonary fibrosis Mother   . Hypertension Sister   . Cancer Paternal Grandmother        breast  . Heart attack Paternal Grandmother   . Allergic rhinitis Neg Hx   .  Angioedema Neg Hx   . Asthma Neg Hx   . Eczema Neg Hx   . Immunodeficiency Neg Hx   . Urticaria Neg Hx     Social History   Socioeconomic History  . Marital status: Married    Spouse name: Not on file  . Number of children: Not on file  . Years of education: 5  . Highest education level: Not on file  Occupational History  . Occupation: MEDICAL ASSISTANT    Employer: [employer]  . Financial resource strain: Not on file  . Food insecurity:    Worry: Not on file    Inability: Not on file  . Transportation needs:    Medical: Not on file    Non-medical: Not on file  Tobacco Use  . Smoking status: Never Smoker  . Smokeless tobacco: Never Used  Substance and Sexual Activity  . Alcohol use: Yes    Alcohol/week: 1.0 standard drinks    Types: 1 Standard drinks or equivalent per week    Comment: occasional  . Drug use: No  . Sexual activity: Yes    Birth control/protection: Surgical    Comment: Hysterectomy  Lifestyle  . Physical activity:    Days per week: Not on file    Minutes per session: Not on file  . Stress: Not on file  Relationships  . Social connections:    Talks on phone: Not on file    Gets together: Not on file    Attends religious service: Not on file    Active member of club or organization: Not on file    Attends meetings of clubs or organizations: Not on file    Relationship status: Not on file  . Intimate partner violence:    Fear of current or ex partner: Not on file    Emotionally abused: Not on file    Physically abused: Not on file    Forced sexual activity: Not on file  Other Topics Concern  . Not on file  Social History Narrative   Regular exercise-no   Caffeine Use-yes    Review of Systems  Genitourinary: Positive for urgency.    PHYSICAL EXAMINATION:    BP 130/78 (BP Location: Right Arm, Patient Position: Sitting, Cuff Size: Normal)   Pulse 74   Temp 98.1 F (36.7 C)   Resp 14   Ht 5\' 6"  (1.676 m)   Wt  195 lb (88.5 kg)   LMP 01/18/2012 (Exact Date)   BMI 31.47 kg/m  General appearance: alert, cooperative and appears stated age   Abdomen: soft, non-tender, no masses,  no organomegaly  Pelvic: External genitalia:  no lesions              Urethra:  normal appearing urethra with no masses, tenderness or lesions              Bartholins and Skenes: normal                 Vagina: normal appearing vagina with normal color and discharge, no lesions              Cervix:  absent                Bimanual Exam:  Uterus:   absent              Adnexa: no mass, fullness, tenderness              Rectal exam: Yes.  .  Confirms.              Anus:  normal sphincter tone, no lesions  Chaperone was present for exam.  ASSESSMENT  Status post supracervical hysterectomy, LSO, right salpingectomy for fibroids.  Vaginitis.  Hx recurrent BV.  RLQ pain.  No acute abdomen.  Ovulation?  PLAN  Affirm.  Consider Flagyl 500 mg po bid x 7 nights and then boric acid 600 mg for one month.  If repeat testing is negative, then Metrogel twice weekly for 4 - 6 months.  Return for worsening pain.   An After Visit Summary was printed and given to the patient.  ___15___ minutes face to face time of which over 50% was spent in counseling.

## 2018-01-03 NOTE — Telephone Encounter (Signed)
Patient is having pelvic pain. Patient stated that she has currently has kidney stones. Patient believes she might have a kidney infection. Patient stated that she is having back pain and abdominal pain.

## 2018-01-04 ENCOUNTER — Telehealth: Payer: Self-pay | Admitting: Obstetrics and Gynecology

## 2018-01-04 ENCOUNTER — Encounter: Payer: Self-pay | Admitting: Obstetrics and Gynecology

## 2018-01-04 LAB — VAGINITIS/VAGINOSIS, DNA PROBE
CANDIDA SPECIES: NEGATIVE
Gardnerella vaginalis: NEGATIVE
TRICHOMONAS VAG: NEGATIVE

## 2018-01-04 NOTE — Telephone Encounter (Signed)
Patient sent the following message through [facility]. Routing to triage to assist patient with request.  Hey I was wondering if my test results were back yet?

## 2018-01-04 NOTE — Telephone Encounter (Signed)
Dr. Quincy Simmonds,  Pt testing appears complete. Normal results.  Can you review and advise if any additional recommendations?

## 2018-01-04 NOTE — Telephone Encounter (Signed)
Results to patient through My Chart.  Affirm negative for vaginitis.  If symptoms persist, I recommend she return for further testing.

## 2018-01-05 ENCOUNTER — Encounter: Payer: Self-pay | Admitting: Obstetrics and Gynecology

## 2018-01-05 LAB — POCT URINALYSIS DIPSTICK
BILIRUBIN UA: NEGATIVE
GLUCOSE UA: NEGATIVE
KETONES UA: NEGATIVE
LEUKOCYTES UA: NEGATIVE
Nitrite, UA: NEGATIVE
Protein, UA: NEGATIVE
RBC UA: NEGATIVE
Urobilinogen, UA: 0.2 E.U./dL
pH, UA: 5 (ref 5.0–8.0)

## 2018-01-05 NOTE — Telephone Encounter (Signed)
Patient has read mychart message. Will close encounter.

## 2018-01-28 ENCOUNTER — Ambulatory Visit: Payer: Self-pay | Admitting: Nurse Practitioner

## 2018-02-01 ENCOUNTER — Encounter: Payer: Self-pay | Admitting: Obstetrics and Gynecology

## 2018-02-02 ENCOUNTER — Telehealth: Payer: Self-pay | Admitting: Obstetrics and Gynecology

## 2018-02-02 NOTE — Telephone Encounter (Signed)
Spoke with patient. Reports yellow vaginal d/c with foul odor and intermittent pelvic pain. Describes as dull and achy, 1/10, motrin prn provides relief. No vaginal bleeding. Denies N/V, fever/chills. Patient used 1 dose of metrogel last night that was left over from previous RX, requesting refill. Last vaginitis testing negative 01/03/18. Advised OV needed for further evaluation, scheduled for 9/30 at 8:15am with Dr. Quincy Simmonds. Advised patient to return call to office if new symptoms develop or symptoms worsen, will need earlier OV.   Routing to provider for final review. Patient is agreeable to disposition. Will close encounter.

## 2018-02-02 NOTE — Telephone Encounter (Signed)
Message   Hello Dr Cathie Olden  Well I definately now have BV , the symptoms have come back very strong, yellowish discharge, foul odor, and I have some lower pelvic pain time to time. I did manage to squeeze a half application of metro gel that I had previously, but it's now all gone. I could not take feeling do uncomfortable.Is there anyway I could start on the chronic B V therapy we discussed? I'm unable to take off work this week and I know that small amount won't treat me.

## 2018-02-07 ENCOUNTER — Encounter: Payer: Self-pay | Admitting: Obstetrics and Gynecology

## 2018-02-07 ENCOUNTER — Other Ambulatory Visit: Payer: Self-pay

## 2018-02-07 ENCOUNTER — Ambulatory Visit: Payer: BLUE CROSS/BLUE SHIELD | Admitting: Obstetrics and Gynecology

## 2018-02-07 VITALS — BP 110/58 | HR 78 | Ht 66.0 in | Wt 193.0 lb

## 2018-02-07 DIAGNOSIS — N760 Acute vaginitis: Secondary | ICD-10-CM

## 2018-02-07 NOTE — Progress Notes (Signed)
GYNECOLOGY  VISIT   HPI: 47 y.o.   Married  [place] American  female   [phone] with Patient's last menstrual period was 01/18/2012 (exact date).   here for vaginal discharge.    Having symptoms of discharge and odor for about one week.   Has recurrent BV.  Last testing was negative 01/03/18.   Sometimes she uses left over gel.   Using [facility] and wears a panty shield all the time.  GYNECOLOGIC HISTORY: Patient's last menstrual period was 01/18/2012 (exact date). Contraception:  OCP Menopausal hormone therapy:  none Last mammogram:  10/16/1027 BI-RADS CATEGORY  1: Negative. Last pap smear:   07/28/2016 normal        OB History    Gravida  3   Para  2   Term  2   Preterm  0   AB  1   Living  2     SAB  1   TAB  0   Ectopic  0   Multiple  0   Live Births  2              Patient Active Problem List   Diagnosis Date Noted  . Chronic right-sided low back pain without sciatica 12/13/2017  . Vitamin D insufficiency 07/27/2017  . Seasonal allergic rhinitis 07/27/2017  . Acute intractable headache 06/25/2017  . Gallop rhythm 06/25/2017  . Generalized anxiety disorder 04/10/2016  . Hyperglycemia 04/10/2016  . GERD (gastroesophageal reflux disease) 04/10/2016  . Environmental allergies 06/17/2012  . S/p Abdominal Supracervical Hysterectomy, Left Salpingoohorectomy, Right Salpingectomy on 01/18/12 01/18/2012  . HTN (hypertension) 10/29/2011    Past Medical History:  Diagnosis Date  . Anemia    iron supp  . Anxiety   . Depression   . Fibroids   . GERD (gastroesophageal reflux disease)    omeprazole  . Headache(784.0)   . Hypertension 2012   Maxide  . S/P LEEP 1992  . Urticaria     Past Surgical History:  Procedure Laterality Date  . LEEP  1998   Abnormal pap  . SUPRACERVICAL ABDOMINAL HYSTERECTOMY  01/18/2012   Procedure: HYSTERECTOMY SUPRACERVICAL ABDOMINAL with LSO;  Surgeon: Osborne Oman, MD;  Location: [facility];  Service: Gynecology;   Laterality: N/A;  . TUBAL LIGATION  1997  . WISDOM TOOTH EXTRACTION      Current Outpatient Medications  Medication Sig Dispense Refill  . amLODipine (NORVASC) 5 MG tablet Take 1 tablet (5 mg total) by mouth daily. 90 tablet 1  . cetirizine (ZYRTEC) 10 MG tablet Take 10 mg by mouth daily.    . fluticasone (FLONASE) 50 MCG/ACT nasal spray Place 1 spray into both nostrils daily. 16 g 5  . Ibuprofen-Famotidine 800-26.6 MG TABS Take 1 tablet by mouth 3 (three) times daily. 90 tablet 3  . omeprazole (PRILOSEC) 20 MG capsule Take 1 capsule (20 mg total) by mouth daily. 90 capsule 1  . saccharomyces boulardii (FLORASTOR) 250 MG capsule Take 250 mg by mouth 2 (two) times daily.    Marland Kitchen triamcinolone ointment (KENALOG) 0.5 % Apply 1 application topically 2 (two) times daily. 30 g 0  . triamterene-hydrochlorothiazide (MAXZIDE-25) 37.5-25 MG tablet Take 1 tablet by mouth daily. 90 tablet 1  . Vitamin D, Ergocalciferol, 2000 units CAPS Take 1 capsule by mouth daily after breakfast. 90 capsule 1   No current facility-administered medications for this visit.      ALLERGIES: Shellfish allergy and Tylox [oxycodone-acetaminophen]  Family History  Problem Relation Age  of Onset  . Hypertension Father   . Depression Father   . Diabetes Father   . Alcohol abuse Father   . Colonic polyp Father   . Diabetes Sister   . Diabetes Brother   . Hypertension Mother   . Arthritis Mother   . Pulmonary fibrosis Mother   . Hypertension Sister   . Cancer Paternal Grandmother        breast  . Heart attack Paternal Grandmother   . Allergic rhinitis Neg Hx   . Angioedema Neg Hx   . Asthma Neg Hx   . Eczema Neg Hx   . Immunodeficiency Neg Hx   . Urticaria Neg Hx     Social History   Socioeconomic History  . Marital status: Married    Spouse name: Not on file  . Number of children: Not on file  . Years of education: 74  . Highest education level: Not on file  Occupational History  . Occupation: MEDICAL  ASSISTANT    Employer: [employer]  . Financial resource strain: Not on file  . Food insecurity:    Worry: Not on file    Inability: Not on file  . Transportation needs:    Medical: Not on file    Non-medical: Not on file  Tobacco Use  . Smoking status: Never Smoker  . Smokeless tobacco: Never Used  Substance and Sexual Activity  . Alcohol use: Yes    Alcohol/week: 1.0 standard drinks    Types: 1 Standard drinks or equivalent per week    Comment: occasional  . Drug use: No  . Sexual activity: Yes    Birth control/protection: Surgical    Comment: Hysterectomy  Lifestyle  . Physical activity:    Days per week: Not on file    Minutes per session: Not on file  . Stress: Not on file  Relationships  . Social connections:    Talks on phone: Not on file    Gets together: Not on file    Attends religious service: Not on file    Active member of club or organization: Not on file    Attends meetings of clubs or organizations: Not on file    Relationship status: Not on file  . Intimate partner violence:    Fear of current or ex partner: Not on file    Emotionally abused: Not on file    Physically abused: Not on file    Forced sexual activity: Not on file  Other Topics Concern  . Not on file  Social History Narrative   Regular exercise-no   Caffeine Use-yes    Review of Systems  Constitutional: Negative.   HENT: Negative.   Eyes: Negative.   Respiratory: Negative.   Cardiovascular: Negative.   Gastrointestinal: Negative.   Endocrine: Negative.   Genitourinary: Positive for pelvic pain and vaginal discharge.  Musculoskeletal: Negative.   Skin: Negative.   Allergic/Immunologic: Negative.   Neurological: Negative.   Hematological: Negative.   Psychiatric/Behavioral: Negative.   All other systems reviewed and are negative.   PHYSICAL EXAMINATION:    BP (!) 110/58   Pulse 78   Wt 193 lb (87.5 kg)   LMP 01/18/2012 (Exact Date)   BMI 31.15 kg/m      General appearance: alert, cooperative and appears stated age   Pelvic: External genitalia:  no lesions              Urethra:  normal appearing urethra with no masses, tenderness or  lesions              Bartholins and Skenes: normal                 Vagina: normal appearing vagina with normal color and white clumpy discharge, no lesions              Cervix: no lesions                Bimanual Exam:  Uterus:  normal size, contour, position, consistency, mobility, non-tender              Adnexa: no mass, fullness, tenderness             Chaperone was present for exam.  ASSESSMENT  Vaginitis.   Hx recurrent BV.  PLAN  Affirm.  If has BV, will treat with Flagyl 500 mg po bid x 7 days.  She will then return for repeat testing 48 - 72 hours after her last dose.  If negative, will do 4 -6 months of Metrogel twice weekly.  FU prn.   An After Visit Summary was printed and given to the patient.  _15____ minutes face to face time of which over 50% was spent in counseling.

## 2018-02-08 ENCOUNTER — Telehealth: Payer: Self-pay | Admitting: Obstetrics and Gynecology

## 2018-02-08 ENCOUNTER — Encounter: Payer: Self-pay | Admitting: Obstetrics and Gynecology

## 2018-02-08 LAB — VAGINITIS/VAGINOSIS, DNA PROBE
CANDIDA SPECIES: NEGATIVE
GARDNERELLA VAGINALIS: POSITIVE — AB
Trichomonas vaginosis: NEGATIVE

## 2018-02-08 MED ORDER — METRONIDAZOLE 500 MG PO TABS: 500.0000 mg | ORAL_TABLET | Freq: Two times a day (BID) | ORAL | 0 refills | Status: DC

## 2018-02-08 NOTE — Telephone Encounter (Signed)
Spoke with patient, advised as seen below per Dr. Quincy Simmonds. Rx for flagyl PO to verified pharmacy. Patient states she will pick up RX today and start tonight, should complete last dose morning of 10/8. OV scheduled for recheck on 10/10 at 3:30pm with Dr. Quincy Simmonds. Patient verbalizes understanding and is agreeable. Encounter closed.

## 2018-02-08 NOTE — Telephone Encounter (Signed)
-----   Message from Nunzio Cobbs, MD sent at 02/08/2018  2:26 PM EDT ----- Please inform of Affirm result showing bacterial vaginosis. She may treat with Flagyl 500 mg po bid for 7 days. Please send Rx to pharmacy of choice. ETOH precautions.   She needs to return to the office 48 - 72 hours after her last dose of Flagyl and have repeat vaginitis testing done.  If it is negative then, she can do a long term treatment with Metrogel for 4 - 6 months.

## 2018-02-08 NOTE — Telephone Encounter (Signed)
Patient sent the following message through [facility]. Routing to triage to assist patient with request.   Hello any update on my culture?

## 2018-02-17 ENCOUNTER — Other Ambulatory Visit: Payer: Self-pay

## 2018-02-17 ENCOUNTER — Ambulatory Visit: Payer: BLUE CROSS/BLUE SHIELD | Admitting: Obstetrics and Gynecology

## 2018-02-17 ENCOUNTER — Encounter: Payer: Self-pay | Admitting: Obstetrics and Gynecology

## 2018-02-17 VITALS — BP 118/70 | HR 76 | Ht 66.0 in | Wt 189.6 lb

## 2018-02-17 DIAGNOSIS — N760 Acute vaginitis: Secondary | ICD-10-CM

## 2018-02-17 NOTE — Progress Notes (Signed)
GYNECOLOGY  VISIT   HPI: 47 y.o.   Married  [place] American  female   [phone] with Patient's last menstrual period was 01/18/2012 (exact date).--Hysterectomy  here for retest for bacterial vaginitis.    Seen 02/07/18 and had testing positive for BV.  She treated with Flagyl.  Last dosage was 48 hours ago.  Used the Metrogel in the past and did not always get a good response.   Has vaginal spotting from time to time.  Not related to sexual activity.   GYNECOLOGIC HISTORY: Patient's last menstrual period was 01/18/2012 (exact date). Contraception:  OCPs Menopausal hormone therapy:  none Last mammogram:   10/15/17 BI-RADS CATEGORY 1: Negative. Last pap smear: Last pap smear:   07/28/2016 normal        OB History    Gravida  3   Para  2   Term  2   Preterm  0   AB  1   Living  2     SAB  1   TAB  0   Ectopic  0   Multiple  0   Live Births  2              Patient Active Problem List   Diagnosis Date Noted  . Chronic right-sided low back pain without sciatica 12/13/2017  . Vitamin D insufficiency 07/27/2017  . Seasonal allergic rhinitis 07/27/2017  . Acute intractable headache 06/25/2017  . Gallop rhythm 06/25/2017  . Generalized anxiety disorder 04/10/2016  . Hyperglycemia 04/10/2016  . GERD (gastroesophageal reflux disease) 04/10/2016  . Environmental allergies 06/17/2012  . S/p Abdominal Supracervical Hysterectomy, Left Salpingoohorectomy, Right Salpingectomy on 01/18/12 01/18/2012  . HTN (hypertension) 10/29/2011    Past Medical History:  Diagnosis Date  . Anemia    iron supp  . Anxiety   . Depression   . Fibroids   . GERD (gastroesophageal reflux disease)    omeprazole  . Headache(784.0)   . Hypertension 2012   Maxide  . S/P LEEP 1992  . Urticaria     Past Surgical History:  Procedure Laterality Date  . LEEP  1998   Abnormal pap  . SUPRACERVICAL ABDOMINAL HYSTERECTOMY  01/18/2012   Procedure: HYSTERECTOMY SUPRACERVICAL ABDOMINAL with  LSO;  Surgeon: Osborne Oman, MD;  Location: [facility];  Service: Gynecology;  Laterality: N/A;  . TUBAL LIGATION  1997  . WISDOM TOOTH EXTRACTION      Current Outpatient Medications  Medication Sig Dispense Refill  . amLODipine (NORVASC) 5 MG tablet Take 1 tablet (5 mg total) by mouth daily. 90 tablet 1  . cetirizine (ZYRTEC) 10 MG tablet Take 10 mg by mouth daily.    . fluticasone (FLONASE) 50 MCG/ACT nasal spray Place 1 spray into both nostrils daily. 16 g 5  . Ibuprofen-Famotidine 800-26.6 MG TABS Take 1 tablet by mouth 3 (three) times daily. 90 tablet 3  . omeprazole (PRILOSEC) 20 MG capsule Take 1 capsule (20 mg total) by mouth daily. 90 capsule 1  . saccharomyces boulardii (FLORASTOR) 250 MG capsule Take 250 mg by mouth 2 (two) times daily.    Marland Kitchen triamcinolone ointment (KENALOG) 0.5 % Apply 1 application topically 2 (two) times daily. 30 g 0  . triamterene-hydrochlorothiazide (MAXZIDE-25) 37.5-25 MG tablet Take 1 tablet by mouth daily. 90 tablet 1  . Vitamin D, Ergocalciferol, 2000 units CAPS Take 1 capsule by mouth daily after breakfast. 90 capsule 1   No current facility-administered medications for this visit.      ALLERGIES:  Shellfish allergy and Tylox [oxycodone-acetaminophen]  Family History  Problem Relation Age of Onset  . Hypertension Father   . Depression Father   . Diabetes Father   . Alcohol abuse Father   . Colonic polyp Father   . Diabetes Sister   . Diabetes Brother   . Hypertension Mother   . Arthritis Mother   . Pulmonary fibrosis Mother   . Hypertension Sister   . Cancer Paternal Grandmother        breast  . Heart attack Paternal Grandmother   . Allergic rhinitis Neg Hx   . Angioedema Neg Hx   . Asthma Neg Hx   . Eczema Neg Hx   . Immunodeficiency Neg Hx   . Urticaria Neg Hx     Social History   Socioeconomic History  . Marital status: Married    Spouse name: Not on file  . Number of children: Not on file  . Years of education: 13  .  Highest education level: Not on file  Occupational History  . Occupation: MEDICAL ASSISTANT    Employer: [employer]  . Financial resource strain: Not on file  . Food insecurity:    Worry: Not on file    Inability: Not on file  . Transportation needs:    Medical: Not on file    Non-medical: Not on file  Tobacco Use  . Smoking status: Never Smoker  . Smokeless tobacco: Never Used  Substance and Sexual Activity  . Alcohol use: Yes    Alcohol/week: 1.0 standard drinks    Types: 1 Standard drinks or equivalent per week    Comment: occasional  . Drug use: No  . Sexual activity: Yes    Birth control/protection: Surgical    Comment: Hysterectomy  Lifestyle  . Physical activity:    Days per week: Not on file    Minutes per session: Not on file  . Stress: Not on file  Relationships  . Social connections:    Talks on phone: Not on file    Gets together: Not on file    Attends religious service: Not on file    Active member of club or organization: Not on file    Attends meetings of clubs or organizations: Not on file    Relationship status: Not on file  . Intimate partner violence:    Fear of current or ex partner: Not on file    Emotionally abused: Not on file    Physically abused: Not on file    Forced sexual activity: Not on file  Other Topics Concern  . Not on file  Social History Narrative   Regular exercise-no   Caffeine Use-yes    Review of Systems  Constitutional: Negative.   HENT: Negative.   Eyes: Negative.   Respiratory: Negative.   Cardiovascular: Negative.   Gastrointestinal: Negative.   Endocrine: Negative.   Genitourinary: Negative.   Musculoskeletal: Negative.   Skin: Negative.   Allergic/Immunologic: Negative.   Neurological: Negative.   Hematological: Negative.   Psychiatric/Behavioral: Negative.     PHYSICAL EXAMINATION:    BP 118/70 (BP Location: Right Arm, Patient Position: Sitting, Cuff Size: Large)   Pulse 76   Ht  5\' 6"  (1.676 m)   Wt 189 lb 9.6 oz (86 kg)   LMP 01/18/2012 (Exact Date)   BMI 30.60 kg/m     General appearance: alert, cooperative and appears stated age  Pelvic: External genitalia:  no lesions  Urethra:  normal appearing urethra with no masses, tenderness or lesions              Bartholins and Skenes: normal                 Vagina: normal appearing vagina with normal color and discharge, no lesions              Cervix: no lesions                Bimanual Exam:  Uterus:  normal size, contour, position, consistency, mobility, non-tender              Adnexa: no mass, fullness, tenderness              Chaperone was present for exam.  ASSESSMENT  Status post supracervical hysterectomy, LSO, right salpingectomy for fibroids.  Right ovary remains.  Recurrent BV.   PLAN  We discussed periodic spotting with supracervical hysterectomy and retention of at least one ovary.  Affirm done.  We discussed regimens for acute BV versus suppressive regimens.  Our goal is to start Metrogel pv twice weekly if her Affirm testing is negative.    An After Visit Summary was printed and given to the patient.  ___15___ minutes face to face time of which over 50% was spent in counseling.

## 2018-02-18 ENCOUNTER — Other Ambulatory Visit: Payer: Self-pay | Admitting: *Deleted

## 2018-02-18 LAB — VAGINITIS/VAGINOSIS, DNA PROBE
Candida Species: NEGATIVE
GARDNERELLA VAGINALIS: NEGATIVE
TRICHOMONAS VAG: NEGATIVE

## 2018-02-18 MED ORDER — METRONIDAZOLE 0.75 % VA GEL: 1.0000 | VAGINAL | 4 refills | Status: DC

## 2018-03-24 DIAGNOSIS — H04123 Dry eye syndrome of bilateral lacrimal glands: Secondary | ICD-10-CM | POA: Diagnosis not present

## 2018-04-06 ENCOUNTER — Other Ambulatory Visit: Payer: Self-pay

## 2018-04-06 ENCOUNTER — Encounter: Payer: Self-pay | Admitting: Obstetrics and Gynecology

## 2018-04-06 ENCOUNTER — Ambulatory Visit: Payer: 59 | Admitting: Obstetrics and Gynecology

## 2018-04-06 VITALS — BP 130/82 | HR 76 | Ht 66.0 in | Wt 190.6 lb

## 2018-04-06 DIAGNOSIS — K645 Perianal venous thrombosis: Secondary | ICD-10-CM | POA: Diagnosis not present

## 2018-04-06 HISTORY — PX: OTHER SURGICAL HISTORY: SHX169

## 2018-04-06 NOTE — Progress Notes (Signed)
GYNECOLOGY  VISIT   HPI: 47 y.o.   Married  [place] American  female   [phone] with Patient's last menstrual period was 01/18/2012 (exact date).   here for possible hemorrhoid.   Felt rectal sensation like she was having fecal incontinence.  Now having pain with wiping.  Thinks she had a cyst.  It is painful.  Feels irritation.  No bleeding.  Stool soft but not diarrhea.  Stomach feels upset at times.  No nausea or vomiting.   On suppressive regimen for BV.  Using Metrogel twice weekly.  GYNECOLOGIC HISTORY: Patient's last menstrual period was 01/18/2012 (exact date). Contraception:  OCPs Menopausal hormone therapy:  none Last mammogram: 06/07/19BI-RADS CATEGORY 1: Negative. Last pap smear:  07-28-16 Normal        OB History    Gravida  3   Para  2   Term  2   Preterm  0   AB  1   Living  2     SAB  1   TAB  0   Ectopic  0   Multiple  0   Live Births  2              Patient Active Problem List   Diagnosis Date Noted  . Chronic right-sided low back pain without sciatica 12/13/2017  . Vitamin D insufficiency 07/27/2017  . Seasonal allergic rhinitis 07/27/2017  . Acute intractable headache 06/25/2017  . Gallop rhythm 06/25/2017  . Generalized anxiety disorder 04/10/2016  . Hyperglycemia 04/10/2016  . GERD (gastroesophageal reflux disease) 04/10/2016  . Environmental allergies 06/17/2012  . S/p Abdominal Supracervical Hysterectomy, Left Salpingoohorectomy, Right Salpingectomy on 01/18/12 01/18/2012  . HTN (hypertension) 10/29/2011    Past Medical History:  Diagnosis Date  . Anemia    iron supp  . Anxiety   . Depression   . Fibroids   . GERD (gastroesophageal reflux disease)    omeprazole  . Headache(784.0)   . Hypertension 2012   Maxide  . S/P LEEP 1992  . Urticaria     Past Surgical History:  Procedure Laterality Date  . LEEP  1998   Abnormal pap  . SUPRACERVICAL ABDOMINAL HYSTERECTOMY  01/18/2012   Procedure: HYSTERECTOMY  SUPRACERVICAL ABDOMINAL with LSO;  Surgeon: Osborne Oman, MD;  Location: [facility];  Service: Gynecology;  Laterality: N/A;  . TUBAL LIGATION  1997  . WISDOM TOOTH EXTRACTION      Current Outpatient Medications  Medication Sig Dispense Refill  . amLODipine (NORVASC) 5 MG tablet Take 1 tablet (5 mg total) by mouth daily. 90 tablet 1  . cetirizine (ZYRTEC) 10 MG tablet Take 10 mg by mouth daily.    . fluticasone (FLONASE) 50 MCG/ACT nasal spray Place 1 spray into both nostrils daily. 16 g 5  . Ibuprofen-Famotidine 800-26.6 MG TABS Take 1 tablet by mouth 3 (three) times daily. 90 tablet 3  . metroNIDAZOLE (METROGEL) 0.75 % vaginal gel Place 1 Applicatorful vaginally 2 (two) times a week. 70 g 4  . omeprazole (PRILOSEC) 20 MG capsule Take 1 capsule (20 mg total) by mouth daily. 90 capsule 1  . saccharomyces boulardii (FLORASTOR) 250 MG capsule Take 250 mg by mouth 2 (two) times daily.    Marland Kitchen triamcinolone ointment (KENALOG) 0.5 % Apply 1 application topically 2 (two) times daily. 30 g 0  . triamterene-hydrochlorothiazide (MAXZIDE-25) 37.5-25 MG tablet Take 1 tablet by mouth daily. 90 tablet 1  . Vitamin D, Ergocalciferol, 2000 units CAPS Take 1 capsule by mouth daily  after breakfast. 90 capsule 1   No current facility-administered medications for this visit.      ALLERGIES: Shellfish allergy and Tylox [oxycodone-acetaminophen]  Family History  Problem Relation Age of Onset  . Hypertension Father   . Depression Father   . Diabetes Father   . Alcohol abuse Father   . Colonic polyp Father   . Diabetes Sister   . Diabetes Brother   . Hypertension Mother   . Arthritis Mother   . Pulmonary fibrosis Mother   . Hypertension Sister   . Cancer Paternal Grandmother        breast  . Heart attack Paternal Grandmother   . Allergic rhinitis Neg Hx   . Angioedema Neg Hx   . Asthma Neg Hx   . Eczema Neg Hx   . Immunodeficiency Neg Hx   . Urticaria Neg Hx     Social History   Socioeconomic  History  . Marital status: Married    Spouse name: Not on file  . Number of children: Not on file  . Years of education: 40  . Highest education level: Not on file  Occupational History  . Occupation: MEDICAL ASSISTANT    Employer: [employer]  . Financial resource strain: Not on file  . Food insecurity:    Worry: Not on file    Inability: Not on file  . Transportation needs:    Medical: Not on file    Non-medical: Not on file  Tobacco Use  . Smoking status: Never Smoker  . Smokeless tobacco: Never Used  Substance and Sexual Activity  . Alcohol use: Yes    Alcohol/week: 1.0 standard drinks    Types: 1 Standard drinks or equivalent per week    Comment: occasional  . Drug use: No  . Sexual activity: Yes    Birth control/protection: Surgical    Comment: Hysterectomy  Lifestyle  . Physical activity:    Days per week: Not on file    Minutes per session: Not on file  . Stress: Not on file  Relationships  . Social connections:    Talks on phone: Not on file    Gets together: Not on file    Attends religious service: Not on file    Active member of club or organization: Not on file    Attends meetings of clubs or organizations: Not on file    Relationship status: Not on file  . Intimate partner violence:    Fear of current or ex partner: Not on file    Emotionally abused: Not on file    Physically abused: Not on file    Forced sexual activity: Not on file  Other Topics Concern  . Not on file  Social History Narrative   Regular exercise-no   Caffeine Use-yes    Review of Systems  Gastrointestinal: Positive for rectal pain.       Possible hemorrhoid  All other systems reviewed and are negative.   PHYSICAL EXAMINATION:    BP 130/82 (BP Location: Right Arm, Patient Position: Sitting, Cuff Size: Large)   Pulse 76   Ht 5\' 6"  (1.676 m)   Wt 190 lb 9.6 oz (86.5 kg)   LMP 01/18/2012 (Exact Date)   BMI 30.76 kg/m     General appearance: alert,  cooperative and appears stated age                Rectal exam:  No masses internally.  Anus:  normal sphincter tone,  1 cm thrombosed hemorrhoid at left anal opening.   I and D thrombosed external hemorrhoid   Consent for procedure.  Sterile prep with betadine.  Local 1% lidocaine - lot [id number], exp 1/23.  Incision with scalpel and clot expressed.  Minimal EBL.  No complications.   Chaperone was present for exam.  ASSESSMENT  Status post supracervical hysterectomy, LSO, right salpingectomy for fibroids.  Right ovary remains.  Recurrent BV.  Thrombosed hemorrhoid.   PLAN  Post procedure instructions given.  Cleanse with soap and water.  Avoid constipation.  FU prn.    An After Visit Summary was printed and given to the patient.  __15____ minutes face to face time of which over 50% was spent in counseling.

## 2018-04-06 NOTE — Patient Instructions (Signed)
Hemorrhoids Hemorrhoids are swollen veins in and around the rectum or anus. There are two types of hemorrhoids:  Internal hemorrhoids. These occur in the veins that are just inside the rectum. They may poke through to the outside and become irritated and painful.  External hemorrhoids. These occur in the veins that are outside of the anus and can be felt as a painful swelling or hard lump near the anus.  Most hemorrhoids do not cause serious problems, and they can be managed with home treatments such as diet and lifestyle changes. If home treatments do not help your symptoms, procedures can be done to shrink or remove the hemorrhoids. What are the causes? This condition is caused by increased pressure in the anal area. This pressure may result from various things, including:  Constipation.  Straining to have a bowel movement.  Diarrhea.  Pregnancy.  Obesity.  Sitting for long periods of time.  Heavy lifting or other activity that causes you to strain.  Anal sex.  What are the signs or symptoms? Symptoms of this condition include:  Pain.  Anal itching or irritation.  Rectal bleeding.  Leakage of stool (feces).  Anal swelling.  One or more lumps around the anus.  How is this diagnosed? This condition can often be diagnosed through a visual exam. Other exams or tests may also be done, such as:  Examination of the rectal area with a gloved hand (digital rectal exam).  Examination of the anal canal using a small tube (anoscope).  A blood test, if you have lost a significant amount of blood.  A test to look inside the colon (sigmoidoscopy or colonoscopy).  How is this treated? This condition can usually be treated at home. However, various procedures may be done if dietary changes, lifestyle changes, and other home treatments do not help your symptoms. These procedures can help make the hemorrhoids smaller or remove them completely. Some of these procedures involve  surgery, and others do not. Common procedures include:  Rubber band ligation. Rubber bands are placed at the base of the hemorrhoids to cut off the blood supply to them.  Sclerotherapy. Medicine is injected into the hemorrhoids to shrink them.  Infrared coagulation. A type of light energy is used to get rid of the hemorrhoids.  Hemorrhoidectomy surgery. The hemorrhoids are surgically removed, and the veins that supply them are tied off.  Stapled hemorrhoidopexy surgery. A circular stapling device is used to remove the hemorrhoids and use staples to cut off the blood supply to them.  Follow these instructions at home: Eating and drinking  Eat foods that have a lot of fiber in them, such as whole grains, beans, nuts, fruits, and vegetables. Ask your health care provider about taking products that have added fiber (fiber supplements).  Drink enough fluid to keep your urine clear or pale yellow. Managing pain and swelling  Take warm sitz baths for 20 minutes, 3-4 times a day to ease pain and discomfort.  If directed, apply ice to the affected area. Using ice packs between sitz baths may be helpful. ? Put ice in a plastic bag. ? Place a towel between your skin and the bag. ? Leave the ice on for 20 minutes, 2-3 times a day. General instructions  Take over-the-counter and prescription medicines only as told by your health care provider.  Use medicated creams or suppositories as told.  Exercise regularly.  Go to the bathroom when you have the urge to have a bowel movement. Do not wait.    Avoid straining to have bowel movements.  Keep the anal area dry and clean. Use wet toilet paper or moist towelettes after a bowel movement.  Do not sit on the toilet for long periods of time. This increases blood pooling and pain. Contact a health care provider if:  You have increasing pain and swelling that are not controlled by treatment or medicine.  You have uncontrolled bleeding.  You  have difficulty having a bowel movement, or you are unable to have a bowel movement.  You have pain or inflammation outside the area of the hemorrhoids. This information is not intended to replace advice given to you by your health care provider. Make sure you discuss any questions you have with your health care provider. Document Released: 04/24/2000 Document Revised: 09/25/2015 Document Reviewed: 01/09/2015 Elsevier Interactive Patient Education  2018 Elsevier Inc.  

## 2018-04-08 ENCOUNTER — Encounter: Payer: Self-pay | Admitting: Obstetrics and Gynecology

## 2018-05-06 ENCOUNTER — Ambulatory Visit: Payer: 59 | Admitting: Nurse Practitioner

## 2018-05-06 ENCOUNTER — Encounter: Payer: Self-pay | Admitting: Nurse Practitioner

## 2018-05-06 VITALS — BP 130/82 | HR 83 | Temp 98.2°F | Ht 66.0 in | Wt 191.8 lb

## 2018-05-06 DIAGNOSIS — M778 Other enthesopathies, not elsewhere classified: Secondary | ICD-10-CM

## 2018-05-06 MED ORDER — PREDNISONE 10 MG (21) PO TBPK: ORAL_TABLET | ORAL | 0 refills | Status: DC

## 2018-05-06 NOTE — Progress Notes (Signed)
Subjective:  Patient ID: Abigail [name], female    DOB: 03-27-1971  Age: 47 y.o. MRN: [id number]  CC: Hand Pain (both hands in pain, throbbing-- when open and closing and gripping/ 4th episode this year/taking ibuprofen helps ease some pain)   Wrist Pain   The pain is present in the left wrist and right wrist. This is a recurrent problem. The current episode started more than 1 month ago. There has been no history of extremity trauma. The problem occurs intermittently. The problem has been waxing and waning. The quality of the pain is described as aching and dull. Associated symptoms include an inability to bear weight and tingling. Pertinent negatives include no fever, itching, joint locking, joint swelling, limited range of motion, numbness or stiffness. The symptoms are aggravated by activity. She has tried NSAIDS for the symptoms. Family history does not include gout or rheumatoid arthritis. There is no history of diabetes, gout, osteoarthritis or rheumatoid arthritis.   Reviewed past Medical, Social and Family history today.  Outpatient Medications Prior to Visit  Medication Sig Dispense Refill  . amLODipine (NORVASC) 5 MG tablet Take 1 tablet (5 mg total) by mouth daily. 90 tablet 1  . cetirizine (ZYRTEC) 10 MG tablet Take 10 mg by mouth daily.    . fluticasone (FLONASE) 50 MCG/ACT nasal spray Place 1 spray into both nostrils daily. 16 g 5  . Ibuprofen-Famotidine 800-26.6 MG TABS Take 1 tablet by mouth 3 (three) times daily. 90 tablet 3  . metroNIDAZOLE (METROGEL) 0.75 % vaginal gel Place 1 Applicatorful vaginally 2 (two) times a week. 70 g 4  . omeprazole (PRILOSEC) 20 MG capsule Take 1 capsule (20 mg total) by mouth daily. 90 capsule 1  . saccharomyces boulardii (FLORASTOR) 250 MG capsule Take 250 mg by mouth 2 (two) times daily.    Marland Kitchen triamcinolone ointment (KENALOG) 0.5 % Apply 1 application topically 2 (two) times daily. 30 g 0  . triamterene-hydrochlorothiazide (MAXZIDE-25) 37.5-25  MG tablet Take 1 tablet by mouth daily. 90 tablet 1  . Vitamin D, Ergocalciferol, 2000 units CAPS Take 1 capsule by mouth daily after breakfast. 90 capsule 1   No facility-administered medications prior to visit.     ROS See HPI  Objective:  BP 130/82   Pulse 83   Temp 98.2 F (36.8 C) (Oral)   Ht 5\' 6"  (1.676 m)   Wt 191 lb 12.8 oz (87 kg)   LMP 01/18/2012 (Exact Date)   SpO2 97%   BMI 30.96 kg/m   BP Readings from Last 3 Encounters:  05/06/18 130/82  04/06/18 130/82  02/17/18 118/70    Wt Readings from Last 3 Encounters:  05/06/18 191 lb 12.8 oz (87 kg)  04/06/18 190 lb 9.6 oz (86.5 kg)  02/17/18 189 lb 9.6 oz (86 kg)    Physical Exam Vitals signs reviewed.  Musculoskeletal:        General: Tenderness present. No swelling, deformity or signs of injury.     Right wrist: She exhibits tenderness. She exhibits no bony tenderness, no swelling and no crepitus.     Left wrist: She exhibits tenderness. She exhibits no bony tenderness.  Skin:    General: Skin is warm and dry.     Lab Results  Component Value Date   WBC 5.4 07/27/2017   HGB 13.6 07/27/2017   HCT 40.2 07/27/2017   PLT 312.0 07/27/2017   GLUCOSE 92 07/06/2017   CHOL 184 07/27/2017   TRIG 115.0 07/27/2017   HDL 47.30  07/27/2017   LDLCALC 113 (H) 07/27/2017   ALT 23 07/27/2017   AST 34 07/27/2017   NA 136 07/06/2017   K 4.1 07/06/2017   CL 98 07/06/2017   CREATININE 0.90 07/06/2017   BUN 13 07/06/2017   CO2 22 07/06/2017   TSH 0.491 07/06/2017   HGBA1C 6.4 07/27/2017    Mm 3d Screen Breast Bilateral  Result Date: 10/18/2017 CLINICAL DATA:  Screening. EXAM: DIGITAL SCREENING BILATERAL MAMMOGRAM WITH TOMO AND CAD COMPARISON:  Previous exam(s). ACR Breast Density Category b: There are scattered areas of fibroglandular density. FINDINGS: There are no findings suspicious for malignancy. Images were processed with CAD. IMPRESSION: No mammographic evidence of malignancy. A result letter of this  screening mammogram will be mailed directly to the patient. RECOMMENDATION: Screening mammogram in one year. (Code:SM-B-01Y) BI-RADS CATEGORY  1: Negative. Electronically Signed   By: Ammie Ferrier M.D.   On: 10/18/2017 09:34    Assessment & Plan:   [name] was seen today for hand pain.  Diagnoses and all orders for this visit:  Left wrist tendonitis -     predniSONE (STERAPRED UNI-PAK 21 TAB) 10 MG (21) TBPK tablet; As directed on package   I am having Abigail [name] start on predniSONE. I am also having her maintain her cetirizine, saccharomyces boulardii, amLODipine, fluticasone, omeprazole, triamterene-hydrochlorothiazide, Vitamin D (Ergocalciferol), triamcinolone ointment, Ibuprofen-Famotidine, and metroNIDAZOLE.  Meds ordered this encounter  Medications  . predniSONE (STERAPRED UNI-PAK 21 TAB) 10 MG (21) TBPK tablet    Sig: As directed on package    Dispense:  21 tablet    Refill:  0    Order Specific Question:   Supervising Provider    Answer:   MATTHEWS, CODY [[id number]]    Problem List Items Addressed This Visit    None    Visit Diagnoses    Left wrist tendonitis    -  Primary   Relevant Medications   predniSONE (STERAPRED UNI-PAK 21 TAB) 10 MG (21) TBPK tablet       Follow-up: No follow-ups on file.  Wilfred Lacy, NP

## 2018-05-06 NOTE — Patient Instructions (Signed)
Use wrist brace during the day and at bedtime x 1week, then during the day only for another week.  Carpal Tunnel Syndrome  Carpal tunnel syndrome is a condition that causes pain in your hand and arm. The carpal tunnel is a narrow area located on the palm side of your wrist. Repeated wrist motion or certain diseases may cause swelling within the tunnel. This swelling pinches the main nerve in the wrist (median nerve). What are the causes? This condition may be caused by:  Repeated wrist motions.  Wrist injuries.  Arthritis.  A cyst or tumor in the carpal tunnel.  Fluid buildup during pregnancy. Sometimes the cause of this condition is not known. What increases the risk? The following factors may make you more likely to develop this condition:  Having a job, such as being a Research scientist (life sciences), that requires you to repeatedly move your wrist in the same motion.  Being a woman.  Having certain conditions, such as: ? Diabetes. ? Obesity. ? An underactive thyroid (hypothyroidism). ? Kidney failure. What are the signs or symptoms? Symptoms of this condition include:  A tingling feeling in your fingers, especially in your thumb, index, and middle fingers.  Tingling or numbness in your hand.  An aching feeling in your entire arm, especially when your wrist and elbow are bent for a long time.  Wrist pain that goes up your arm to your shoulder.  Pain that goes down into your palm or fingers.  A weak feeling in your hands. You may have trouble grabbing and holding items. Your symptoms may feel worse during the night. How is this diagnosed? This condition is diagnosed with a medical history and physical exam. You may also have tests, including:  Electromyogram (EMG). This test measures electrical signals sent by your nerves into the muscles.  Nerve conduction study. This test measures how well electrical signals pass through your nerves.  Imaging tests, such as X-rays,  ultrasound, and MRI. These tests check for possible causes of your condition. How is this treated? This condition may be treated with:  Lifestyle changes. It is important to stop or change the activity that caused your condition.  Doing exercise and activities to strengthen your muscles and bones (physical therapy).  Learning how to use your hand again after diagnosis (occupational therapy).  Medicines for pain and inflammation. This may include medicine that is injected into your wrist.  A wrist splint.  Surgery. Follow these instructions at home: If you have a splint:  Wear the splint as told by your health care provider. Remove it only as told by your health care provider.  Loosen the splint if your fingers tingle, become numb, or turn cold and blue.  Keep the splint clean.  If the splint is not waterproof: ? Do not let it get wet. ? Cover it with a watertight covering when you take a bath or shower. Managing pain, stiffness, and swelling   If directed, put ice on the painful area: ? If you have a removable splint, remove it as told by your health care provider. ? Put ice in a plastic bag. ? Place a towel between your skin and the bag. ? Leave the ice on for 20 minutes, 2-3 times per day. General instructions  Take over-the-counter and prescription medicines only as told by your health care provider.  Rest your wrist from any activity that may be causing your pain. If your condition is work related, talk with your employer about changes  that can be made, such as getting a wrist pad to use while typing.  Do any exercises as told by your health care provider, physical therapist, or occupational therapist.  Keep all follow-up visits as told by your health care provider. This is important. Contact a health care provider if:  You have new symptoms.  Your pain is not controlled with medicines.  Your symptoms get worse. Get help right away if:  You have severe  numbness or tingling in your wrist or hand. Summary  Carpal tunnel syndrome is a condition that causes pain in your hand and arm.  It is usually caused by repeated wrist motions.  Lifestyle changes and medicines are used to treat carpal tunnel syndrome. Surgery may be recommended.  Follow your health care provider's instructions about wearing a splint, resting from activity, keeping follow-up visits, and calling for help. This information is not intended to replace advice given to you by your health care provider. Make sure you discuss any questions you have with your health care provider. Document Released: 04/24/2000 Document Revised: 09/03/2017 Document Reviewed: 09/03/2017 Elsevier Interactive Patient Education  2019 [employer].

## 2018-07-30 IMAGING — CT CT HEART MORP W/ CTA COR W/ SCORE W/ CA W/CM &/OR W/O CM
4 of 7 series · 8 of 20 positions shown, 9 images · IV contrast (APPLIED)
Comparison: None.

CLINICAL DATA: Chest pain

EXAM:
Cardiac CTA
MEDICATIONS:
Sub lingual nitro. 4mg x 2 and lopressor 5mg IV
TECHNIQUE: The patient was scanned on a Siemens [REDACTED]ice scanner. Gantry
rotation speed was 250 msecs. Collimation was 0.6 mm. A 100 kV
prospective scan was triggered in the ascending thoracic aorta at
35-75% of the R-R interval. Average HR during the scan was 60 bpm.
The 3D data set was interpreted on a dedicated work station using
MPR, MIP and VRT modes. A total of 80cc of contrast was used.

[Series 6: best diast 70 % · axial · 0.32mm/px · z∈[+1120,+1161]mm · 2 of 308 slices shown, 3 images]
[im 103/308  vessel]
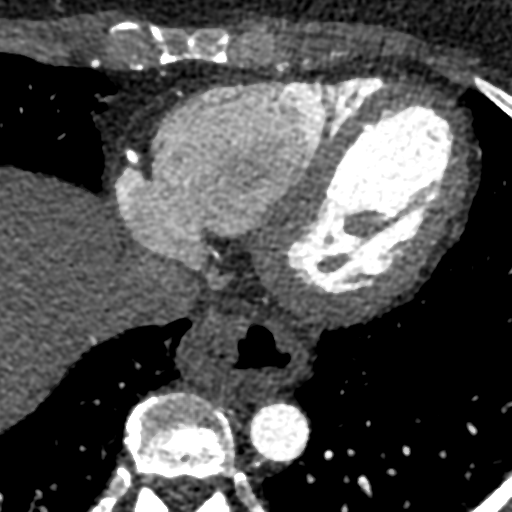
[im 103/308  lung]
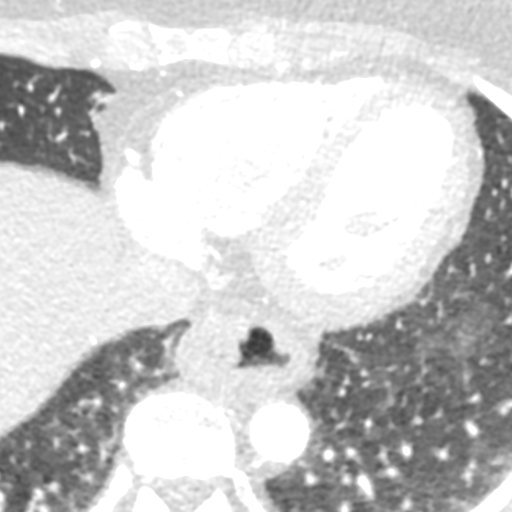
[im 205/308  vessel]
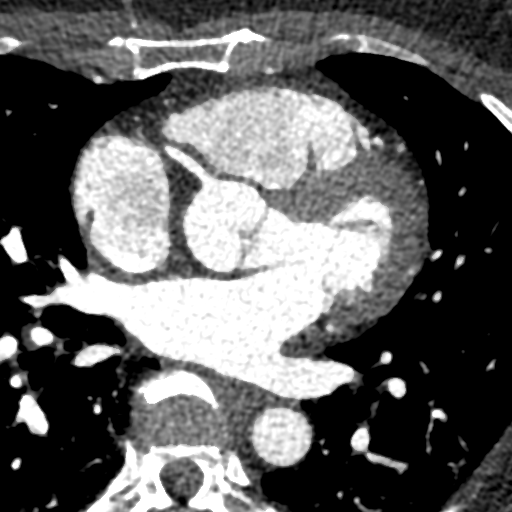

[Series 7: best syst 37 % · axial · 0.32mm/px · z∈[+1120,+1161]mm · 2 of 308 slices shown]
[im 103/308  vessel]
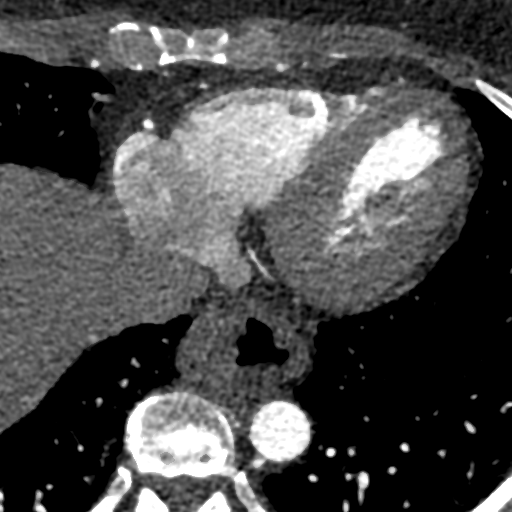
[im 205/308  vessel]
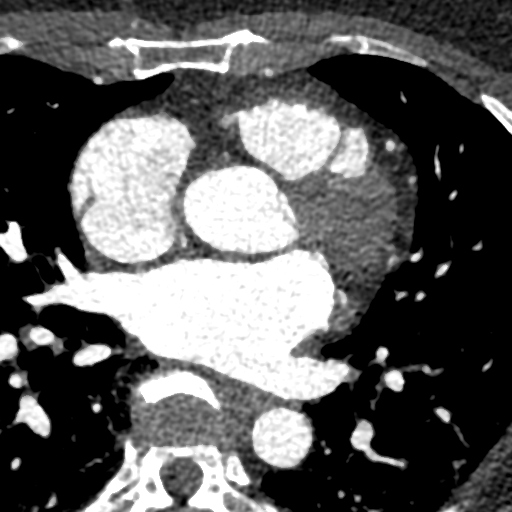

[Series 8: ts diast sharp 70 % · axial · 0.32mm/px · z∈[+1120,+1161]mm · 2 of 308 slices shown]
[im 103/308  lung]
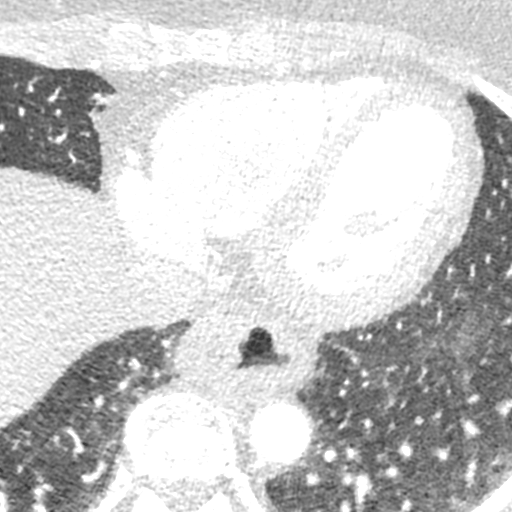
[im 205/308  lung]
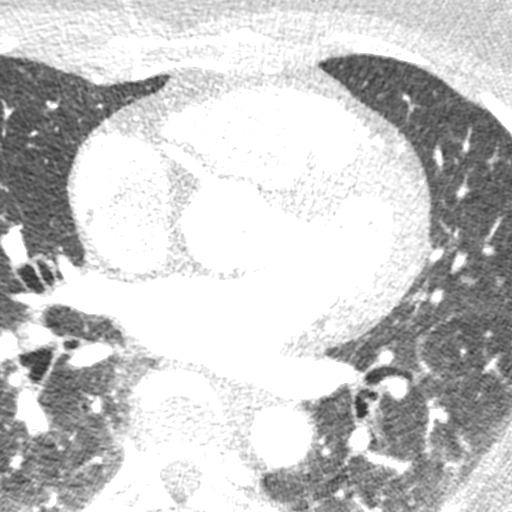

[Series 9: ts syst sharp 37 % · axial · 0.32mm/px · z∈[+1120,+1161]mm · 2 of 308 slices shown]
[im 103/308  lung]
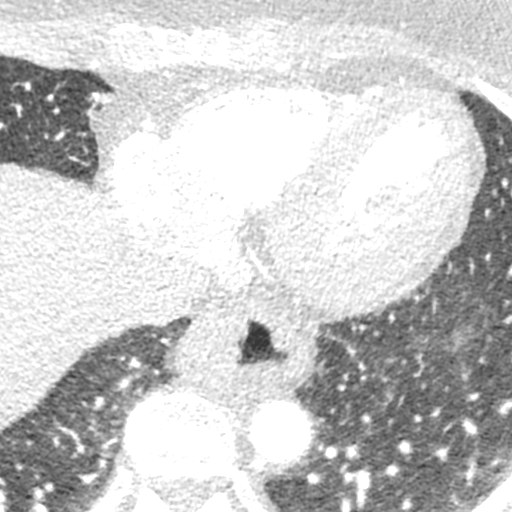
[im 205/308  lung]
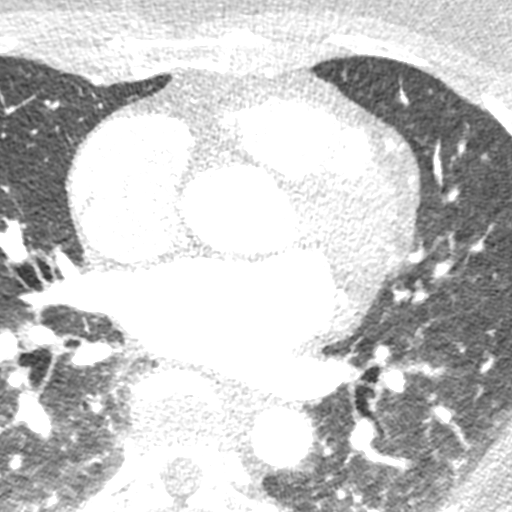

[8 of 20 positions shown; findings below may reference images not displayed]

FINDINGS: Non-cardiac: See separate report from [REDACTED].

Calcium Score: 0 Agatston units.

Coronary Arteries: Right dominant with no anomalies

LM: No plaque or stenosis.

LAD system: No plaque or stenosis.

Circumflex system: No plaque or stenosis.

RCA system: No plaque or stenosis
IMPRESSION: 1. Coronary artery calcium score 0 Agatston units, suggesting low
risk for future cardiac events.

2.  No plaque or stenosis noted in the coronary arteries.

Abo Amer Doaa

EXAM:
OVER-READ INTERPRETATION  CT CHEST

The following report is an over-read performed by radiologist Dr.
Arcy Cham [REDACTED] on 08/06/2017. This over-read
does not include interpretation of cardiac or coronary anatomy or
pathology. The coronary CTA interpretation by the cardiologist is
attached.
FINDINGS: Vascular: Heart is normal size.  Aorta is normal caliber.

Mediastinum/Nodes: No adenopathy in the lower mediastinum or hila.
Small to moderate-sized hiatal hernia.

Lungs/Pleura: Visualized lungs are clear.  No effusions.

Upper Abdomen: Imaging into the upper abdomen shows no acute
findings.

Musculoskeletal: Chest wall soft tissues are unremarkable. No acute
bony abnormality.
IMPRESSION: Small to moderate-sized hiatal hernia.

No acute extra cardiac abnormality.

## 2018-08-05 ENCOUNTER — Ambulatory Visit: Payer: BLUE CROSS/BLUE SHIELD | Admitting: Obstetrics and Gynecology

## 2018-08-20 ENCOUNTER — Encounter: Payer: Self-pay | Admitting: Nurse Practitioner

## 2018-08-22 ENCOUNTER — Telehealth: Payer: Self-pay | Admitting: Nurse Practitioner

## 2018-08-22 NOTE — Telephone Encounter (Signed)
Patient called to leave vital signs:  Pulse - 76 Temperature - 97.8 Blood Pressure - 143/80  These were taken at 1:30pm today.

## 2018-08-23 ENCOUNTER — Encounter: Payer: Self-pay | Admitting: Nurse Practitioner

## 2018-08-23 ENCOUNTER — Ambulatory Visit (INDEPENDENT_AMBULATORY_CARE_PROVIDER_SITE_OTHER): Payer: 59 | Admitting: Nurse Practitioner

## 2018-08-23 VITALS — BP 143/80 | HR 76 | Temp 97.8°F | Ht 66.0 in

## 2018-08-23 DIAGNOSIS — J302 Other seasonal allergic rhinitis: Secondary | ICD-10-CM

## 2018-08-23 DIAGNOSIS — M545 Low back pain, unspecified: Secondary | ICD-10-CM

## 2018-08-23 DIAGNOSIS — E559 Vitamin D deficiency, unspecified: Secondary | ICD-10-CM

## 2018-08-23 DIAGNOSIS — K219 Gastro-esophageal reflux disease without esophagitis: Secondary | ICD-10-CM | POA: Diagnosis not present

## 2018-08-23 DIAGNOSIS — G8929 Other chronic pain: Secondary | ICD-10-CM

## 2018-08-23 DIAGNOSIS — Z136 Encounter for screening for cardiovascular disorders: Secondary | ICD-10-CM

## 2018-08-23 DIAGNOSIS — R739 Hyperglycemia, unspecified: Secondary | ICD-10-CM | POA: Diagnosis not present

## 2018-08-23 DIAGNOSIS — I10 Essential (primary) hypertension: Secondary | ICD-10-CM

## 2018-08-23 DIAGNOSIS — Z13220 Encounter for screening for lipoid disorders: Secondary | ICD-10-CM

## 2018-08-23 MED ORDER — FLUTICASONE PROPIONATE 50 MCG/ACT NA SUSP: 1.0000 | Freq: Every day | NASAL | 5 refills | Status: DC

## 2018-08-23 MED ORDER — AMLODIPINE BESYLATE 5 MG PO TABS: 5.0000 mg | ORAL_TABLET | Freq: Every day | ORAL | 1 refills | Status: DC

## 2018-08-23 MED ORDER — OMEPRAZOLE 20 MG PO CPDR: 20.0000 mg | DELAYED_RELEASE_CAPSULE | Freq: Every day | ORAL | 1 refills | Status: DC

## 2018-08-23 MED ORDER — IBUPROFEN-FAMOTIDINE 800-26.6 MG PO TABS: 1.0000 | ORAL_TABLET | Freq: Three times a day (TID) | ORAL | 3 refills | Status: DC

## 2018-08-23 NOTE — Telephone Encounter (Signed)
Noted  

## 2018-08-23 NOTE — Progress Notes (Signed)
Virtual Visit via Video Note  I connected with Abigail [name] on 08/23/18 at  8:00 AM EDT by a video enabled telemedicine application and verified that I am speaking with the correct person using two identifiers.   I discussed the limitations of evaluation and management by telemedicine and the availability of in person appointments. The patient expressed understanding and agreed to proceed.  CC: 6 mo fu DM and HTN--refills--lower back pain,ibuprofen consult.   History of Present Illness:  HTN:  uncontrolled Waxing and waning due to inconsistency with maxzide. Reports she is consistent with amlodipine use Unable to to take maxzide consistency due to need to use bathroom while at work. Reports home BP ranges from 150s/90s to 120s/80s  Chronic back pain: intermittent, stable Evaluated by sports medicine in past, X-ray indicated scoliosis, NSAIDs and exercise recommended. She reports these measures have been  Helpful, but need refill for Duexis. Denies any new symptoms or injury since last OV.   Observations/Objective: Physical Exam  Constitutional: She is oriented to person, place, and time.  Neck: Normal range of motion. Neck supple.  Pulmonary/Chest: Effort normal.  Neurological: She is alert and oriented to person, place, and time.  Psychiatric: She has a normal mood and affect. Her behavior is normal. Thought content normal.  Vitals reviewed.  Assessment and Plan: Abigail [name] was seen today for follow-up.  Diagnoses and all orders for this visit:  Essential hypertension -     Basic metabolic panel; Future -     TSH; Future -     amLODipine (NORVASC) 5 MG tablet; Take 1 tablet (5 mg total) by mouth daily.  Chronic right-sided low back pain without sciatica -     Discontinue: Ibuprofen-Famotidine 800-26.6 MG TABS; Take 1 tablet by mouth 3 (three) times daily. -     Ibuprofen-Famotidine 800-26.6 MG TABS; Take 1 tablet by mouth 3 (three) times daily.  Gastroesophageal reflux  disease, esophagitis presence not specified -     omeprazole (PRILOSEC) 20 MG capsule; Take 1 capsule (20 mg total) by mouth daily.  Hyperglycemia -     Hemoglobin A1c; Future  Vitamin D insufficiency -     Vitamin D 1,25 dihydroxy; Future  Encounter for lipid screening for cardiovascular disease -     Lipid panel; Future  Seasonal allergic rhinitis, unspecified trigger -     fluticasone (FLONASE) 50 MCG/ACT nasal spray; Place 1 spray into both nostrils daily.   Follow Up Instructions: Due to inconsistency with Maxizide, will need to change medication to lisinopril after review of lab results. Return to lab on Thursday for blood draw (fasting)  Continue Duexis as needed for chronic back pain, and home exercises to help minimize discomfort.  F/up in 81months for HTN  I discussed the assessment and treatment plan with the patient. The patient was provided an opportunity to ask questions and all were answered. The patient agreed with the plan and demonstrated an understanding of the instructions.   The patient was advised to call back or seek an in-person evaluation if the symptoms worsen or if the condition fails to improve as anticipated.   Wilfred Lacy, NP

## 2018-08-23 NOTE — Patient Instructions (Addendum)
Due to inconsistency with Maxizide, will need to change medication to lisinopril after review of lab results. Return to lab on Thursday for blood draw (fasting)  Continue Duexis as needed for chronic back pain, and home exercises to help minimize discomfort.  F/up in 83months for HTN

## 2018-08-25 ENCOUNTER — Ambulatory Visit (INDEPENDENT_AMBULATORY_CARE_PROVIDER_SITE_OTHER): Payer: 59

## 2018-08-25 DIAGNOSIS — Z136 Encounter for screening for cardiovascular disorders: Secondary | ICD-10-CM | POA: Diagnosis not present

## 2018-08-25 DIAGNOSIS — E559 Vitamin D deficiency, unspecified: Secondary | ICD-10-CM

## 2018-08-25 DIAGNOSIS — I10 Essential (primary) hypertension: Secondary | ICD-10-CM | POA: Diagnosis not present

## 2018-08-25 DIAGNOSIS — Z13220 Encounter for screening for lipoid disorders: Secondary | ICD-10-CM | POA: Diagnosis not present

## 2018-08-25 DIAGNOSIS — R739 Hyperglycemia, unspecified: Secondary | ICD-10-CM

## 2018-08-25 LAB — LIPID PANEL
Cholesterol: 193 mg/dL (ref 0–200)
HDL: 51.5 mg/dL (ref >39.00)
LDL Cholesterol: 121 mg/dL — ABNORMAL HIGH (ref 0–99)
NonHDL: 141.01
Total CHOL/HDL Ratio: 4
Triglycerides: 100 mg/dL (ref 0.0–149.0)
VLDL: 20 mg/dL (ref 0.0–40.0)

## 2018-08-25 LAB — BASIC METABOLIC PANEL
BUN: 9 mg/dL (ref 6–23)
CO2: 27 mEq/L (ref 19–32)
Calcium: 9.3 mg/dL (ref 8.4–10.5)
Chloride: 101 mEq/L (ref 96–112)
Creatinine, Ser: 0.81 mg/dL (ref 0.40–1.20)
GFR: 90.98 mL/min (ref >60.00)
Glucose, Bld: 85 mg/dL (ref 70–99)
Potassium: 4.3 mEq/L (ref 3.5–5.1)
Sodium: 136 mEq/L (ref 135–145)

## 2018-08-25 LAB — TSH: TSH: 0.77 u[IU]/mL (ref 0.35–4.50)

## 2018-08-25 LAB — HEMOGLOBIN A1C: Hgb A1c MFr Bld: 6.2 % (ref 4.6–6.5)

## 2018-08-25 MED ORDER — LISINOPRIL 10 MG PO TABS: 10.0000 mg | ORAL_TABLET | Freq: Every day | ORAL | 1 refills | Status: DC

## 2018-08-27 LAB — VITAMIN D 1,25 DIHYDROXY
Vitamin D 1, 25 (OH)2 Total: 68 pg/mL (ref 18–72)
Vitamin D2 1, 25 (OH)2: 8 pg/mL
Vitamin D3 1, 25 (OH)2: 60 pg/mL

## 2018-08-30 ENCOUNTER — Telehealth: Payer: Self-pay | Admitting: Nurse Practitioner

## 2018-08-30 NOTE — Telephone Encounter (Signed)
Pt given lab results per notes of Wilfred Lacy on 08/29/18. Pt verbalized understanding.

## 2018-09-08 ENCOUNTER — Encounter: Payer: Self-pay | Admitting: Nurse Practitioner

## 2018-09-08 ENCOUNTER — Ambulatory Visit (INDEPENDENT_AMBULATORY_CARE_PROVIDER_SITE_OTHER): Payer: 59 | Admitting: Nurse Practitioner

## 2018-09-08 ENCOUNTER — Ambulatory Visit: Payer: Self-pay

## 2018-09-08 VITALS — Ht 66.0 in

## 2018-09-08 DIAGNOSIS — T23222A Burn of second degree of single left finger (nail) except thumb, initial encounter: Secondary | ICD-10-CM

## 2018-09-08 NOTE — Telephone Encounter (Signed)
Last Sunday, pt's left little finger was burned by a plastic container as she was removing it from the microwave. Pt ran it under cold water and applied an antibiotic ointment. Pt stated the blister is 2-3 inches long and filled with yellow fluid. Pt denies pain but is c/o numbness and tingling to the left hand. The burn is located on the side of the little finger between the knuckle and the nail bed.      Reason for Disposition . [1] Blister (intact or ruptured) AND [2] larger than 2 inches (5 cm)  Answer Assessment - Initial Assessment Questions 1. ONSET: "When did it happen?" If happened < 10 minutes ago, ask: "Did you apply cold water?" If not, give First Aid Advice immediately.      Last Sunday-yes 2. LOCATION: "Where is the burn located?"      Left pinky on side in between the knuckle and nail bed 3. BURN SIZE: "How large is the burn?"  The palm is roughly 1% of the total body surface area (BSA).     2-3 inches 4. SEVERITY OF THE BURN: "Are there any blisters?"      yes 5. MECHANISM: "Tell me how it happened."    Were taking plastic bag of brown sugar 6. PAIN: "Are you having any pain?" "How bad is the pain?" (Scale 1-10; or mild, moderate, severe)   - MILD (1-3): doesn't interfere with normal activities    - MODERATE (4-7): interferes with normal activities or awakens from sleep    - SEVERE (8-10): excruciating pain, unable to do any normal activities      Whole hand feels numbness and tingling. No pain 7. INHALATION INJURY: "Were you exposed to any smoke or fumes?" If yes: "Do you have any cough or difficulty breathing?"    No 8. OTHER SYMPTOMS: "Do you have any other symptoms?" (e.g., headache, nausea)     numbness tingling 9. PREGNANCY: "Is there any chance you are pregnant?" "When was your last menstrual period?"     n/a  Protocols used: BURNS - [facility]

## 2018-09-08 NOTE — Patient Instructions (Signed)
Call office if any since of infection.  Burn Care, Adult A burn is an injury to the skin or the tissues under the skin. There are three types of burns:  First degree. These burns may cause the skin to be red and a bit swollen.  Second degree. These burns are very painful and cause the skin to be very red. The skin may also leak fluid, look shiny, and start to have blisters.  Third degree. These burns cause permanent damage. They turn the skin white or black and make it look charred, dry, and leathery. Taking care of your burn properly can help to prevent pain and infection. It can also help the burn to heal more quickly. How is this treated? Right after a burn:  Rinse or soak the burn under cool water. Do this for several minutes. Do not put ice on your burn. That can cause more damage.  Lightly cover the burn with a clean (sterile) cloth (dressing). Burn care  Raise (elevate) the injured area above the level of your heart while sitting or lying down.  Follow instructions from your doctor about: ? How to clean and take care of the burn. ? When to change and remove the cloth.  Check your burn every day for signs of infection. Check for: ? More redness, swelling, or pain. ? Warmth. ? Pus or a bad smell. Medicine   Take over-the-counter and prescription medicines only as told by your doctor.  If you were prescribed antibiotic medicine, take or apply it as told by your doctor. Do not stop using the antibiotic even if your condition improves. General instructions  To prevent infection: ? Do not put butter, oil, or other home treatments on the burn. ? Do not scratch or pick at the burn. ? Do not break any blisters. ? Do not peel skin.  Do not rub your burn, even when you are cleaning it.  Protect your burn from the sun. Contact a doctor if:  Your condition does not get better.  Your condition gets worse.  You have a fever.  Your burn looks different or starts to have  black or red spots on it.  Your burn feels warm to the touch.  Your pain is not controlled with medicine. Get help right away if:  You have redness, swelling, or pain at the site of the burn.  You have fluid, blood, or pus coming from your burn.  You have red streaks near the burn.  You have very bad pain. This information is not intended to replace advice given to you by your health care provider. Make sure you discuss any questions you have with your health care provider. Document Released: 02/04/2008 Document Revised: 12/08/2016 Document Reviewed: 10/15/2015 Elsevier Interactive Patient Education  2019 [employer].

## 2018-09-08 NOTE — Progress Notes (Signed)
Virtual Visit via Video Note  I connected with Abigail [name] on 09/08/18 at  2:30 PM EDT by a video enabled telemedicine application and verified that I am speaking with the correct person using two identifiers.  Location: Patient: Home Provider: Office   I discussed the limitations of evaluation and management by telemedicine and the availability of in person appointments. The patient expressed understanding and agreed to proceed.  CC: blister on left pinky, blister w/ whilte yellow fluid inside, numbess and thingling/ 4 days ago/ ran it under cold water. FYI--no vital sign to share today.  History of Present Illness: Hand Pain   The incident occurred 3 to 5 days ago. The incident occurred at home. The injury mechanism was a burn. Pain location: left little finger. The quality of the pain is described as burning (and numbness). Radiates to: to palm and wrist. The pain has been constant since the incident. Associated symptoms include numbness and tingling. Pertinent negatives include no chest pain or muscle weakness. Nothing aggravates the symptoms. She has tried nothing for the symptoms.  burnt skin on hot plastic on Sunday, applied cold water on skin   Observations/Objective: No vital signs to provide. Physical Exam  Constitutional: No distress.  Pulmonary/Chest: Effort normal.  Musculoskeletal:        General: No deformity or edema.     Left wrist: Normal.     Left hand: She exhibits normal range of motion, no laceration and no swelling.       Hands:  Skin: No erythema.   Assessment and Plan: Abigail [name] was seen today for hand pain.  Diagnoses and all orders for this visit:  Partial thickness burn of finger of left hand, initial encounter   Follow Up Instructions: Provided instructions on wound care and signs of infection Call office if any sign of infection.   I discussed the assessment and treatment plan with the patient. The patient was provided an opportunity to ask  questions and all were answered. The patient agreed with the plan and demonstrated an understanding of the instructions.   The patient was advised to call back or seek an in-person evaluation if the symptoms worsen or if the condition fails to improve as anticipated.   Wilfred Lacy, NP

## 2018-10-17 ENCOUNTER — Ambulatory Visit: Payer: Self-pay | Admitting: *Deleted

## 2018-10-17 NOTE — Telephone Encounter (Signed)
Pt reports cough, headache x 3 weeks. States cough keeps her up at night. Has tried Zyrtec, ineffective. Reports sinus tenderness. Denies fever, SOB. States had discussed with C. Nche the possibility cough was from lisinopril "But I don't think that's it." After hours call. Verified pts email and Ph#. Please advise regarding appt.   CB# [id number]   Reason for Disposition . Cough has been present for > 3 weeks  Answer Assessment - Initial Assessment Questions 1. ONSET: "When did the cough begin?"      3 weeks ago 2. SEVERITY: "How bad is the cough today?"      Moderate, keeps me awake at night 3. RESPIRATORY DISTRESS: "Describe your breathing."      wnl 4. FEVER: "Do you have a fever?" If so, ask: "What is your temperature, how was it measured, and when did it start?"     no 5. HEMOPTYSIS: "Are you coughing up any blood?" If so ask: "How much?" (flecks, streaks, tablespoons, etc.)     no 6. TREATMENT: "What have you done so far to treat the cough?" (e.g., meds, fluids, humidifier)     Zyrtec.  7. CARDIAC HISTORY: "Do you have any history of heart disease?" (e.g., heart attack, congestive heart failure)       8. LUNG HISTORY: "Do you have any history of lung disease?"  (e.g., pulmonary embolus, asthma, emphysema)     Seasonal allergies 9. PE RISK FACTORS: "Do you have a history of blood clots?" (or: recent major surgery, recent prolonged travel, bedridden)     *No Answer* 10. OTHER SYMPTOMS: "Do you have any other symptoms? (e.g., runny nose, weezing, chest pain)       Headache, sinus tenderness  Protocols used: COUGH - ACUTE NON-PRODUCTIVE-A-AH

## 2018-10-18 NOTE — Telephone Encounter (Signed)
I called and spoke to patient. Patient scheduled for 10/19/2018 @ 8:15am with Nche.

## 2018-10-18 NOTE — Telephone Encounter (Signed)
Please help call and offer an appt with Nche.

## 2018-10-18 NOTE — Telephone Encounter (Signed)
Please advise 

## 2018-10-18 NOTE — Telephone Encounter (Signed)
Please schedule video appt

## 2018-10-19 ENCOUNTER — Other Ambulatory Visit: Payer: Self-pay

## 2018-10-19 ENCOUNTER — Ambulatory Visit (INDEPENDENT_AMBULATORY_CARE_PROVIDER_SITE_OTHER): Payer: 59 | Admitting: Nurse Practitioner

## 2018-10-19 ENCOUNTER — Encounter: Payer: Self-pay | Admitting: Nurse Practitioner

## 2018-10-19 VITALS — BP 124/82 | Temp 98.1°F | Ht 66.0 in

## 2018-10-19 DIAGNOSIS — I10 Essential (primary) hypertension: Secondary | ICD-10-CM | POA: Diagnosis not present

## 2018-10-19 DIAGNOSIS — R058 Other specified cough: Secondary | ICD-10-CM

## 2018-10-19 DIAGNOSIS — G43009 Migraine without aura, not intractable, without status migrainosus: Secondary | ICD-10-CM | POA: Diagnosis not present

## 2018-10-19 DIAGNOSIS — R05 Cough: Secondary | ICD-10-CM | POA: Diagnosis not present

## 2018-10-19 MED ORDER — SUMATRIPTAN SUCCINATE 25 MG PO TABS: 25.0000 mg | ORAL_TABLET | ORAL | 0 refills | Status: DC | PRN

## 2018-10-19 MED ORDER — LOSARTAN POTASSIUM 25 MG PO TABS: 25.0000 mg | ORAL_TABLET | Freq: Every day | ORAL | 5 refills | Status: DC

## 2018-10-19 NOTE — Assessment & Plan Note (Signed)
Improved with lisinopril and amlodipine. Lisinopril is possible cause of cough, so changed to losartan.  BP Readings from Last 3 Encounters:  10/19/18 124/82  08/23/18 (!) 143/80  05/06/18 130/82

## 2018-10-19 NOTE — Progress Notes (Signed)
Virtual Visit via Video Note  I connected with Abigail [name] on 10/19/18 at  8:15 AM EDT by a video enabled telemedicine application and verified that I am speaking with the correct person using two identifiers.  Location: Patient: Home Provider: Office   I discussed the limitations of evaluation and management by telemedicine and the availability of in person appointments. The patient expressed understanding and agreed to proceed.  CC: cough x 64months. Headache, on and off.  History of Present Illness: Cough  This is a new problem. The current episode started more than 1 month ago (onset with lisinopril prescription (08/2018)). The problem has been gradually worsening. The problem occurs constantly. The cough is productive of sputum. Associated symptoms include headaches, heartburn and a sore throat. Pertinent negatives include no chest pain, fever, nasal congestion, postnasal drip, rhinorrhea, shortness of breath, sweats, weight loss or wheezing. The symptoms are aggravated by lying down. She has tried OTC cough suppressant and body position changes (zyrtec, and flonase) for the symptoms. The treatment provided no relief. Her past medical history is significant for environmental allergies. There is no history of asthma, bronchitis, COPD, emphysema or pneumonia.  Headache   This is a chronic problem. The current episode started more than 1 year ago (since 2015, no chnage per patient). The problem occurs intermittently. The problem has been waxing and waning. The pain is located in the frontal and occipital region. The pain does not radiate. The quality of the pain is described as aching. Associated symptoms include coughing and a sore throat. Pertinent negatives include no abnormal behavior, anorexia, blurred vision, dizziness, drainage, eye watering, fever, insomnia, loss of balance, muscle aches, nausea, numbness, phonophobia, photophobia, rhinorrhea, tingling, tinnitus, visual change or weight  loss. The symptoms are aggravated by unknown. She has tried acetaminophen, Excedrin and NSAIDs (and fioricet) for the symptoms. The treatment provided no relief. Her past medical history is significant for hypertension, migraine headaches and obesity. There is no history of cancer, cluster headaches, immunosuppression, migraines in the family, pseudotumor cerebri, recent head traumas, sinus disease or TMJ.   Does not take omeprazole daily. She though headache was related to seasonal allergies, but no improvement with zyrtec, allegra and flonase. Head CT head done 2013 due to headache: normal with clear sinuses.  Observations/Objective: Physical Exam  Constitutional: She is oriented to person, place, and time. No distress.  Eyes: Conjunctivae and EOM are normal.  Neck: Normal range of motion. Neck supple.  Pulmonary/Chest: Effort normal.  Neurological: She is alert and oriented to person, place, and time.  Psychiatric: She has a normal mood and affect. Her behavior is normal. Thought content normal.  Vitals reviewed.  Assessment and Plan: Abigail [name] was seen today for cough.  Diagnoses and all orders for this visit:  Essential hypertension -     losartan (COZAAR) 25 MG tablet; Take 1 tablet (25 mg total) by mouth daily.  Migraine without aura and without status migrainosus, not intractable -     SUMAtriptan (IMITREX) 25 MG tablet; Take 1 tablet (25 mg total) by mouth every 2 (two) hours as needed for migraine. Do not use more than 100mg  in 24hrs  Cough with sputum   Follow Up Instructions: Resume omeprazole, take at bedtime x 2weeks. Changed lisinopril to losartan. Use imitrex for headache Call office if no improvement of cough in 2weeks. Call office if imitrex does not help with headache   I discussed the assessment and treatment plan with the patient. The patient was provided an opportunity  to ask questions and all were answered. The patient agreed with the plan and demonstrated an  understanding of the instructions.   The patient was advised to call back or seek an in-person evaluation if the symptoms worsen or if the condition fails to improve as anticipated.   Wilfred Lacy, NP

## 2018-10-19 NOTE — Patient Instructions (Signed)
Resume omeprazole, take at bedtime x 2weeks. Changed lisinopril to losartan. Use imitrex for headache Call office if no improvement of cough in 2weeks. Call office if imitrex does not help with headache

## 2018-10-19 NOTE — Assessment & Plan Note (Signed)
Onset 2013 Waxing and waning Location: frontal and occipital Unknown trigger. Ct head done 2013: normal with clear sinuses No improvement with tylenol, ibuprofen, excedrin, muscle relaxants, and fioricet  Prescribed Imitrex today. Consider repeat head Ct and/or referral to neurology if no improvement

## 2018-11-07 ENCOUNTER — Ambulatory Visit: Payer: Self-pay | Admitting: Nurse Practitioner

## 2018-11-17 ENCOUNTER — Telehealth: Payer: Self-pay | Admitting: Nurse Practitioner

## 2018-11-17 DIAGNOSIS — J0140 Acute pansinusitis, unspecified: Secondary | ICD-10-CM

## 2018-11-17 MED ORDER — AZITHROMYCIN 250 MG PO TABS: 250.0000 mg | ORAL_TABLET | Freq: Every day | ORAL | 0 refills | Status: DC

## 2018-11-17 NOTE — Telephone Encounter (Signed)
Pt had virtual visit with charlotte on 10-19-2018 and her bp med was changed. Pt has tried omeprazole every day not relief. Pt still has the cough for about 1 month. Pt is still gagging/vomiting only at night. Pt would like to try an abx. [facility] in [place] [address]. Pt sinus is stopped up at night

## 2018-11-17 NOTE — Telephone Encounter (Signed)
Pt is aware.  

## 2018-11-17 NOTE — Telephone Encounter (Signed)
Please advise 

## 2018-11-17 NOTE — Telephone Encounter (Signed)
Azithromycin sent. If no improvement, you will be additional evaluation by GI

## 2018-11-29 ENCOUNTER — Encounter: Payer: Self-pay | Admitting: Nurse Practitioner

## 2018-11-29 ENCOUNTER — Other Ambulatory Visit: Payer: Self-pay

## 2018-11-29 ENCOUNTER — Ambulatory Visit: Payer: 59 | Admitting: Nurse Practitioner

## 2018-11-29 VITALS — BP 146/92 | HR 81 | Temp 98.6°F | Ht 66.0 in | Wt 186.2 lb

## 2018-11-29 DIAGNOSIS — R002 Palpitations: Secondary | ICD-10-CM | POA: Diagnosis not present

## 2018-11-29 DIAGNOSIS — I10 Essential (primary) hypertension: Secondary | ICD-10-CM

## 2018-11-29 DIAGNOSIS — G43009 Migraine without aura, not intractable, without status migrainosus: Secondary | ICD-10-CM | POA: Diagnosis not present

## 2018-11-29 NOTE — Progress Notes (Signed)
Subjective:  Patient ID: Abigail [name], female    DOB: [birth year]  Age: 48 y.o. MRN: [id number]  CC: Follow-up (follow up on BP--BP at home runs about 120/86./ FYI--take BP med in the PM--had apple this morning. MM due--pt will call to [facility]. )  Palpitations  This is a recurrent problem. The current episode started more than 1 year ago. The problem occurs intermittently. The problem has been waxing and waning. On average, each episode lasts 30 minutes. The symptoms are aggravated by unknown. Associated symptoms include chest fullness and an irregular heartbeat. Pertinent negatives include no anxiety, chest pain, coughing, diaphoresis, dizziness, fever, malaise/fatigue, nausea, near-syncope, numbness, shortness of breath, syncope, vomiting or weakness. She has tried deep relaxation for the symptoms. The treatment provided significant relief. Risk factors include obesity and sedentary lifestyle. Her past medical history is significant for anxiety. There is no history of anemia, drug use, heart disease, hyperthyroidism or a valve disorder.  ECG, Echocardiogram and CT coronary done 05/2016: normal.  HTN: Elevated this morning. Takes medications everr afternoon. Does not check BP at home regularly. Persistent intermittent cough despite change of lisinopril to losartan. She thinks it is related to environmental allergen in her home. BP Readings from Last 3 Encounters:  11/29/18 (!) 146/92  10/19/18 124/82  08/23/18 (!) 143/80   Migraines: Improved with use of imtrex prn.  Reviewed past Medical, Social and Family history today.  Outpatient Medications Prior to Visit  Medication Sig Dispense Refill  . amLODipine (NORVASC) 5 MG tablet Take 1 tablet (5 mg total) by mouth daily. 90 tablet 1  . cetirizine (ZYRTEC) 10 MG tablet Take 10 mg by mouth daily.    . fluticasone (FLONASE) 50 MCG/ACT nasal spray Place 1 spray into both nostrils daily. 16 g 5  . Ibuprofen-Famotidine 800-26.6 MG TABS Take 1  tablet by mouth 3 (three) times daily. 90 tablet 3  . losartan (COZAAR) 25 MG tablet Take 1 tablet (25 mg total) by mouth daily. 30 tablet 5  . metroNIDAZOLE (METROGEL) 0.75 % vaginal gel Place 1 Applicatorful vaginally 2 (two) times daily.    Marland Kitchen omeprazole (PRILOSEC) 20 MG capsule Take 1 capsule (20 mg total) by mouth daily. 90 capsule 1  . saccharomyces boulardii (FLORASTOR) 250 MG capsule Take 250 mg by mouth 2 (two) times daily.    . SUMAtriptan (IMITREX) 25 MG tablet Take 1 tablet (25 mg total) by mouth every 2 (two) hours as needed for migraine. Do not use more than 100mg  in 24hrs 10 tablet 0  . triamcinolone ointment (KENALOG) 0.5 % Apply 1 application topically 2 (two) times daily. 30 g 0  . Vitamin D, Ergocalciferol, 2000 units CAPS Take 1 capsule by mouth daily after breakfast. 90 capsule 1  . azithromycin (ZITHROMAX Z-PAK) 250 MG tablet Take 1 tablet (250 mg total) by mouth daily. Take 2tabs on first day, then 1tab once a day till complete (Patient not taking: Reported on 11/29/2018) 6 tablet 0   No facility-administered medications prior to visit.     ROS See HPI  Objective:  BP (!) 146/92   Pulse 81   Temp 98.6 F (37 C) (Oral)   Ht 5\' 6"  (1.676 m)   Wt 186 lb 3.2 oz (84.5 kg)   LMP 01/18/2012 (Exact Date)   SpO2 98%   BMI 30.05 kg/m   BP Readings from Last 3 Encounters:  11/29/18 (!) 146/92  10/19/18 124/82  08/23/18 (!) 143/80    Wt Readings from Last 3 Encounters:  11/29/18 186 lb 3.2 oz (84.5 kg)  05/06/18 191 lb 12.8 oz (87 kg)  04/06/18 190 lb 9.6 oz (86.5 kg)    Physical Exam Constitutional:      General: She is not in acute distress. Neck:     Musculoskeletal: Normal range of motion.  Cardiovascular:     Rate and Rhythm: Normal rate and regular rhythm.     Pulses: Normal pulses.     Heart sounds: Normal heart sounds. No murmur. No gallop.   Pulmonary:     Effort: Pulmonary effort is normal.     Breath sounds: Normal breath sounds.   Musculoskeletal:     Right lower leg: No edema.     Left lower leg: No edema.  Neurological:     Mental Status: She is alert and oriented to person, place, and time.  Psychiatric:        Mood and Affect: Mood normal.        Behavior: Behavior normal.        Thought Content: Thought content normal.    Lab Results  Component Value Date   WBC 5.4 07/27/2017   HGB 13.6 07/27/2017   HCT 40.2 07/27/2017   PLT 312.0 07/27/2017   GLUCOSE 85 08/25/2018   CHOL 193 08/25/2018   TRIG 100.0 08/25/2018   HDL 51.50 08/25/2018   LDLCALC 121 (H) 08/25/2018   ALT 23 07/27/2017   AST 34 07/27/2017   NA 136 08/25/2018   K 4.3 Hemolysis seen.Marland Kitchen 08/25/2018   CL 101 08/25/2018   CREATININE 0.81 08/25/2018   BUN 9 08/25/2018   CO2 27 08/25/2018   TSH 0.77 08/25/2018   HGBA1C 6.2 08/25/2018    Mm 3d Screen Breast Bilateral  Result Date: 10/18/2017 CLINICAL DATA:  Screening. EXAM: DIGITAL SCREENING BILATERAL MAMMOGRAM WITH TOMO AND CAD COMPARISON:  Previous exam(s). ACR Breast Density Category b: There are scattered areas of fibroglandular density. FINDINGS: There are no findings suspicious for malignancy. Images were processed with CAD. IMPRESSION: No mammographic evidence of malignancy. A result letter of this screening mammogram will be mailed directly to the patient. RECOMMENDATION: Screening mammogram in one year. (Code:SM-B-01Y) BI-RADS CATEGORY  1: Negative. Electronically Signed   By: Ammie Ferrier M.D.   On: 10/18/2017 09:34    Assessment & Plan:   Rakia was seen today for follow-up.  Diagnoses and all orders for this visit:  Essential hypertension  Migraine without aura and without status migrainosus, not intractable  Intermittent palpitations -     Holter monitor - 48 hour; Future   I am having Abigail [name] maintain her cetirizine, saccharomyces boulardii, Vitamin D (Ergocalciferol), triamcinolone ointment, amLODipine, omeprazole, fluticasone, Ibuprofen-Famotidine,  metroNIDAZOLE, SUMAtriptan, losartan, and azithromycin.  No orders of the defined types were placed in this encounter.   Problem List Items Addressed This Visit      Cardiovascular and Mediastinum   HTN (hypertension) - Primary   Migraine without aura and without status migrainosus, not intractable    Other Visit Diagnoses    Intermittent palpitations       Relevant Orders   Holter monitor - 48 hour       Follow-up: Return in about 3 months (around 03/01/2019) for HTN and , hyperlipidemia (fasting).  Wilfred Lacy, NP

## 2018-11-29 NOTE — Patient Instructions (Addendum)
Schedule annual mammogram.  You will be contacted to schedule appt for Holter monitor.  Maintain current medications.  Check BP 2x/week and record. Call office if BP >150/80 on more than 3occassions .

## 2018-12-09 ENCOUNTER — Telehealth: Payer: Self-pay | Admitting: Radiology

## 2018-12-09 NOTE — Telephone Encounter (Signed)
Enrolled patient for a 3 day Zio monitor to be mailed due to covid. Brief instructions were left on patients voicemail (DPR on file) and patient knows to expect monitor to arrive in 3-4 days.  *48hr holter was switched to a 3 day Zio for mailing purposes. Patient instructed to wear at least 48 hrs or up to 3 days.

## 2018-12-24 ENCOUNTER — Other Ambulatory Visit (INDEPENDENT_AMBULATORY_CARE_PROVIDER_SITE_OTHER): Payer: 59

## 2018-12-24 DIAGNOSIS — R002 Palpitations: Secondary | ICD-10-CM | POA: Diagnosis not present

## 2019-03-31 ENCOUNTER — Encounter: Payer: Self-pay | Admitting: Obstetrics and Gynecology

## 2019-03-31 ENCOUNTER — Other Ambulatory Visit: Payer: Self-pay

## 2019-03-31 ENCOUNTER — Ambulatory Visit: Payer: 59 | Admitting: Obstetrics and Gynecology

## 2019-03-31 VITALS — BP 122/80 | HR 68 | Temp 97.4°F | Resp 12 | Ht 66.0 in | Wt 184.2 lb

## 2019-03-31 DIAGNOSIS — Z113 Encounter for screening for infections with a predominantly sexual mode of transmission: Secondary | ICD-10-CM

## 2019-03-31 DIAGNOSIS — L249 Irritant contact dermatitis, unspecified cause: Secondary | ICD-10-CM

## 2019-03-31 DIAGNOSIS — R102 Pelvic and perineal pain: Secondary | ICD-10-CM | POA: Diagnosis not present

## 2019-03-31 DIAGNOSIS — R1900 Intra-abdominal and pelvic swelling, mass and lump, unspecified site: Secondary | ICD-10-CM

## 2019-03-31 DIAGNOSIS — R350 Frequency of micturition: Secondary | ICD-10-CM

## 2019-03-31 LAB — POCT URINALYSIS DIPSTICK
Bilirubin, UA: NEGATIVE
Blood, UA: NEGATIVE
Glucose, UA: NEGATIVE
Ketones, UA: NEGATIVE
Leukocytes, UA: NEGATIVE
Nitrite, UA: NEGATIVE
Protein, UA: NEGATIVE
Urobilinogen, UA: 0.2 E.U./dL
pH, UA: 5 (ref 5.0–8.0)

## 2019-03-31 MED ORDER — TRIAMCINOLONE ACETONIDE 0.5 % EX OINT: 1.0000 "application " | TOPICAL_OINTMENT | Freq: Two times a day (BID) | CUTANEOUS | 0 refills | Status: DC

## 2019-03-31 MED ORDER — FLUCONAZOLE 150 MG PO TABS: 150.0000 mg | ORAL_TABLET | Freq: Every day | ORAL | 0 refills | Status: DC

## 2019-03-31 NOTE — Progress Notes (Signed)
GYNECOLOGY  VISIT   HPI: 48 y.o.   Married  [place] American  female   [phone] with Patient's last menstrual period was 01/18/2012 (exact date).   here for pelvic pain, frequent urination; patient would like STD testing     She needed to take an ASA for her pain, which is occurring for about 2 weeks.  Achy dull pain in her lower pelvis.   It is constant.   Self treated with Metrogel and used it last 3 - 4 days ago.  She just did a 6 month suppression treatment.   Hx BV.  Voiding often.  No dysuria.   Last BM was yesterday and small.  Does not have regular BMs. Feels full.  She and her husband split up for a period of time and then reunited.  She also has a rash in her panty line and has had it for a while. Triamcinolone helps somewhat, but she is not consistent with her use of this.  Urine: Negative  GYNECOLOGIC HISTORY: Patient's last menstrual period was 01/18/2012 (exact date). Contraception:  Hysterectomy Menopausal hormone therapy:  none Last mammogram:  10/15/17 BIRADS 1 negative/density b Last pap smear:   07/29/14 Neg:Neg HR HPV        OB History    Gravida  3   Para  2   Term  2   Preterm  0   AB  1   Living  2     SAB  1   TAB  0   Ectopic  0   Multiple  0   Live Births  2              Patient Active Problem List   Diagnosis Date Noted  . Intermittent palpitations 11/29/2018  . Migraine without aura and without status migrainosus, not intractable 10/19/2018  . Chronic right-sided low back pain without sciatica 12/13/2017  . Vitamin D insufficiency 07/27/2017  . Seasonal allergic rhinitis 07/27/2017  . Gallop rhythm 06/25/2017  . Generalized anxiety disorder 04/10/2016  . Hyperglycemia 04/10/2016  . GERD (gastroesophageal reflux disease) 04/10/2016  . Environmental allergies 06/17/2012  . S/p Abdominal Supracervical Hysterectomy, Left Salpingoohorectomy, Right Salpingectomy on 01/18/12 01/18/2012  . HTN (hypertension) 10/29/2011     Past Medical History:  Diagnosis Date  . Anemia    iron supp  . Anxiety   . Depression   . Fibroids   . GERD (gastroesophageal reflux disease)    omeprazole  . Headache(784.0)   . Hypertension 2012   Maxide  . S/P LEEP 1992  . Urticaria     Past Surgical History:  Procedure Laterality Date  . Inicision and drainage of thrombosed hemorrhoid  04/06/2018  . LEEP  1998   Abnormal pap  . SUPRACERVICAL ABDOMINAL HYSTERECTOMY  01/18/2012   Procedure: HYSTERECTOMY SUPRACERVICAL ABDOMINAL with LSO;  Surgeon: Osborne Oman, MD;  Location: [facility];  Service: Gynecology;  Laterality: N/A;  . TUBAL LIGATION  1997  . WISDOM TOOTH EXTRACTION      Current Outpatient Medications  Medication Sig Dispense Refill  . amLODipine (NORVASC) 5 MG tablet Take 1 tablet (5 mg total) by mouth daily. 90 tablet 1  . cetirizine (ZYRTEC) 10 MG tablet Take 10 mg by mouth daily.    . fluticasone (FLONASE) 50 MCG/ACT nasal spray Place 1 spray into both nostrils daily. 16 g 5  . Ibuprofen-Famotidine 800-26.6 MG TABS Take 1 tablet by mouth 3 (three) times daily. 90 tablet 3  . losartan (COZAAR)  25 MG tablet Take 1 tablet (25 mg total) by mouth daily. 30 tablet 5  . omeprazole (PRILOSEC) 20 MG capsule Take 1 capsule (20 mg total) by mouth daily. 90 capsule 1  . SUMAtriptan (IMITREX) 25 MG tablet Take 1 tablet (25 mg total) by mouth every 2 (two) hours as needed for migraine. Do not use more than 100mg  in 24hrs 10 tablet 0  . triamcinolone ointment (KENALOG) 0.5 % Apply 1 application topically 2 (two) times daily. 30 g 0  . Vitamin D, Ergocalciferol, 2000 units CAPS Take 1 capsule by mouth daily after breakfast. 90 capsule 1  . saccharomyces boulardii (FLORASTOR) 250 MG capsule Take 250 mg by mouth 2 (two) times daily.     No current facility-administered medications for this visit.      ALLERGIES: Shellfish allergy and Tylox [oxycodone-acetaminophen]  Family History  Problem Relation Age of Onset  .  Hypertension Father   . Depression Father   . Diabetes Father   . Alcohol abuse Father   . Colonic polyp Father   . Diabetes Sister   . Diabetes Brother   . Hypertension Mother   . Arthritis Mother   . Pulmonary fibrosis Mother   . Hypertension Sister   . Cancer Paternal Grandmother        breast  . Heart attack Paternal Grandmother   . Allergic rhinitis Neg Hx   . Angioedema Neg Hx   . Asthma Neg Hx   . Eczema Neg Hx   . Immunodeficiency Neg Hx   . Urticaria Neg Hx     Social History   Socioeconomic History  . Marital status: Married    Spouse name: Not on file  . Number of children: Not on file  . Years of education: 73  . Highest education level: Not on file  Occupational History  . Occupation: MEDICAL ASSISTANT    Employer: [employer]  . Financial resource strain: Not on file  . Food insecurity    Worry: Not on file    Inability: Not on file  . Transportation needs    Medical: Not on file    Non-medical: Not on file  Tobacco Use  . Smoking status: Never Smoker  . Smokeless tobacco: Never Used  Substance and Sexual Activity  . Alcohol use: Yes    Alcohol/week: 1.0 standard drinks    Types: 1 Standard drinks or equivalent per week    Comment: occasional  . Drug use: No  . Sexual activity: Yes    Birth control/protection: Surgical    Comment: Hysterectomy  Lifestyle  . Physical activity    Days per week: Not on file    Minutes per session: Not on file  . Stress: Not on file  Relationships  . Social Herbalist on phone: Not on file    Gets together: Not on file    Attends religious service: Not on file    Active member of club or organization: Not on file    Attends meetings of clubs or organizations: Not on file    Relationship status: Not on file  . Intimate partner violence    Fear of current or ex partner: Not on file    Emotionally abused: Not on file    Physically abused: Not on file    Forced sexual activity:  Not on file  Other Topics Concern  . Not on file  Social History Narrative   Regular exercise-no   Caffeine  Use-yes    Review of Systems  Constitutional: Negative.   HENT: Negative.   Eyes: Negative.   Respiratory: Negative.   Cardiovascular: Negative.   Gastrointestinal: Negative.   Endocrine: Negative.   Genitourinary: Positive for frequency and pelvic pain.  Musculoskeletal: Negative.   Skin: Negative.   Allergic/Immunologic: Negative.   Neurological: Negative.   Hematological: Negative.   Psychiatric/Behavioral: Negative.     PHYSICAL EXAMINATION:    BP 122/80 (BP Location: Right Arm, Patient Position: Sitting, Cuff Size: Large)   Pulse 68   Temp (!) 97.4 F (36.3 C) (Temporal)   Resp 12   Ht 5\' 6"  (1.676 m)   Wt 184 lb 3.2 oz (83.6 kg)   LMP 01/18/2012 (Exact Date)   BMI 29.73 kg/m     General appearance: alert, cooperative and appears stated age   Pelvic: External genitalia:  no lesions.  Erythematous rash of left groin region.               Urethra:  normal appearing urethra with no masses, tenderness or lesions              Bartholins and Skenes: normal                 Vagina: normal appearing vagina with normal color and discharge, no lesions              Cervix: no lesions                Bimanual Exam:  Uterus:   Absent.  mildline pelvic mass 3.5 cm, slightly tender.              Adnexa: no mass, fullness, tenderness              Chaperone was present for exam.  ASSESSMENT  Status post supracervical hysterectomy, LSO, right salpingectomy for fibroids.  Right ovary remains. Pelvic pain.  Pelvic mass.  STD screening.  Recurrent BV. Skin rash in flexural fold.  ? Yeast.  Frequent urination.  Negative urine dip.  PLAN  STD screening today.  Consider boric acid tx and abx tx if has recurrent BV.  Rx for Diflucan and Triamcinolone for vulvar rash. To dermatology if rash does not respond. Return for pelvic US.   An After Visit Summary was  printed and given to the patient.  ____25__ minutes face to face time of which over 50% was spent in counseling.

## 2019-04-01 LAB — HEP, RPR, HIV PANEL
HIV Screen 4th Generation wRfx: NONREACTIVE
Hepatitis B Surface Ag: NEGATIVE
RPR Ser Ql: NONREACTIVE

## 2019-04-01 LAB — HEPATITIS C ANTIBODY: Hep C Virus Ab: <0.1 s/co ratio (ref 0.0–0.9)

## 2019-04-04 LAB — NUSWAB VAGINITIS PLUS (VG+)
Candida albicans, NAA: NEGATIVE
Candida glabrata, NAA: NEGATIVE
Chlamydia trachomatis, NAA: NEGATIVE
Neisseria gonorrhoeae, NAA: NEGATIVE
Trich vag by NAA: NEGATIVE

## 2019-04-10 ENCOUNTER — Telehealth: Payer: Self-pay | Admitting: Obstetrics and Gynecology

## 2019-04-10 NOTE — Telephone Encounter (Signed)
Patient returned call. Reviewed benefit for recommended ultrasound. Patient acknowledges understanding of informaiton presented. Patient is scheduled for an ultrasound on 04/13/2019 with Dr Quincy Simmonds. Patient is aware of the appointment date, the arrival time and cancellation policy. No further questions. Will close encounter

## 2019-04-10 NOTE — Telephone Encounter (Signed)
Call placed to patient to review benefit and scheduled recommended ultrasound. Left voicemail message requesting a return call

## 2019-04-12 ENCOUNTER — Other Ambulatory Visit: Payer: Self-pay

## 2019-04-13 ENCOUNTER — Ambulatory Visit: Payer: 59 | Admitting: Obstetrics and Gynecology

## 2019-04-13 ENCOUNTER — Ambulatory Visit (INDEPENDENT_AMBULATORY_CARE_PROVIDER_SITE_OTHER): Payer: 59

## 2019-04-13 ENCOUNTER — Encounter: Payer: Self-pay | Admitting: Obstetrics and Gynecology

## 2019-04-13 VITALS — BP 142/82 | HR 68 | Temp 97.4°F | Ht 66.0 in | Wt 186.6 lb

## 2019-04-13 DIAGNOSIS — R102 Pelvic and perineal pain: Secondary | ICD-10-CM | POA: Diagnosis not present

## 2019-04-13 DIAGNOSIS — R1900 Intra-abdominal and pelvic swelling, mass and lump, unspecified site: Secondary | ICD-10-CM

## 2019-04-13 NOTE — Progress Notes (Signed)
GYNECOLOGY  VISIT   HPI: 48 y.o.   Married  [place] American  female   [phone] with Patient's last menstrual period was 01/18/2012 (exact date).   here for pelvic ultrasound for constant lower pelvic pain, and 3.4 cm pelvic mass noted on exam on 03/31/19.  She took some Advil to relieve increased pain a couple of days after her last visit.  States she is surprised that her BV testing was negative.  GYNECOLOGIC HISTORY: Patient's last menstrual period was 01/18/2012 (exact date). Contraception: Hysterectomy Menopausal hormone therapy:  none Last mammogram:  10/15/17 BIRADS 1 negative/density b Last pap smear:07-28-16 Neg:Neg HR HPV ,07/29/14 Neg:Neg HR HPV        OB History    Gravida  3   Para  2   Term  2   Preterm  0   AB  1   Living  2     SAB  1   TAB  0   Ectopic  0   Multiple  0   Live Births  2              Patient Active Problem List   Diagnosis Date Noted  . Intermittent palpitations 11/29/2018  . Migraine without aura and without status migrainosus, not intractable 10/19/2018  . Chronic right-sided low back pain without sciatica 12/13/2017  . Vitamin D insufficiency 07/27/2017  . Seasonal allergic rhinitis 07/27/2017  . Gallop rhythm 06/25/2017  . Generalized anxiety disorder 04/10/2016  . Hyperglycemia 04/10/2016  . GERD (gastroesophageal reflux disease) 04/10/2016  . Environmental allergies 06/17/2012  . S/p Abdominal Supracervical Hysterectomy, Left Salpingoohorectomy, Right Salpingectomy on 01/18/12 01/18/2012  . HTN (hypertension) 10/29/2011    Past Medical History:  Diagnosis Date  . Anemia    iron supp  . Anxiety   . Depression   . Fibroids   . GERD (gastroesophageal reflux disease)    omeprazole  . Headache(784.0)   . Hypertension 2012   Maxide  . S/P LEEP 1992  . Urticaria     Past Surgical History:  Procedure Laterality Date  . Inicision and drainage of thrombosed hemorrhoid  04/06/2018  . LEEP  1998   Abnormal pap  .  SUPRACERVICAL ABDOMINAL HYSTERECTOMY  01/18/2012   Procedure: HYSTERECTOMY SUPRACERVICAL ABDOMINAL with LSO;  Surgeon: Osborne Oman, MD;  Location: [facility];  Service: Gynecology;  Laterality: N/A;  . TUBAL LIGATION  1997  . WISDOM TOOTH EXTRACTION      Current Outpatient Medications  Medication Sig Dispense Refill  . amLODipine (NORVASC) 5 MG tablet Take 1 tablet (5 mg total) by mouth daily. 90 tablet 1  . cetirizine (ZYRTEC) 10 MG tablet Take 10 mg by mouth daily.    . fluticasone (FLONASE) 50 MCG/ACT nasal spray Place 1 spray into both nostrils daily. 16 g 5  . Ibuprofen-Famotidine 800-26.6 MG TABS Take 1 tablet by mouth 3 (three) times daily. 90 tablet 3  . losartan (COZAAR) 25 MG tablet Take 1 tablet (25 mg total) by mouth daily. 30 tablet 5  . omeprazole (PRILOSEC) 20 MG capsule Take 1 capsule (20 mg total) by mouth daily. 90 capsule 1  . saccharomyces boulardii (FLORASTOR) 250 MG capsule Take 250 mg by mouth 2 (two) times daily.    . SUMAtriptan (IMITREX) 25 MG tablet Take 1 tablet (25 mg total) by mouth every 2 (two) hours as needed for migraine. Do not use more than 100mg  in 24hrs 10 tablet 0  . triamcinolone ointment (KENALOG) 0.5 % Apply  1 application topically 2 (two) times daily. You may use for 2 weeks at a time as needed. 30 g 0  . Vitamin D, Ergocalciferol, 2000 units CAPS Take 1 capsule by mouth daily after breakfast. 90 capsule 1   No current facility-administered medications for this visit.      ALLERGIES: Shellfish allergy and Tylox [oxycodone-acetaminophen]  Family History  Problem Relation Age of Onset  . Hypertension Father   . Depression Father   . Diabetes Father   . Alcohol abuse Father   . Colonic polyp Father   . Diabetes Sister   . Diabetes Brother   . Hypertension Mother   . Arthritis Mother   . Pulmonary fibrosis Mother   . Hypertension Sister   . Cancer Paternal Grandmother        breast  . Heart attack Paternal Grandmother   . Allergic rhinitis  Neg Hx   . Angioedema Neg Hx   . Asthma Neg Hx   . Eczema Neg Hx   . Immunodeficiency Neg Hx   . Urticaria Neg Hx     Social History   Socioeconomic History  . Marital status: Married    Spouse name: Not on file  . Number of children: Not on file  . Years of education: 51  . Highest education level: Not on file  Occupational History  . Occupation: MEDICAL ASSISTANT    Employer: [employer]  . Financial resource strain: Not on file  . Food insecurity    Worry: Not on file    Inability: Not on file  . Transportation needs    Medical: Not on file    Non-medical: Not on file  Tobacco Use  . Smoking status: Never Smoker  . Smokeless tobacco: Never Used  Substance and Sexual Activity  . Alcohol use: Yes    Alcohol/week: 1.0 standard drinks    Types: 1 Standard drinks or equivalent per week    Comment: occasional  . Drug use: No  . Sexual activity: Yes    Birth control/protection: Surgical    Comment: Hysterectomy  Lifestyle  . Physical activity    Days per week: Not on file    Minutes per session: Not on file  . Stress: Not on file  Relationships  . Social Herbalist on phone: Not on file    Gets together: Not on file    Attends religious service: Not on file    Active member of club or organization: Not on file    Attends meetings of clubs or organizations: Not on file    Relationship status: Not on file  . Intimate partner violence    Fear of current or ex partner: Not on file    Emotionally abused: Not on file    Physically abused: Not on file    Forced sexual activity: Not on file  Other Topics Concern  . Not on file  Social History Narrative   Regular exercise-no   Caffeine Use-yes    Review of Systems  All other systems reviewed and are negative.   PHYSICAL EXAMINATION:    BP (!) 142/82 (Cuff Size: Large)   Pulse 68   Temp (!) 97.4 F (36.3 C) (Temporal)   Ht 5\' 6"  (1.676 m)   Wt 186 lb 9.6 oz (84.6 kg)   LMP  01/18/2012 (Exact Date)   BMI 30.12 kg/m     General appearance: alert, cooperative and appears stated age  Pelvic US Uterus  absent.  Cervix present.  Right CL collapsed cyst 12 x 16 mm. Left ovary absent.  Small amount free fluid.  ASSESSMENT  Status post supracervical hysterectomy, LSO, right salpingectomy for fibroids. Right ovary remains. Pelvic pain.  Right CL cyst.  No signs of endometriosis.  Negative vaginitis/cervicitis testing.   PLAN  We reviewed ovarian function and formation of CL cysts.  We discussed hormonal contraceptives to control pelvic pain and ovulation if needed.  BV reviewed.  No Rx at this time.  Suggested condom use.  FU prn.   An After Visit Summary was printed and given to the patient.  __15____ minutes face to face time of which over 50% was spent in counseling.

## 2019-06-07 ENCOUNTER — Other Ambulatory Visit: Payer: Self-pay | Admitting: Nurse Practitioner

## 2019-06-07 DIAGNOSIS — G43009 Migraine without aura, not intractable, without status migrainosus: Secondary | ICD-10-CM

## 2019-06-07 MED ORDER — SUMATRIPTAN SUCCINATE 25 MG PO TABS: 25.0000 mg | ORAL_TABLET | ORAL | 0 refills | Status: DC | PRN

## 2019-06-07 NOTE — Telephone Encounter (Signed)
Please advise ok to send in rx.   Last ov was 11/29/2018 and last refill was 10/19/2018--1 tabs

## 2019-08-23 ENCOUNTER — Other Ambulatory Visit: Payer: Self-pay | Admitting: Nurse Practitioner

## 2019-08-23 DIAGNOSIS — Z1231 Encounter for screening mammogram for malignant neoplasm of breast: Secondary | ICD-10-CM

## 2019-08-25 ENCOUNTER — Ambulatory Visit
Admission: RE | Admit: 2019-08-25 | Discharge: 2019-08-25 | Disposition: A | Discharge status: Home / Self Care | Payer: 59 | Source: Ambulatory Visit | Attending: Nurse Practitioner | Admitting: Nurse Practitioner

## 2019-08-25 ENCOUNTER — Other Ambulatory Visit: Payer: Self-pay

## 2019-08-25 DIAGNOSIS — Z1231 Encounter for screening mammogram for malignant neoplasm of breast: Secondary | ICD-10-CM

## 2019-08-28 ENCOUNTER — Other Ambulatory Visit: Payer: Self-pay

## 2019-08-28 ENCOUNTER — Other Ambulatory Visit: Payer: Self-pay | Admitting: Nurse Practitioner

## 2019-08-28 DIAGNOSIS — R928 Other abnormal and inconclusive findings on diagnostic imaging of breast: Secondary | ICD-10-CM

## 2019-08-29 ENCOUNTER — Other Ambulatory Visit: Payer: Self-pay | Admitting: Nurse Practitioner

## 2019-08-29 ENCOUNTER — Encounter: Payer: Self-pay | Admitting: Nurse Practitioner

## 2019-08-29 ENCOUNTER — Ambulatory Visit (INDEPENDENT_AMBULATORY_CARE_PROVIDER_SITE_OTHER): Payer: 59 | Admitting: Nurse Practitioner

## 2019-08-29 VITALS — BP 120/80 | HR 72 | Temp 98.0°F | Ht 66.0 in | Wt 186.2 lb

## 2019-08-29 DIAGNOSIS — K5904 Chronic idiopathic constipation: Secondary | ICD-10-CM | POA: Diagnosis not present

## 2019-08-29 DIAGNOSIS — Z0001 Encounter for general adult medical examination with abnormal findings: Secondary | ICD-10-CM

## 2019-08-29 DIAGNOSIS — I10 Essential (primary) hypertension: Secondary | ICD-10-CM | POA: Diagnosis not present

## 2019-08-29 DIAGNOSIS — R739 Hyperglycemia, unspecified: Secondary | ICD-10-CM | POA: Diagnosis not present

## 2019-08-29 DIAGNOSIS — Z13220 Encounter for screening for lipoid disorders: Secondary | ICD-10-CM

## 2019-08-29 DIAGNOSIS — Z136 Encounter for screening for cardiovascular disorders: Secondary | ICD-10-CM | POA: Diagnosis not present

## 2019-08-29 DIAGNOSIS — K219 Gastro-esophageal reflux disease without esophagitis: Secondary | ICD-10-CM

## 2019-08-29 LAB — COMPREHENSIVE METABOLIC PANEL
ALT: 11 U/L (ref 0–35)
AST: 16 U/L (ref 0–37)
Albumin: 3.8 g/dL (ref 3.5–5.2)
Alkaline Phosphatase: 52 U/L (ref 39–117)
BUN: 9 mg/dL (ref 6–23)
CO2: 27 mEq/L (ref 19–32)
Calcium: 8.8 mg/dL (ref 8.4–10.5)
Chloride: 104 mEq/L (ref 96–112)
Creatinine, Ser: 0.87 mg/dL (ref 0.40–1.20)
GFR: 83.43 mL/min (ref >60.00)
Glucose, Bld: 99 mg/dL (ref 70–99)
Potassium: 4.3 mEq/L (ref 3.5–5.1)
Sodium: 136 mEq/L (ref 135–145)
Total Bilirubin: 0.5 mg/dL (ref 0.2–1.2)
Total Protein: 6.8 g/dL (ref 6.0–8.3)

## 2019-08-29 LAB — CBC
HCT: 38.9 % (ref 36.0–46.0)
Hemoglobin: 13.1 g/dL (ref 12.0–15.0)
MCHC: 33.6 g/dL (ref 30.0–36.0)
MCV: 91.9 fl (ref 78.0–100.0)
Platelets: 273 10*3/uL (ref 150.0–400.0)
RBC: 4.23 Mil/uL (ref 3.87–5.11)
RDW: 13.2 % (ref 11.5–15.5)
WBC: 5.6 10*3/uL (ref 4.0–10.5)

## 2019-08-29 LAB — LIPID PANEL
Cholesterol: 168 mg/dL (ref 0–200)
HDL: 47.8 mg/dL (ref >39.00)
LDL Cholesterol: 107 mg/dL — ABNORMAL HIGH (ref 0–99)
NonHDL: 120.22
Total CHOL/HDL Ratio: 4
Triglycerides: 67 mg/dL (ref 0.0–149.0)
VLDL: 13.4 mg/dL (ref 0.0–40.0)

## 2019-08-29 LAB — TSH: TSH: 0.48 u[IU]/mL (ref 0.35–4.50)

## 2019-08-29 LAB — HEMOGLOBIN A1C: Hgb A1c MFr Bld: 6 % (ref 4.6–6.5)

## 2019-08-29 MED ORDER — LOSARTAN POTASSIUM 25 MG PO TABS: 25.0000 mg | ORAL_TABLET | Freq: Every day | ORAL | 3 refills | Status: DC

## 2019-08-29 MED ORDER — AMLODIPINE BESYLATE 5 MG PO TABS: 5.0000 mg | ORAL_TABLET | Freq: Every day | ORAL | 3 refills | Status: DC

## 2019-08-29 MED ORDER — LUBIPROSTONE 8 MCG PO CAPS: 8.0000 ug | ORAL_CAPSULE | Freq: Two times a day (BID) | ORAL | 5 refills | Status: DC

## 2019-08-29 NOTE — Assessment & Plan Note (Signed)
Chronic, waxing and waning, no change with OTC stool softener and increase fiber intake, unable to afford linzess.  Start amitiza BID Consider referral to GI if no improvement. Maintain adequate oral hydration and high fiber diet

## 2019-08-29 NOTE — Assessment & Plan Note (Signed)
BP at goal with amlodipine and losartan BP Readings from Last 3 Encounters:  08/29/19 120/80  04/13/19 (!) 142/82  03/31/19 122/80

## 2019-08-29 NOTE — Patient Instructions (Addendum)
linzess is not covered by your insurance Amitiza sent in place for constipation If no improvement, we need to refer to GI. Maintain adequate oral hydration and high fiber diet.  Go to lab for blood draw.  With Hx of hysterectomy, please verify with GYN if you meed to continue PAP smears?   Preventive Care 40-50 Years Old, Female Preventive care refers to visits with your health care provider and lifestyle choices that can promote health and wellness. This includes:  A yearly physical exam. This may also be called an annual well check.  Regular dental visits and eye exams.  Immunizations.  Screening for certain conditions.  Healthy lifestyle choices, such as eating a healthy diet, getting regular exercise, not using drugs or products that contain nicotine and tobacco, and limiting alcohol use. What can I expect for my preventive care visit? Physical exam Your health care provider will check your:  Height and weight. This may be used to calculate body mass index (BMI), which tells if you are at a healthy weight.  Heart rate and blood pressure.  Skin for abnormal spots. Counseling Your health care provider may ask you questions about your:  Alcohol, tobacco, and drug use.  Emotional well-being.  Home and relationship well-being.  Sexual activity.  Eating habits.  Work and work Statistician.  Method of birth control.  Menstrual cycle.  Pregnancy history. What immunizations do I need?  Influenza (flu) vaccine  This is recommended every year. Tetanus, diphtheria, and pertussis (Tdap) vaccine  You may need a Td booster every 10 years. Varicella (chickenpox) vaccine  You may need this if you have not been vaccinated. Zoster (shingles) vaccine  You may need this after age 28. Measles, mumps, and rubella (MMR) vaccine  You may need at least one dose of MMR if you were born in 1957 or later. You may also need a second dose. Pneumococcal conjugate (PCV13)  vaccine  You may need this if you have certain conditions and were not previously vaccinated. Pneumococcal polysaccharide (PPSV23) vaccine  You may need one or two doses if you smoke cigarettes or if you have certain conditions. Meningococcal conjugate (MenACWY) vaccine  You may need this if you have certain conditions. Hepatitis A vaccine  You may need this if you have certain conditions or if you travel or work in places where you may be exposed to hepatitis A. Hepatitis B vaccine  You may need this if you have certain conditions or if you travel or work in places where you may be exposed to hepatitis B. Haemophilus influenzae type b (Hib) vaccine  You may need this if you have certain conditions. Human papillomavirus (HPV) vaccine  If recommended by your health care provider, you may need three doses over 6 months. You may receive vaccines as individual doses or as more than one vaccine together in one shot (combination vaccines). Talk with your health care provider about the risks and benefits of combination vaccines. What tests do I need? Blood tests  Lipid and cholesterol levels. These may be checked every 5 years, or more frequently if you are over 50 years old.  Hepatitis C test.  Hepatitis B test. Screening  Lung cancer screening. You may have this screening every year starting at age 102 if you have a 30-pack-year history of smoking and currently smoke or have quit within the past 15 years.  Colorectal cancer screening. All adults should have this screening starting at age 33 and continuing until age 64. Your health care  provider may recommend screening at age 28 if you are at increased risk. You will have tests every 1-10 years, depending on your results and the type of screening test.  Diabetes screening. This is done by checking your blood sugar (glucose) after you have not eaten for a while (fasting). You may have this done every 1-3 years.  Mammogram. This may be  done every 1-2 years. Talk with your health care provider about when you should start having regular mammograms. This may depend on whether you have a family history of breast cancer.  BRCA-related cancer screening. This may be done if you have a family history of breast, ovarian, tubal, or peritoneal cancers.  Pelvic exam and Pap test. This may be done every 3 years starting at age 50. Starting at age 53, this may be done every 5 years if you have a Pap test in combination with an HPV test. Other tests  Sexually transmitted disease (STD) testing.  Bone density scan. This is done to screen for osteoporosis. You may have this scan if you are at high risk for osteoporosis. Follow these instructions at home: Eating and drinking  Eat a diet that includes fresh fruits and vegetables, whole grains, lean protein, and low-fat dairy.  Take vitamin and mineral supplements as recommended by your health care provider.  Do not drink alcohol if: ? Your health care provider tells you not to drink. ? You are pregnant, may be pregnant, or are planning to become pregnant.  If you drink alcohol: ? Limit how much you have to 0-1 drink a day. ? Be aware of how much alcohol is in your drink. In the [place] one drink equals one 12 oz bottle of beer (355 mL), one 5 oz glass of wine (148 mL), or one 1 oz glass of hard liquor (44 mL). Lifestyle  Take daily care of your teeth and gums.  Stay active. Exercise for at least 30 minutes on 5 or more days each week.  Do not use any products that contain nicotine or tobacco, such as cigarettes, e-cigarettes, and chewing tobacco. If you need help quitting, ask your health care provider.  If you are sexually active, practice safe sex. Use a condom or other form of birth control (contraception) in order to prevent pregnancy and STIs (sexually transmitted infections).  If told by your health care provider, take low-dose aspirin daily starting at age 42. What's  next?  Visit your health care provider once a year for a well check visit.  Ask your health care provider how often you should have your eyes and teeth checked.  Stay up to date on all vaccines. This information is not intended to replace advice given to you by your health care provider. Make sure you discuss any questions you have with your health care provider. Document Revised: 01/06/2018 Document Reviewed: 01/06/2018 Elsevier Patient Education  2020 [employer].

## 2019-08-29 NOTE — Progress Notes (Signed)
Subjective:    Patient ID: Abigail [name], female    DOB: 1971-01-18, 49 y.o.   MRN: [id number]  Patient presents today for complete physical and eval of constipation and HTN  HPI  HTN:  BP at goal with amlodipine and losartan BP Readings from Last 3 Encounters:  08/29/19 120/80  04/13/19 (!) 142/82  03/31/19 122/80   Constipation: Chronic, waxing and waning, no change with OTC stool softener and increase fiber intake, unable to afford linzess.  Sexual History (orientation,birth control, marital status, STD):s/p hysterectomy, up to date with mammogram  Depression/Suicide: Depression screen [facility] 2/9 08/29/2019 08/23/2018 07/27/2017 03/19/2017 08/20/2016 06/17/2012  Decreased Interest 0 0 0 0 0 0  Down, Depressed, Hopeless 0 0 0 0 0 0  PHQ - 2 Score 0 0 0 0 0 0  Altered sleeping - - 1 - - -  Tired, decreased energy - - 0 - - -  Change in appetite - - 0 - - -  Feeling bad or failure about yourself  - - 0 - - -  Trouble concentrating - - 0 - - -  Moving slowly or fidgety/restless - - 0 - - -  Suicidal thoughts - - 0 - - -  PHQ-9 Score - - 1 - - -   Vision:will schedule  Dental:will schedule  Immunizations: (TDAP, Hep C screen, Pneumovax, Influenza, zoster)  Health Maintenance  Topic Date Due  . Pap Smear  07/29/2019  . COVID-19 Vaccine (2 - Pfizer 2-dose series) 08/30/2019  . Flu Shot  12/10/2019  . Mammogram  08/24/2020  . Tetanus Vaccine  04/10/2024  . HIV Screening  Completed   Diet:regular.  Weight:  Wt Readings from Last 3 Encounters:  08/29/19 186 lb 3.2 oz (84.5 kg)  04/13/19 186 lb 9.6 oz (84.6 kg)  03/31/19 184 lb 3.2 oz (83.6 kg)   Exercise:none  Fall Risk: Fall Risk  08/29/2019 08/23/2018 03/19/2017 08/20/2016 06/17/2012  Falls in the past year? 0 0 No No No  Number falls in past yr: 0 - - - -  Injury with Fall? 0 - - - -   Advanced Directive: Advanced Directives 01/24/2016  Does Patient Have a Medical Advance Directive? No  Would patient like information  on creating a medical advance directive? No - patient declined information     Medications and allergies reviewed with patient and updated if appropriate.  Patient Active Problem List   Diagnosis Date Noted  . Chronic idiopathic constipation 08/29/2019  . Intermittent palpitations 11/29/2018  . Migraine without aura and without status migrainosus, not intractable 10/19/2018  . Chronic right-sided low back pain without sciatica 12/13/2017  . Vitamin D insufficiency 07/27/2017  . Seasonal allergic rhinitis 07/27/2017  . Gallop rhythm 06/25/2017  . Generalized anxiety disorder 04/10/2016  . Hyperglycemia 04/10/2016  . GERD (gastroesophageal reflux disease) 04/10/2016  . Environmental allergies 06/17/2012  . S/p Abdominal Supracervical Hysterectomy, Left Salpingoohorectomy, Right Salpingectomy on 01/18/12 01/18/2012  . HTN (hypertension) 10/29/2011    Current Outpatient Medications on File Prior to Visit  Medication Sig Dispense Refill  . cetirizine (ZYRTEC) 10 MG tablet Take 10 mg by mouth daily.    . fluticasone (FLONASE) 50 MCG/ACT nasal spray Place 1 spray into both nostrils daily. 16 g 5  . Ibuprofen-Famotidine 800-26.6 MG TABS Take 1 tablet by mouth 3 (three) times daily. 90 tablet 3  . omeprazole (PRILOSEC) 20 MG capsule Take 1 capsule (20 mg total) by mouth daily. 90 capsule 1  . SUMAtriptan (IMITREX)  25 MG tablet Take 1 tablet (25 mg total) by mouth every 2 (two) hours as needed for migraine. Do not use more than 100mg  in 24hrs 10 tablet 0  . triamcinolone ointment (KENALOG) 0.5 % Apply 1 application topically 2 (two) times daily. You may use for 2 weeks at a time as needed. 30 g 0  . Vitamin D, Ergocalciferol, 2000 units CAPS Take 1 capsule by mouth daily after breakfast. 90 capsule 1  . saccharomyces boulardii (FLORASTOR) 250 MG capsule Take 250 mg by mouth 2 (two) times daily.     No current facility-administered medications on file prior to visit.    Past Medical History:    Diagnosis Date  . Anemia    iron supp  . Anxiety   . Depression   . Fibroids   . GERD (gastroesophageal reflux disease)    omeprazole  . Headache(784.0)   . Hypertension 2012   Maxide  . S/P LEEP 1992  . Urticaria     Past Surgical History:  Procedure Laterality Date  . Inicision and drainage of thrombosed hemorrhoid  04/06/2018  . LEEP  1998   Abnormal pap  . SUPRACERVICAL ABDOMINAL HYSTERECTOMY  01/18/2012   Procedure: HYSTERECTOMY SUPRACERVICAL ABDOMINAL with LSO;  Surgeon: Osborne Oman, MD;  Location: [facility];  Service: Gynecology;  Laterality: N/A;  . TUBAL LIGATION  1997  . WISDOM TOOTH EXTRACTION      Social History   Socioeconomic History  . Marital status: Married    Spouse name: Not on file  . Number of children: Not on file  . Years of education: 41  . Highest education level: Not on file  Occupational History  . Occupation: MEDICAL ASSISTANT    Employer: [employer]  Tobacco Use  . Smoking status: Never Smoker  . Smokeless tobacco: Never Used  Substance and Sexual Activity  . Alcohol use: Yes    Alcohol/week: 1.0 standard drinks    Types: 1 Standard drinks or equivalent per week    Comment: occasional  . Drug use: No  . Sexual activity: Yes    Birth control/protection: Surgical    Comment: Hysterectomy  Other Topics Concern  . Not on file  Social History Narrative   Regular exercise-no   Caffeine Use-yes   Social Determinants of Health   Financial Resource Strain:   . Difficulty of Paying Living Expenses:   Food Insecurity:   . Worried About Charity fundraiser in the Last Year:   . Arboriculturist in the Last Year:   Transportation Needs:   . Film/video editor (Medical):   Marland Kitchen Lack of Transportation (Non-Medical):   Physical Activity:   . Days of Exercise per Week:   . Minutes of Exercise per Session:   Stress:   . Feeling of Stress :   Social Connections:   . Frequency of Communication with Friends and Family:   .  Frequency of Social Gatherings with Friends and Family:   . Attends Religious Services:   . Active Member of Clubs or Organizations:   . Attends Archivist Meetings:   Marland Kitchen Marital Status:     Family History  Problem Relation Age of Onset  . Hypertension Father   . Depression Father   . Diabetes Father   . Alcohol abuse Father   . Colonic polyp Father   . Diabetes Sister   . Diabetes Brother   . Hypertension Mother   . Arthritis Mother   .  Pulmonary fibrosis Mother   . Hypertension Sister   . Cancer Paternal Grandmother        breast  . Heart attack Paternal Grandmother   . Cancer Daughter        breast  . Allergic rhinitis Neg Hx   . Angioedema Neg Hx   . Asthma Neg Hx   . Eczema Neg Hx   . Immunodeficiency Neg Hx   . Urticaria Neg Hx         Review of Systems  Constitutional: Negative for fever, malaise/fatigue and weight loss.  HENT: Negative for congestion and sore throat.   Eyes:       Negative for visual changes  Respiratory: Negative for cough and shortness of breath.   Cardiovascular: Negative for chest pain, palpitations and leg swelling.  Gastrointestinal: Positive for constipation. Negative for abdominal pain, blood in stool, diarrhea, heartburn, melena and nausea.  Genitourinary: Negative for dysuria, frequency and urgency.  Musculoskeletal: Negative for falls, joint pain and myalgias.  Skin: Negative for rash.  Neurological: Negative for dizziness, sensory change and headaches.  Endo/Heme/Allergies: Does not bruise/bleed easily.  Psychiatric/Behavioral: Negative for depression, substance abuse and suicidal ideas. The patient is not nervous/anxious.    Objective:   Vitals:   08/29/19 1028  BP: 120/80  Pulse: 72  Temp: 98 F (36.7 C)  SpO2: 98%   Body mass index is 30.05 kg/m.  Physical Examination:  Physical Exam Vitals reviewed.  Constitutional:      General: She is not in acute distress.    Appearance: She is well-developed.  She is obese.  HENT:     Right Ear: Tympanic membrane, ear canal and external ear normal.     Left Ear: Tympanic membrane, ear canal and external ear normal.  Eyes:     Extraocular Movements: Extraocular movements intact.     Conjunctiva/sclera: Conjunctivae normal.  Neck:     Thyroid: No thyroid mass, thyromegaly or thyroid tenderness.  Cardiovascular:     Rate and Rhythm: Normal rate and regular rhythm.     Pulses: Normal pulses.     Heart sounds: Gallop present.   Pulmonary:     Effort: Pulmonary effort is normal. No respiratory distress.     Breath sounds: Normal breath sounds.  Chest:     Chest wall: No tenderness.  Abdominal:     General: Bowel sounds are normal.     Palpations: Abdomen is soft.     Tenderness: There is no abdominal tenderness.  Genitourinary:    Comments: Deferred breast and pelvic exam to GYN per patient Musculoskeletal:        General: Normal range of motion.     Cervical back: Normal range of motion and neck supple.     Right lower leg: No edema.     Left lower leg: No edema.  Lymphadenopathy:     Cervical: No cervical adenopathy.  Skin:    General: Skin is warm and dry.     Findings: No rash.  Neurological:     Mental Status: She is alert and oriented to person, place, and time.     Deep Tendon Reflexes: Reflexes are normal and symmetric.  Psychiatric:        Mood and Affect: Mood normal.        Behavior: Behavior normal.     ASSESSMENT and PLAN: This visit occurred during the SARS-CoV-2 public health emergency.  Safety protocols were in place, including screening questions prior to the visit, additional usage  of staff PPE, and extensive cleaning of exam room while observing appropriate contact time as indicated for disinfecting solutions.   Charity was seen today for annual exam.  Diagnoses and all orders for this visit:  Encounter for preventative adult health care exam with abnormal findings -     CBC -     Comprehensive metabolic  panel -     Lipid panel -     TSH  Essential hypertension -     amLODipine (NORVASC) 5 MG tablet; Take 1 tablet (5 mg total) by mouth daily. -     losartan (COZAAR) 25 MG tablet; Take 1 tablet (25 mg total) by mouth daily.  Hyperglycemia -     Hemoglobin A1c  Encounter for lipid screening for cardiovascular disease -     Lipid panel  Chronic idiopathic constipation -     lubiprostone (AMITIZA) 8 MCG capsule; Take 1 capsule (8 mcg total) by mouth 2 (two) times daily with a meal.    Chronic idiopathic constipation Chronic, waxing and waning, no change with OTC stool softener and increase fiber intake, unable to afford linzess.  Start amitiza BID Consider referral to GI if no improvement. Maintain adequate oral hydration and high fiber diet  HTN (hypertension) BP at goal with amlodipine and losartan BP Readings from Last 3 Encounters:  08/29/19 120/80  04/13/19 (!) 142/82  03/31/19 122/80       Problem List Items Addressed This Visit      Cardiovascular and Mediastinum   HTN (hypertension)    BP at goal with amlodipine and losartan BP Readings from Last 3 Encounters:  08/29/19 120/80  04/13/19 (!) 142/82  03/31/19 122/80        Relevant Medications   amLODipine (NORVASC) 5 MG tablet   losartan (COZAAR) 25 MG tablet     Digestive   Chronic idiopathic constipation    Chronic, waxing and waning, no change with OTC stool softener and increase fiber intake, unable to afford linzess.  Start amitiza BID Consider referral to GI if no improvement. Maintain adequate oral hydration and high fiber diet      Relevant Medications   lubiprostone (AMITIZA) 8 MCG capsule     Other   Hyperglycemia   Relevant Orders   Hemoglobin A1c    Other Visit Diagnoses    Encounter for preventative adult health care exam with abnormal findings    -  Primary   Relevant Orders   CBC   Comprehensive metabolic panel   Lipid panel   TSH   Encounter for lipid screening for  cardiovascular disease       Relevant Orders   Lipid panel      Follow up: Return in about 6 months (around 02/28/2020) for HTN.  Wilfred Lacy, NP

## 2019-09-01 ENCOUNTER — Telehealth: Payer: Self-pay | Admitting: Nurse Practitioner

## 2019-09-01 NOTE — Telephone Encounter (Signed)
Received a message from [place] in [place] stating insurance is not going to cover for lubiprostone (AMITIZA) 8 MCG capsule but they said alternative on Lactulose sol gm.   Please advise

## 2019-09-01 NOTE — Telephone Encounter (Signed)
Or start PA  ID [id number]

## 2019-09-02 NOTE — Telephone Encounter (Signed)
Start PA.

## 2019-09-05 NOTE — Telephone Encounter (Signed)
PA started, waiting for result  Abigail [name] (Key: [id number])  [facility]  9999 RX GRP Samson Frederic

## 2019-09-05 NOTE — Telephone Encounter (Signed)
PA approved, pharmacy notified.

## 2019-09-06 ENCOUNTER — Other Ambulatory Visit: Payer: Self-pay | Admitting: Nurse Practitioner

## 2019-09-06 ENCOUNTER — Other Ambulatory Visit: Payer: Self-pay

## 2019-09-06 ENCOUNTER — Ambulatory Visit
Admission: RE | Admit: 2019-09-06 | Discharge: 2019-09-06 | Disposition: A | Discharge status: Home / Self Care | Payer: 59 | Source: Ambulatory Visit | Attending: Nurse Practitioner | Admitting: Nurse Practitioner

## 2019-09-06 DIAGNOSIS — R2232 Localized swelling, mass and lump, left upper limb: Secondary | ICD-10-CM

## 2019-09-06 DIAGNOSIS — R928 Other abnormal and inconclusive findings on diagnostic imaging of breast: Secondary | ICD-10-CM

## 2019-09-20 ENCOUNTER — Ambulatory Visit: Payer: 59 | Admitting: Nurse Practitioner

## 2019-09-20 ENCOUNTER — Encounter: Payer: Self-pay | Admitting: Nurse Practitioner

## 2019-09-20 ENCOUNTER — Other Ambulatory Visit: Payer: Self-pay

## 2019-09-20 VITALS — BP 138/88 | HR 67 | Temp 96.2°F | Ht 66.0 in | Wt 190.4 lb

## 2019-09-20 DIAGNOSIS — G43009 Migraine without aura, not intractable, without status migrainosus: Secondary | ICD-10-CM

## 2019-09-20 DIAGNOSIS — M5441 Lumbago with sciatica, right side: Secondary | ICD-10-CM | POA: Diagnosis not present

## 2019-09-20 DIAGNOSIS — G8929 Other chronic pain: Secondary | ICD-10-CM

## 2019-09-20 MED ORDER — CYCLOBENZAPRINE HCL 5 MG PO TABS: 5.0000 mg | ORAL_TABLET | Freq: Every day | ORAL | 0 refills | Status: DC

## 2019-09-20 MED ORDER — PREDNISONE 10 MG (21) PO TBPK: ORAL_TABLET | ORAL | 0 refills | Status: DC

## 2019-09-20 MED ORDER — SUMATRIPTAN SUCCINATE 25 MG PO TABS: 25.0000 mg | ORAL_TABLET | ORAL | 0 refills | Status: DC | PRN

## 2019-09-20 NOTE — Progress Notes (Signed)
Subjective:  Patient ID: Abigail [name], female    DOB: 01-22-1971  Age: 49 y.o. MRN: [id number]  CC: Pain (pain in right leg that won't go away//2 to 3 months won't go away-maybe sciatica pain//pt states she needs imitrex refill)  Back Pain This is a chronic problem. The current episode started more than 1 month ago. The problem occurs constantly. The problem is unchanged. The pain is present in the lumbar spine and gluteal (and right hamstring muscle). The quality of the pain is described as aching, cramping and shooting. The pain radiates to the right thigh. The pain is moderate. The pain is the same all the time. The symptoms are aggravated by sitting and lying down. Associated symptoms include leg pain. Pertinent negatives include no abdominal pain, bladder incontinence, bowel incontinence, chest pain, dysuria, fever, headaches, numbness, paresis, paresthesias, perianal numbness, tingling, weakness or weight loss. Risk factors include obesity and sedentary lifestyle. She has tried NSAIDs for the symptoms. The treatment provided no relief.  Lumbar x-ray done 2018: scoliosis No trauma of injury.  Reviewed past Medical, Social and Family history today.  Outpatient Medications Prior to Visit  Medication Sig Dispense Refill  . amLODipine (NORVASC) 5 MG tablet Take 1 tablet (5 mg total) by mouth daily. 90 tablet 3  . cetirizine (ZYRTEC) 10 MG tablet Take 10 mg by mouth daily.    . fluticasone (FLONASE) 50 MCG/ACT nasal spray Place 1 spray into both nostrils daily. 16 g 5  . Ibuprofen-Famotidine 800-26.6 MG TABS Take 1 tablet by mouth 3 (three) times daily. 90 tablet 3  . losartan (COZAAR) 25 MG tablet Take 1 tablet (25 mg total) by mouth daily. 90 tablet 3  . omeprazole (PRILOSEC) 20 MG capsule TAKE 1 CAPSULE(20 MG) BY MOUTH DAILY 90 capsule 1  . triamcinolone ointment (KENALOG) 0.5 % Apply 1 application topically 2 (two) times daily. You may use for 2 weeks at a time as needed. 30 g 0  .  Vitamin D, Ergocalciferol, 2000 units CAPS Take 1 capsule by mouth daily after breakfast. 90 capsule 1  . SUMAtriptan (IMITREX) 25 MG tablet Take 1 tablet (25 mg total) by mouth every 2 (two) hours as needed for migraine. Do not use more than 100mg  in 24hrs 10 tablet 0  . lubiprostone (AMITIZA) 8 MCG capsule Take 1 capsule (8 mcg total) by mouth 2 (two) times daily with a meal. (Patient not taking: Reported on 09/20/2019) 60 capsule 5  . saccharomyces boulardii (FLORASTOR) 250 MG capsule Take 250 mg by mouth 2 (two) times daily.     No facility-administered medications prior to visit.    ROS See HPI  Objective:  BP 138/88   Pulse 67   Temp (!) 96.2 F (35.7 C) (Tympanic)   Ht 5\' 6"  (1.676 m)   Wt 190 lb 6.4 oz (86.4 kg)   LMP 01/18/2012 (Exact Date)   SpO2 98%   BMI 30.73 kg/m   BP Readings from Last 3 Encounters:  09/20/19 138/88  08/29/19 120/80  04/13/19 (!) 142/82    Wt Readings from Last 3 Encounters:  09/20/19 190 lb 6.4 oz (86.4 kg)  08/29/19 186 lb 3.2 oz (84.5 kg)  04/13/19 186 lb 9.6 oz (84.6 kg)   Physical Exam Vitals reviewed.  Constitutional:      Appearance: She is obese.  Cardiovascular:     Rate and Rhythm: Normal rate.     Pulses: Normal pulses.  Pulmonary:     Effort: Pulmonary effort is normal.  Musculoskeletal:     Lumbar back: Normal.     Right hip: Tenderness present.     Right upper leg: Normal.     Right lower leg: Normal. No edema.     Left lower leg: No edema.     Right ankle: Normal.     Right Achilles Tendon: Normal.     Right foot: Normal.  Skin:    Findings: No erythema or rash.  Neurological:     Mental Status: She is alert and oriented to person, place, and time.     Lab Results  Component Value Date   WBC 5.6 08/29/2019   HGB 13.1 08/29/2019   HCT 38.9 08/29/2019   PLT 273.0 08/29/2019   GLUCOSE 99 08/29/2019   CHOL 168 08/29/2019   TRIG 67.0 08/29/2019   HDL 47.80 08/29/2019   LDLCALC 107 (H) 08/29/2019   ALT 11  08/29/2019   AST 16 08/29/2019   NA 136 08/29/2019   K 4.3 08/29/2019   CL 104 08/29/2019   CREATININE 0.87 08/29/2019   BUN 9 08/29/2019   CO2 27 08/29/2019   TSH 0.48 08/29/2019   HGBA1C 6.0 08/29/2019    Assessment & Plan:  This visit occurred during the SARS-CoV-2 public health emergency.  Safety protocols were in place, including screening questions prior to the visit, additional usage of staff PPE, and extensive cleaning of exam room while observing appropriate contact time as indicated for disinfecting solutions.   Jaynie was seen today for pain.  Diagnoses and all orders for this visit:  Chronic right-sided low back pain with right-sided sciatica -     predniSONE (STERAPRED UNI-PAK 21 TAB) 10 MG (21) TBPK tablet; As directed on package -     cyclobenzaprine (FLEXERIL) 5 MG tablet; Take 1-2 tablets (5-10 mg total) by mouth at bedtime. -     Ambulatory referral to Physical Therapy  Migraine without aura and without status migrainosus, not intractable -     SUMAtriptan (IMITREX) 25 MG tablet; Take 1 tablet (25 mg total) by mouth every 2 (two) hours as needed for migraine. Do not use more than 100mg  in 24hrs   I am having Abigail [name] start on predniSONE and cyclobenzaprine. I am also having her maintain her cetirizine, saccharomyces boulardii, Vitamin D (Ergocalciferol), fluticasone, Ibuprofen-Famotidine, triamcinolone ointment, lubiprostone, amLODipine, losartan, omeprazole, and SUMAtriptan.  Meds ordered this encounter  Medications  . SUMAtriptan (IMITREX) 25 MG tablet    Sig: Take 1 tablet (25 mg total) by mouth every 2 (two) hours as needed for migraine. Do not use more than 100mg  in 24hrs    Dispense:  10 tablet    Refill:  0    Order Specific Question:   Supervising Provider    Answer:   Ronnald Nian [id number]  . predniSONE (STERAPRED UNI-PAK 21 TAB) 10 MG (21) TBPK tablet    Sig: As directed on package    Dispense:  21 tablet    Refill:  0    Order  Specific Question:   Supervising Provider    Answer:   Ronnald Nian [id number]  . cyclobenzaprine (FLEXERIL) 5 MG tablet    Sig: Take 1-2 tablets (5-10 mg total) by mouth at bedtime.    Dispense:  20 tablet    Refill:  0    Order Specific Question:   Supervising Provider    Answer:   Ronnald Nian [id number]    Problem List Items Addressed This Visit  Cardiovascular and Mediastinum   Migraine without aura and without status migrainosus, not intractable   Relevant Medications   SUMAtriptan (IMITREX) 25 MG tablet   cyclobenzaprine (FLEXERIL) 5 MG tablet    Other Visit Diagnoses    Chronic right-sided low back pain with right-sided sciatica    -  Primary   Relevant Medications   predniSONE (STERAPRED UNI-PAK 21 TAB) 10 MG (21) TBPK tablet   cyclobenzaprine (FLEXERIL) 5 MG tablet   Other Relevant Orders   Ambulatory referral to Physical Therapy       Follow-up: Return if symptoms worsen or fail to improve.  Wilfred Lacy, NP

## 2019-09-20 NOTE — Patient Instructions (Addendum)
You will be contacted to schedule appt with PT Start oral prednisone and flexeril Do not take NSAIDs at same time with prednisone due to risk of GI irritation. Will order MRI lumbar spine if no improvement after completion of PT  Sciatica Rehab Ask your health care provider which exercises are safe for you. Do exercises exactly as told by your health care provider and adjust them as directed. It is normal to feel mild stretching, pulling, tightness, or discomfort as you do these exercises. Stop right away if you feel sudden pain or your pain gets worse. Do not begin these exercises until told by your health care provider. Stretching and range-of-motion exercises These exercises warm up your muscles and joints and improve the movement and flexibility of your hips and back. These exercises also help to relieve pain, numbness, and tingling. Sciatic nerve glide 1. Sit in a chair with your head facing down toward your chest. Place your hands behind your back. Let your shoulders slump forward. 2. Slowly straighten one of your legs while you tilt your head back as if you are looking toward the ceiling. Only straighten your leg as far as you can without making your symptoms worse. 3. Hold this position for __________ seconds. 4. Slowly return to the starting position. 5. Repeat with your other leg. Repeat __________ times. Complete this exercise __________ times a day. Knee to chest with hip adduction and internal rotation  1. Lie on your back on a firm surface with both legs straight. 2. Bend one of your knees and move it up toward your chest until you feel a gentle stretch in your lower back and buttock. Then, move your knee toward the shoulder that is on the opposite side from your leg. This is hip adduction and internal rotation. ? Hold your leg in this position by holding on to the front of your knee. 3. Hold this position for __________ seconds. 4. Slowly return to the starting  position. 5. Repeat with your other leg. Repeat __________ times. Complete this exercise __________ times a day. Prone extension on elbows  1. Lie on your abdomen on a firm surface. A bed may be too soft for this exercise. 2. Prop yourself up on your elbows. 3. Use your arms to help lift your chest up until you feel a gentle stretch in your abdomen and your lower back. ? This will place some of your body weight on your elbows. If this is uncomfortable, try stacking pillows under your chest. ? Your hips should stay down, against the surface that you are lying on. Keep your hip and back muscles relaxed. 4. Hold this position for __________ seconds. 5. Slowly relax your upper body and return to the starting position. Repeat __________ times. Complete this exercise __________ times a day. Strengthening exercises These exercises build strength and endurance in your back. Endurance is the ability to use your muscles for a long time, even after they get tired. Pelvic tilt This exercise strengthens the muscles that lie deep in the abdomen. 1. Lie on your back on a firm surface. Bend your knees and keep your feet flat on the floor. 2. Tense your abdominal muscles. Tip your pelvis up toward the ceiling and flatten your lower back into the floor. ? To help with this exercise, you may place a small towel under your lower back and try to push your back into the towel. 3. Hold this position for __________ seconds. 4. Let your muscles relax completely before you  repeat this exercise. Repeat __________ times. Complete this exercise __________ times a day. Alternating arm and leg raises  1. Get on your hands and knees on a firm surface. If you are on a hard floor, you may want to use padding, such as an exercise mat, to cushion your knees. 2. Line up your arms and legs. Your hands should be directly below your shoulders, and your knees should be directly below your hips. 3. Lift your left leg behind you.  At the same time, raise your right arm and straighten it in front of you. ? Do not lift your leg higher than your hip. ? Do not lift your arm higher than your shoulder. ? Keep your abdominal and back muscles tight. ? Keep your hips facing the ground. ? Do not arch your back. ? Keep your balance carefully, and do not hold your breath. 4. Hold this position for __________ seconds. 5. Slowly return to the starting position. 6. Repeat with your right leg and your left arm. Repeat __________ times. Complete this exercise __________ times a day. Posture and body mechanics Good posture and healthy body mechanics can help to relieve stress in your body's tissues and joints. Body mechanics refers to the movements and positions of your body while you do your daily activities. Posture is part of body mechanics. Good posture means:  Your spine is in its natural S-curve position (neutral).  Your shoulders are pulled back slightly.  Your head is not tipped forward. Follow these guidelines to improve your posture and body mechanics in your everyday activities. Standing   When standing, keep your spine neutral and your feet about hip width apart. Keep a slight bend in your knees. Your ears, shoulders, and hips should line up.  When you do a task in which you stand in one place for a long time, place one foot up on a stable object that is 2-4 inches (5-10 cm) high, such as a footstool. This helps keep your spine neutral. Sitting   When sitting, keep your spine neutral and keep your feet flat on the floor. Use a footrest, if necessary, and keep your thighs parallel to the floor. Avoid rounding your shoulders, and avoid tilting your head forward.  When working at a desk or a computer, keep your desk at a height where your hands are slightly lower than your elbows. Slide your chair under your desk so you are close enough to maintain good posture.  When working at a computer, place your monitor at a  height where you are looking straight ahead and you do not have to tilt your head forward or downward to look at the screen. Resting  When lying down and resting, avoid positions that are most painful for you.  If you have pain with activities such as sitting, bending, stooping, or squatting, lie in a position in which your body does not bend very much. For example, avoid curling up on your side with your arms and knees near your chest (fetal position).  If you have pain with activities such as standing for a long time or reaching with your arms, lie with your spine in a neutral position and bend your knees slightly. Try the following positions: ? Lying on your side with a pillow between your knees. ? Lying on your back with a pillow under your knees. Lifting   When lifting objects, keep your feet at least shoulder width apart and tighten your abdominal muscles.  Bend your knees and  hips and keep your spine neutral. It is important to lift using the strength of your legs, not your back. Do not lock your knees straight out.  Always ask for help to lift heavy or awkward objects. This information is not intended to replace advice given to you by your health care provider. Make sure you discuss any questions you have with your health care provider. Document Revised: 08/19/2018 Document Reviewed: 05/19/2018 Elsevier Patient Education  [place].

## 2019-10-10 ENCOUNTER — Ambulatory Visit: Payer: 59 | Admitting: Physical Therapy

## 2019-10-25 ENCOUNTER — Other Ambulatory Visit: Payer: Self-pay

## 2019-10-25 ENCOUNTER — Encounter: Payer: Self-pay | Admitting: Physical Therapy

## 2019-10-25 ENCOUNTER — Ambulatory Visit: Payer: 59 | Attending: Nurse Practitioner | Admitting: Physical Therapy

## 2019-10-25 DIAGNOSIS — M5441 Lumbago with sciatica, right side: Secondary | ICD-10-CM

## 2019-10-25 NOTE — Therapy (Signed)
[facility] [place], [place], [address] Phone: [phone]   Fax:  [phone]  Physical Therapy Evaluation  Patient Details  Name: Abigail [name] MRN: [id number] Date of Birth: 10-09-1970 Referring Provider (PT): Nche   Encounter Date: 10/25/2019   PT End of Session - 10/25/19 1712    Visit Number 1    Number of Visits 3    Authorization Type [employer], info pulled off of website reported only 3 visits, may need to call and verify    PT Start Time 1530    PT Stop Time 1615    PT Time Calculation (min) 45 min    Activity Tolerance Patient tolerated treatment well    Behavior During Therapy [facility] for tasks assessed/performed           Past Medical History:  Diagnosis Date  . Anemia    iron supp  . Anxiety   . Depression   . Fibroids   . GERD (gastroesophageal reflux disease)    omeprazole  . Headache(784.0)   . Hypertension 2012   Maxide  . S/P LEEP 1992  . Urticaria     Past Surgical History:  Procedure Laterality Date  . Inicision and drainage of thrombosed hemorrhoid  04/06/2018  . LEEP  1998   Abnormal pap  . SUPRACERVICAL ABDOMINAL HYSTERECTOMY  01/18/2012   Procedure: HYSTERECTOMY SUPRACERVICAL ABDOMINAL with LSO;  Surgeon: Osborne Oman, MD;  Location: [facility];  Service: Gynecology;  Laterality: N/A;  . TUBAL LIGATION  1997  . WISDOM TOOTH EXTRACTION      There were no vitals filed for this visit.    Subjective Assessment - 10/25/19 1537    Subjective Patient reports right low back pain and right posterior leg pain for a few months,  Unsure of a specific cause.  Reports that she had x-rays that showed some scoliosis and mild degenerative changes    Limitations Lifting;Standing    Patient Stated Goals have less pain    Currently in Pain? Yes    Pain Score 3     Pain Location Leg    Pain Orientation Right;Posterior;Upper    Pain Descriptors / Indicators Aching;Dull    Pain Type  Acute pain    Pain Radiating Towards reports that she first felt pain in the right low back now mostly in the right posterior upper leg stopes at the right knee    Pain Onset More than a month ago    Pain Frequency Intermittent    Aggravating Factors  sitting, pain up to 6/10, also hurts with walking    Pain Relieving Factors pain meds, reports that sometimes the pain will subside, put something behind the thigh,    Effect of Pain on Daily Activities reports difficulty sitting and with activity              [facility] PT Assessment - 10/25/19 0001      Assessment   Medical Diagnosis right low back pain and right leg pain    Referring Provider (PT) Nche    Onset Date/Surgical Date 08/25/19    Prior Therapy no      Precautions   Precautions None      Balance Screen   Has the patient fallen in the past 6 months No    Has the patient had a decrease in activity level because of a fear of falling?  No    Is the patient reluctant to leave their  home because of a fear of falling?  No      Home Environment   Additional Comments has stairs, does housework      Prior Function   Level of Independence Independent    Vocation Full time employment    Vocation Requirements standing and stair climbing    Leisure no exercise      Posture/Postural Control   Posture Comments fwd head, rounded shoulders      ROM / Strength   AROM / PROM / Strength AROM;Strength      AROM   Overall AROM Comments Lumbar ROM WFL's with mild c/o tightness, extension was decreased 50%       Strength   Overall Strength Comments hips 4+/5 mild pain in the left posterior thigh      Flexibility   Soft Tissue Assessment /Muscle Length yes    Hamstrings very tight and painful on the right    ITB tight    Piriformis very tight      Palpation   Palpation comment mild tightness in the right buttock, no tenderness in the low back or the buttock, she is very tender and painful in the right mm belly of the HS       Ambulation/Gait   Gait Comments has a slight limp on the right                      Objective measurements completed on examination: See above findings.       [facility] Adult PT Treatment/Exercise - 10/25/19 0001      Modalities   Modalities Electrical Stimulation;Moist Heat      Moist Heat Therapy   Number Minutes Moist Heat 10 Minutes    Moist Heat Location Hip      Electrical Stimulation   Electrical Stimulation Location right HS mm belly    Electrical Stimulation Action IFC    Electrical Stimulation Parameters elevated    Electrical Stimulation Goals Pain                       PT Long Term Goals - 10/25/19 1717      PT LONG TERM GOAL #1   Title independent with HEP    Time 8    Period Weeks    Status New      PT LONG TERM GOAL #2   Title understand posture and body mechanics    Time 8    Period Weeks    Status New      PT LONG TERM GOAL #3   Title decrease leg pain 50%    Time 8    Period Weeks    Status New      PT LONG TERM GOAL #4   Title report able to sit > 30 minutes without HS pain    Time 8    Period Weeks    Status New                  Plan - 10/25/19 1713    Clinical Impression Statement Patient reports that about 3 months ago she started having LBP, she reports that it really become for right posterior leg pain, she is unsure of a cause, she does mention getting a tattoo and it huritng ans eh may have been very tense.  Her lumbar ROM for flexion was WNL's, lumbar extension was limited 50%, hip motions were WFL's with mild discomfort in the HS area.  She  has some tightness in the lumbar area, the only place she is tender is in the mm belly of the HS, and it is prominent and very tender, she is very tight in the HS and the piriformis    Stability/Clinical Decision Making Stable/Uncomplicated    PT Frequency 1x / week    PT Duration 8 weeks    PT Treatment/Interventions ADLs/Self Care Home  Management;Cryotherapy;Electrical Stimulation;Iontophoresis 4mg /ml Dexamethasone;Moist Heat;Ultrasound;Traction;Functional mobility training;Therapeutic activities;Therapeutic exercise;Contrast Bath;Manual techniques;Patient/family education    PT Next Visit Plan check to see if only 3 visits are correct, work on the HS and see if it is the issue vs sciatica    Consulted and Agree with Plan of Care Patient           Patient will benefit from skilled therapeutic intervention in order to improve the following deficits and impairments:  Abnormal gait, Decreased range of motion, Difficulty walking, Increased muscle spasms, Decreased activity tolerance, Pain, Impaired flexibility  Visit Diagnosis: Acute right-sided low back pain with right-sided sciatica - Plan: PT plan of care cert/re-cert     Problem List Patient Active Problem List   Diagnosis Date Noted  . Chronic idiopathic constipation 08/29/2019  . Intermittent palpitations 11/29/2018  . Migraine without aura and without status migrainosus, not intractable 10/19/2018  . Chronic right-sided low back pain without sciatica 12/13/2017  . Vitamin D insufficiency 07/27/2017  . Seasonal allergic rhinitis 07/27/2017  . Gallop rhythm 06/25/2017  . Generalized anxiety disorder 04/10/2016  . Hyperglycemia 04/10/2016  . GERD (gastroesophageal reflux disease) 04/10/2016  . Environmental allergies 06/17/2012  . S/p Abdominal Supracervical Hysterectomy, Left Salpingoohorectomy, Right Salpingectomy on 01/18/12 01/18/2012  . HTN (hypertension) 10/29/2011    Sumner Boast., PT 10/25/2019, 5:20 PM  [facility] [place], [place], [address] Phone: [phone]   Fax:  [phone]  Name: Abigail [name] MRN: [id number] Date of Birth: 25-Jun-1970

## 2019-10-25 NOTE — Patient Instructions (Signed)
Access Code: [email or web address] URL: [email or web address] Date: 10/25/2019 Prepared by: Lum Babe  Exercises Supine Hamstring Stretch with Strap - 2 x daily - 7 x weekly - 1 sets - 5 reps - 30 hold Seated Hamstring Stretch with Chair - 2 x daily - 7 x weekly - 1 sets - 5 reps - 30 hold Supine Piriformis Stretch Pulling Heel to Hip - 2 x daily - 7 x weekly - 1 sets - 5 reps - 30 hold

## 2019-11-07 ENCOUNTER — Encounter: Payer: Self-pay | Admitting: Physical Therapy

## 2019-11-07 ENCOUNTER — Ambulatory Visit: Payer: 59 | Admitting: Physical Therapy

## 2019-11-07 ENCOUNTER — Other Ambulatory Visit: Payer: Self-pay

## 2019-11-07 DIAGNOSIS — M5441 Lumbago with sciatica, right side: Secondary | ICD-10-CM

## 2019-11-07 NOTE — Therapy (Signed)
[place], [place], [address] Phone: [phone]   Fax:  [phone]  Physical Therapy Treatment  Patient Details  Name: Genise Strack MRN: [id number] Date of Birth: 12-06-1970 Referring Provider (PT): Nche   Encounter Date: 11/07/2019   PT End of Session - 11/07/19 1704    Visit Number 2    Number of Visits 3    Authorization Type [employer], info pulled off of website reported only 3 visits, may need to call and verify    PT Start Time 1620    PT Stop Time 1715    PT Time Calculation (min) 55 min    Activity Tolerance Patient tolerated treatment well    Behavior During Therapy [facility] for tasks assessed/performed           Past Medical History:  Diagnosis Date  . Anemia    iron supp  . Anxiety   . Depression   . Fibroids   . GERD (gastroesophageal reflux disease)    omeprazole  . Headache(784.0)   . Hypertension 2012   Maxide  . S/P LEEP 1992  . Urticaria     Past Surgical History:  Procedure Laterality Date  . Inicision and drainage of thrombosed hemorrhoid  04/06/2018  . LEEP  1998   Abnormal pap  . SUPRACERVICAL ABDOMINAL HYSTERECTOMY  01/18/2012   Procedure: HYSTERECTOMY SUPRACERVICAL ABDOMINAL with LSO;  Surgeon: Osborne Oman, MD;  Location: [facility];  Service: Gynecology;  Laterality: N/A;  . TUBAL LIGATION  1997  . WISDOM TOOTH EXTRACTION      There were no vitals filed for this visit.   Subjective Assessment - 11/07/19 1622    Subjective Patient reports that she felt like the estim really helped, she is still sore and tender in the right mm belly of the HS, some pain in the low back    Currently in Pain? Yes    Pain Score 4     Pain Location Leg    Pain Orientation Right;Upper;Posterior    Pain Descriptors / Indicators Sore    Aggravating Factors  sitting                             [facility] Adult PT Treatment/Exercise - 11/07/19 0001      Exercises    Exercises Lumbar      Lumbar Exercises: Stretches   Passive Hamstring Stretch Right;3 reps;20 seconds      Lumbar Exercises: Aerobic   Nustep level 3 x 66mnutes      Lumbar Exercises: Machines for Strengthening   Cybex Knee Extension 5# 2x10    Cybex Knee Flexion 20# 2x10    Leg Press 20# 2x10    Other Lumbar Machine Exercise lats 25# 2x10, rows 25# 2x10      Lumbar Exercises: Supine   Other Supine Lumbar Exercises feet on ball K2C, small bridges and isometric abbs      Modalities   Modalities Electrical Stimulation;Moist Heat      Moist Heat Therapy   Number Minutes Moist Heat 10 Minutes    Moist Heat Location Hip      Electrical Stimulation   Electrical Stimulation Location right HS mm belly    Electrical Stimulation Action IFC    Electrical Stimulation Parameters elevated    Electrical Stimulation Goals Pain      Manual Therapy   Manual Therapy Soft tissue mobilization  Soft tissue mobilization to the right mm belly                       PT Long Term Goals - 11/07/19 1708      PT LONG TERM GOAL #1   Title independent with HEP    Status Partially Met      PT LONG TERM GOAL #2   Title understand posture and body mechanics    Status Partially Met                 Plan - 11/07/19 1704    Clinical Impression Statement Patient had no increase of LBP and or HS pain during our exercises that we started.  She remains very tender in the right HS mm belly.  She has ordered a TENS and we went over safety and how to use.  I have concerns about the [facility] website said we could only see her 3 visits, I will try to check this tomorrow and see if we can see more as I would like to advance the core exercises    PT Next Visit Plan check her visits, she will be getting a TENS in the next few days    Consulted and Agree with Plan of Care Patient           Patient will benefit from skilled therapeutic intervention in order to improve the following deficits  and impairments:  Abnormal gait, Decreased range of motion, Difficulty walking, Increased muscle spasms, Decreased activity tolerance, Pain, Impaired flexibility  Visit Diagnosis: Acute right-sided low back pain with right-sided sciatica     Problem List Patient Active Problem List   Diagnosis Date Noted  . Chronic idiopathic constipation 08/29/2019  . Intermittent palpitations 11/29/2018  . Migraine without aura and without status migrainosus, not intractable 10/19/2018  . Chronic right-sided low back pain without sciatica 12/13/2017  . Vitamin D insufficiency 07/27/2017  . Seasonal allergic rhinitis 07/27/2017  . Gallop rhythm 06/25/2017  . Generalized anxiety disorder 04/10/2016  . Hyperglycemia 04/10/2016  . GERD (gastroesophageal reflux disease) 04/10/2016  . Environmental allergies 06/17/2012  . S/p Abdominal Supracervical Hysterectomy, Left Salpingoohorectomy, Right Salpingectomy on 01/18/12 01/18/2012  . HTN (hypertension) 10/29/2011    Sumner Boast., PT 11/07/2019, 5:09 PM  [place], [place], [address] Phone: [phone]   Fax:  [phone]  Name: Haevyn Ury MRN: [id number] Date of Birth: [birth year]

## 2019-11-10 ENCOUNTER — Other Ambulatory Visit: Payer: Self-pay

## 2019-11-10 ENCOUNTER — Encounter: Payer: Self-pay | Admitting: Nurse Practitioner

## 2019-11-10 ENCOUNTER — Telehealth (INDEPENDENT_AMBULATORY_CARE_PROVIDER_SITE_OTHER): Payer: 59 | Admitting: Nurse Practitioner

## 2019-11-10 VITALS — Ht 66.0 in | Wt 182.0 lb

## 2019-11-10 DIAGNOSIS — H1132 Conjunctival hemorrhage, left eye: Secondary | ICD-10-CM

## 2019-11-10 MED ORDER — SYSTANE 0.4-0.3 % OP SOLN: 1.0000 [drp] | OPHTHALMIC | 0 refills | Status: DC | PRN

## 2019-11-10 NOTE — Progress Notes (Signed)
Virtual Visit via Video Note  I connected with@ on 11/10/19 at 11:00 AM EDT by a video enabled telemedicine application and verified that I am speaking with the correct person using two identifiers.  Location: Patient:Home Provider: Office Participants: patient and provider  I discussed the limitations of evaluation and management by telemedicine and the availability of in person appointments. I also discussed with the patient that there may be a patient responsible charge related to this service. The patient expressed understanding and agreed to proceed.  CC:pt states her left eye is red around the pupil, feels irritated, doesn't itch and she reports no crust when she wakes up an no pus or abnormal dishcharge//pt states its been going on for 3 days-tried clear eyes didn't help  History of Present Illness: Conjunctivitis  The current episode started 2 days ago. The onset was sudden. The problem has been unchanged. Nothing relieves the symptoms. Nothing aggravates the symptoms. Pertinent negatives include no fever, no decreased vision, no double vision, no eye itching, no photophobia, no congestion, no ear pain, no headaches, no hearing loss, no rhinorrhea, no swollen glands, no URI, no rash, no eye discharge and no eye pain. The eye pain is not associated with movement. She has been behaving normally. There were no sick contacts.  use of contact lens, does not sleep with lens  Observations/Objective: Physical Exam Eyes:     General: Lids are normal. No scleral icterus.       Right eye: No discharge.        Left eye: No discharge.     Extraocular Movements: Extraocular movements intact.     Conjunctiva/sclera:     Right eye: Right conjunctiva is not injected. No chemosis, exudate or hemorrhage.    Left eye: Left conjunctiva is not injected. Hemorrhage present. No chemosis or exudate. Musculoskeletal:     Cervical back: Normal range of motion and neck supple.  Skin:    Findings: No  erythema or rash.  Neurological:     Mental Status: She is alert and oriented to person, place, and time.    Assessment and Plan: Ramie was seen today for conjunctivitis.  Diagnoses and all orders for this visit:  Subconjunctival hemorrhage, non-traumatic, left -     Polyethyl Glycol-Propyl Glycol (SYSTANE) 0.4-0.3 % SOLN; Apply 1 drop to eye as needed.   Follow Up Instructions: Lubricant with systane. Call office if any signs of bacterial conjunctivitis or change in vision. Avoid use of contact lens till symptoms resolves   I discussed the assessment and treatment plan with the patient. The patient was provided an opportunity to ask questions and all were answered. The patient agreed with the plan and demonstrated an understanding of the instructions.   The patient was advised to call back or seek an in-person evaluation if the symptoms worsen or if the condition fails to improve as anticipated.  Wilfred Lacy, NP

## 2019-11-10 NOTE — Patient Instructions (Addendum)
Lubricant with systane. Call office if any signs of bacterial conjunctivitis or change in vision. Avoid use of contact lens till symptoms resolves.  Subconjunctival Hemorrhage Subconjunctival hemorrhage is bleeding that happens between the white part of your eye (sclera) and the clear membrane that covers the outside of your eye (conjunctiva). There are many tiny blood vessels near the surface of your eye. A subconjunctival hemorrhage happens when one or more of these vessels breaks and bleeds, causing a red patch to appear on your eye. This is similar to a bruise. Depending on the amount of bleeding, the red patch may only cover a small area of your eye or it may cover the entire visible part of the sclera. If a lot of blood collects under the conjunctiva, there may also be swelling. Subconjunctival hemorrhages do not affect your vision or cause pain, but your eye may feel irritated if there is swelling. Subconjunctival hemorrhages usually do not require treatment, and they usually disappear on their own within two weeks. What are the causes? This condition may be caused by:  Mild trauma, such as rubbing your eye too hard.  Blunt injuries, such as from playing sports or having contact with a deployed airbag.  Coughing, sneezing, or vomiting.  Straining, such as when lifting a heavy object.  High blood pressure.  Recent eye surgery.  Diabetes.  Certain medicines, especially blood thinners (anticoagulants).  Other conditions, such as eye tumors, bleeding disorders, or blood vessel abnormalities. Subconjunctival hemorrhages can also happen without an obvious cause. What are the signs or symptoms? Symptoms of this condition include:  A bright red or dark red patch on the white part of the eye. The red area may: ? Spread out to cover a larger area of the eye before it goes away. ? Turn brownish-yellow before it goes away.  Swelling around the eye.  Mild eye irritation. How is this  diagnosed? This condition is diagnosed with a physical exam. If your subconjunctival hemorrhage was caused by trauma, your health care provider may refer you to an eye specialist (ophthalmologist) or another specialist to check for other injuries. You may have other tests, including:  An eye exam.  A blood pressure check.  Blood tests to check for bleeding disorders. If your subconjunctival hemorrhage was caused by trauma, X-rays or a CT scan may be done to check for other injuries. How is this treated? Usually, treatment is not needed for this condition. If you have discomfort, your health care provider may recommend eye drops or cold compresses. Follow these instructions at home:  Take over-the-counter and prescription medicines only as directed by your health care provider.  Use eye drops or cold compresses to help with discomfort as directed by your health care provider.  Avoid activities, things, and environments that may irritate or injure your eye.  Keep all follow-up visits as told by your health care provider. This is important. Contact a health care provider if:  You have pain in your eye.  The bleeding does not go away within 3 weeks.  You keep getting new subconjunctival hemorrhages. Get help right away if:  Your vision changes or you have difficulty seeing.  You suddenly develop severe sensitivity to light.  You develop a severe headache, persistent vomiting, confusion, or abnormal tiredness (lethargy).  Your eye seems to bulge or protrude from your eye socket.  You develop unexplained bruises on your body.  You have unexplained bleeding in another area of your body. Summary  Subconjunctival hemorrhage is  bleeding that happens between the white part of your eye and the clear membrane that covers the outside of your eye.  This condition is similar to a bruise.  Subconjunctival hemorrhages usually do not require treatment, and they usually disappear on their  own within two weeks.  Use eye drops or cold compresses to help with discomfort as directed by your health care provider. This information is not intended to replace advice given to you by your health care provider. Make sure you discuss any questions you have with your health care provider. Document Revised: 10/12/2018 Document Reviewed: 01/26/2018 Elsevier Patient Education  [place].

## 2019-11-16 ENCOUNTER — Ambulatory Visit: Payer: 59 | Attending: Nurse Practitioner | Admitting: Physical Therapy

## 2019-11-16 DIAGNOSIS — M5441 Lumbago with sciatica, right side: Secondary | ICD-10-CM | POA: Insufficient documentation

## 2019-11-20 ENCOUNTER — Encounter: Payer: Self-pay | Admitting: Physical Therapy

## 2019-11-20 ENCOUNTER — Ambulatory Visit: Payer: 59 | Admitting: Physical Therapy

## 2019-11-20 ENCOUNTER — Other Ambulatory Visit: Payer: Self-pay

## 2019-11-20 DIAGNOSIS — M5441 Lumbago with sciatica, right side: Secondary | ICD-10-CM | POA: Diagnosis not present

## 2019-11-20 NOTE — Therapy (Signed)
[place], [place], [address] Phone: [phone]   Fax:  [phone]  Physical Therapy Treatment  Patient Details  Name: Abigail [name] MRN: [id number] Date of Birth: 09/07/1970 Referring Provider (PT): Nche   Encounter Date: 11/20/2019   PT End of Session - 11/20/19 0927    Visit Number 3    Number of Visits 30    PT Start Time 0845    PT Stop Time 0945    PT Time Calculation (min) 60 min    Activity Tolerance Patient tolerated treatment well    Behavior During Therapy [facility] for tasks assessed/performed           Past Medical History:  Diagnosis Date   Anemia    iron supp   Anxiety    Depression    Fibroids    GERD (gastroesophageal reflux disease)    omeprazole   Headache(784.0)    Hypertension 2012   Maxide   S/P LEEP 1992   Urticaria     Past Surgical History:  Procedure Laterality Date   Inicision and drainage of thrombosed hemorrhoid  04/06/2018   LEEP  1998   Abnormal pap   SUPRACERVICAL ABDOMINAL HYSTERECTOMY  01/18/2012   Procedure: HYSTERECTOMY SUPRACERVICAL ABDOMINAL with LSO;  Surgeon: Osborne Oman, MD;  Location: [facility];  Service: Gynecology;  Laterality: N/A;   TUBAL LIGATION  1997   WISDOM TOOTH EXTRACTION      There were no vitals filed for this visit.   Subjective Assessment - 11/20/19 0855    Subjective Patient continues to report soreness in the HS, she reports feels good for a while but just hurts, especially with stairs    Currently in Pain? Yes    Pain Score 5     Pain Location Leg    Pain Orientation Right;Posterior;Upper    Aggravating Factors  stairs, standing                             [facility] Adult PT Treatment/Exercise - 11/20/19 0001      Lumbar Exercises: Stretches   Passive Hamstring Stretch Right;3 reps;20 seconds    Piriformis Stretch Right;3 reps;20 seconds      Lumbar Exercises: Aerobic   Recumbent  Bike level 1 x 5 minutes      Lumbar Exercises: Machines for Strengthening   Cybex Knee Extension 5# 2x15    Cybex Knee Flexion 20# 2x15    Leg Press 20# 2x10      Lumbar Exercises: Standing   Other Standing Lumbar Exercises resisted gait all directions      Lumbar Exercises: Supine   Other Supine Lumbar Exercises feet on ball K2C, small bridges and isometric abbs      Modalities   Modalities Iontophoresis      Moist Heat Therapy   Number Minutes Moist Heat 10 Minutes    Moist Heat Location Hip      Electrical Stimulation   Electrical Stimulation Location right HS mm belly    Electrical Stimulation Action IFC    Electrical Stimulation Parameters elevated    Electrical Stimulation Goals Pain      Iontophoresis   Type of Iontophoresis Dexamethasone    Location right HS mm belly     Dose 74m    Time 4 hour patch  PT Long Term Goals - 11/20/19 0942      PT LONG TERM GOAL #1   Title independent with HEP    Status Partially Met      PT LONG TERM GOAL #2   Title understand posture and body mechanics    Status Partially Met      PT LONG TERM GOAL #3   Title decrease leg pain 50%    Status Partially Met                 Plan - 11/20/19 0928    Clinical Impression Statement Patietn continues to have the right HS pain and soreness, I did more pirirformis today and asked her to do at home.  Sh was able to do all exercises without an increaes of the pain    PT Next Visit Plan Patient has trainign at work and will not be in for two weeks    Consulted and Agree with Plan of Care Patient           Patient will benefit from skilled therapeutic intervention in order to improve the following deficits and impairments:  Abnormal gait, Decreased range of motion, Difficulty walking, Increased muscle spasms, Decreased activity tolerance, Pain, Impaired flexibility  Visit Diagnosis: Acute right-sided low back pain with right-sided  sciatica     Problem List Patient Active Problem List   Diagnosis Date Noted   Chronic idiopathic constipation 08/29/2019   Intermittent palpitations 11/29/2018   Migraine without aura and without status migrainosus, not intractable 10/19/2018   Chronic right-sided low back pain without sciatica 12/13/2017   Vitamin D insufficiency 07/27/2017   Seasonal allergic rhinitis 07/27/2017   Gallop rhythm 06/25/2017   Generalized anxiety disorder 04/10/2016   Hyperglycemia 04/10/2016   GERD (gastroesophageal reflux disease) 04/10/2016   Environmental allergies 06/17/2012   S/p Abdominal Supracervical Hysterectomy, Left Salpingoohorectomy, Right Salpingectomy on 01/18/12 01/18/2012   HTN (hypertension) 10/29/2011    Sumner Boast., PT 11/20/2019, 9:45 AM  [place], [place], [address] Phone: [phone]   Fax:  [phone]  Name: Abigail [name] MRN: [id number] Date of Birth: Jun 02, 1970

## 2019-11-27 ENCOUNTER — Encounter: Payer: 59 | Admitting: Physical Therapy

## 2019-12-13 ENCOUNTER — Encounter: Payer: Self-pay | Admitting: Physical Therapy

## 2019-12-13 ENCOUNTER — Ambulatory Visit: Payer: 59 | Attending: Nurse Practitioner | Admitting: Physical Therapy

## 2019-12-13 ENCOUNTER — Other Ambulatory Visit: Payer: Self-pay

## 2019-12-13 DIAGNOSIS — R252 Cramp and spasm: Secondary | ICD-10-CM | POA: Diagnosis present

## 2019-12-13 DIAGNOSIS — M5441 Lumbago with sciatica, right side: Secondary | ICD-10-CM | POA: Diagnosis not present

## 2019-12-13 NOTE — Therapy (Signed)
[place], [place], [address] Phone: [phone]   Fax:  [phone]  Physical Therapy Treatment  Patient Details  Name: Abigail [name] MRN: [id number] Date of Birth: 10/02/1970 Referring Provider (PT): Nche   Encounter Date: 12/13/2019   PT End of Session - 12/13/19 0927    Visit Number 4    Number of Visits 60    PT Start Time 0848    PT Stop Time 0933    PT Time Calculation (min) 45 min    Activity Tolerance Patient tolerated treatment well    Behavior During Therapy [facility] for tasks assessed/performed           Past Medical History:  Diagnosis Date  . Anemia    iron supp  . Anxiety   . Depression   . Fibroids   . GERD (gastroesophageal reflux disease)    omeprazole  . Headache(784.0)   . Hypertension 2012   Maxide  . S/P LEEP 1992  . Urticaria     Past Surgical History:  Procedure Laterality Date  . Inicision and drainage of thrombosed hemorrhoid  04/06/2018  . LEEP  1998   Abnormal pap  . SUPRACERVICAL ABDOMINAL HYSTERECTOMY  01/18/2012   Procedure: HYSTERECTOMY SUPRACERVICAL ABDOMINAL with LSO;  Surgeon: Osborne Oman, MD;  Location: [facility];  Service: Gynecology;  Laterality: N/A;  . TUBAL LIGATION  1997  . WISDOM TOOTH EXTRACTION      There were no vitals filed for this visit.   Subjective Assessment - 12/13/19 0851    Subjective Pt states that she was feeling better but during training at work she developed soreness in HS going up stairs and pain in LB. States it is a constant dull aching.    Currently in Pain? Yes    Pain Score 7     Pain Location Back                             [facility] Adult PT Treatment/Exercise - 12/13/19 0001      Lumbar Exercises: Stretches   Active Hamstring Stretch Right;Left;1 rep;30 seconds    Other Lumbar Stretch Exercise seated fwd/lat flexion with exercise ball      Lumbar Exercises: Aerobic   Recumbent Bike level  1 x 6 min      Lumbar Exercises: Supine   Bridge 10 reps;3 seconds    Bridge Limitations with PPT    Other Supine Lumbar Exercises feet on ball k2c, lower trunk rotations exercise ball, isometric abs      Lumbar Exercises: Sidelying   Other Sidelying Lumbar Exercises open book thoracic rotation x5      Moist Heat Therapy   Number Minutes Moist Heat 10 Minutes    Moist Heat Location Hip   hamstring     Manual Therapy   Manual Therapy Soft tissue mobilization    Soft tissue mobilization R hamstring mm. belly, mid thoracic lumbar paraspinals                       PT Long Term Goals - 11/20/19 0942      PT LONG TERM GOAL #1   Title independent with HEP    Status Partially Met      PT LONG TERM GOAL #2   Title understand posture and body mechanics    Status Partially Met      PT  LONG TERM GOAL #3   Title decrease leg pain 50%    Status Partially Met                 Plan - 12/13/19 0928    Clinical Impression Statement Pt reports HS pain and back pain was subsiding until training over the past couple of weeks; pt experiences most pain with stairs. Pt reported relief with hamstring stretching and STM; no increased pain reported with exercise today.    PT Treatment/Interventions ADLs/Self Care Home Management;Cryotherapy;Electrical Stimulation;Iontophoresis 33m/ml Dexamethasone;Moist Heat;Ultrasound;Traction;Functional mobility training;Therapeutic activities;Therapeutic exercise;Contrast Bath;Manual techniques;Patient/family education    PT Next Visit Plan Patient has trainign at work and will not be in for two weeks    Consulted and Agree with Plan of Care Patient           Patient will benefit from skilled therapeutic intervention in order to improve the following deficits and impairments:  Abnormal gait, Decreased range of motion, Difficulty walking, Increased muscle spasms, Decreased activity tolerance, Pain, Impaired flexibility  Visit  Diagnosis: Acute right-sided low back pain with right-sided sciatica  Cramp and spasm     Problem List Patient Active Problem List   Diagnosis Date Noted  . Chronic idiopathic constipation 08/29/2019  . Intermittent palpitations 11/29/2018  . Migraine without aura and without status migrainosus, not intractable 10/19/2018  . Chronic right-sided low back pain without sciatica 12/13/2017  . Vitamin D insufficiency 07/27/2017  . Seasonal allergic rhinitis 07/27/2017  . Gallop rhythm 06/25/2017  . Generalized anxiety disorder 04/10/2016  . Hyperglycemia 04/10/2016  . GERD (gastroesophageal reflux disease) 04/10/2016  . Environmental allergies 06/17/2012  . S/p Abdominal Supracervical Hysterectomy, Left Salpingoohorectomy, Right Salpingectomy on 01/18/12 01/18/2012  . HTN (hypertension) 10/29/2011   AAmador Cunas PT, DPT ADonald ProseSugg 12/13/2019, 9:31 AM  [place] [address]Phone: [phone]  Fax:  [phone] Name: Abigail SilveriaMRN: [id number] of Birth: 5Sep 18, 1972

## 2019-12-14 ENCOUNTER — Ambulatory Visit: Payer: 59 | Admitting: Nurse Practitioner

## 2019-12-14 ENCOUNTER — Encounter: Payer: Self-pay | Admitting: Nurse Practitioner

## 2019-12-14 VITALS — BP 130/86 | HR 98 | Temp 98.7°F | Ht 66.0 in | Wt 190.0 lb

## 2019-12-14 DIAGNOSIS — M62830 Muscle spasm of back: Secondary | ICD-10-CM

## 2019-12-14 MED ORDER — CYCLOBENZAPRINE HCL 5 MG PO TABS: 5.0000 mg | ORAL_TABLET | Freq: Every evening | ORAL | 0 refills | Status: DC | PRN

## 2019-12-14 MED ORDER — KETOROLAC TROMETHAMINE 30 MG/ML IJ SOLN
30.0000 mg | Freq: Once | INTRAMUSCULAR | Status: AC
  Administered 2019-12-14: 30 mg via INTRAMUSCULAR

## 2019-12-14 MED ORDER — KETOROLAC TROMETHAMINE 30 MG/ML IJ SOLN: 30.0000 mg | Freq: Once | INTRAMUSCULAR | Status: DC

## 2019-12-14 NOTE — Progress Notes (Signed)
Subjective:  Patient ID: Abigail [name], female    DOB: 1970-07-03  Age: 49 y.o. MRN: [id number]  CC: Pain (Starts in upper back and around to the rib cage and abdominal pain. Started about 2 weeks ago. Has gallbladder. Hurts more after sleeping. Has been taking ibuprofen, naproxyn, cyclobenzaprine with little relief. )  HPI Abigail [name] presents with generalized muscle pain and spasm x 2weeks, onset after training exercise at work (tasing for 5seconds-probs attached to her shirt, tackle, etc). She is a Medical illustrator. She denies falling or LOC or focal weakness or loss of sensation or nausea or pain with food intake or SOB or PND. Pain is worse with prolonged inactivity, worse in Am.  Reviewed past Medical, Social and Family history today.  Outpatient Medications Prior to Visit  Medication Sig Dispense Refill  . amLODipine (NORVASC) 5 MG tablet Take 1 tablet (5 mg total) by mouth daily. 90 tablet 3  . fluticasone (FLONASE) 50 MCG/ACT nasal spray Place 1 spray into both nostrils daily. 16 g 5  . Ibuprofen-Famotidine 800-26.6 MG TABS Take 1 tablet by mouth 2 (two) times daily with a meal. 90 tablet 3  . loratadine (CLARITIN) 10 MG tablet Take 10 mg by mouth daily.    Marland Kitchen losartan (COZAAR) 25 MG tablet Take 1 tablet (25 mg total) by mouth daily. 90 tablet 3  . omeprazole (PRILOSEC) 20 MG capsule TAKE 1 CAPSULE(20 MG) BY MOUTH DAILY 90 capsule 1  . Polyethyl Glycol-Propyl Glycol (SYSTANE) 0.4-0.3 % SOLN Apply 1 drop to eye as needed.  0  . SUMAtriptan (IMITREX) 25 MG tablet Take 1 tablet (25 mg total) by mouth every 2 (two) hours as needed for migraine. Do not use more than 100mg  in 24hrs 10 tablet 0  . triamcinolone ointment (KENALOG) 0.5 % Apply 1 application topically 2 (two) times daily. You may use for 2 weeks at a time as needed. 30 g 0  . Vitamin D, Ergocalciferol, 2000 units CAPS Take 1 capsule by mouth daily after breakfast. 90 capsule 1  . cyclobenzaprine (FLEXERIL) 5 MG tablet Take 1-2  tablets (5-10 mg total) by mouth at bedtime. 20 tablet 0  . Ibuprofen-Famotidine 800-26.6 MG TABS Take 1 tablet by mouth 3 (three) times daily. 90 tablet 3  . cetirizine (ZYRTEC) 10 MG tablet Take 10 mg by mouth daily.    Marland Kitchen lubiprostone (AMITIZA) 8 MCG capsule Take 1 capsule (8 mcg total) by mouth 2 (two) times daily with a meal. 60 capsule 5   No facility-administered medications prior to visit.    ROS See HPI  Objective:  BP 130/86 (BP Location: Left Arm, Patient Position: Sitting, Cuff Size: Large)   Pulse 98   Temp 98.7 F (37.1 C)   Ht 5\' 6"  (1.676 m)   Wt 190 lb (86.2 kg)   LMP 01/18/2012 (Exact Date)   SpO2 99%   BMI 30.67 kg/m   Physical Exam Constitutional:      Appearance: She is obese.  Cardiovascular:     Rate and Rhythm: Normal rate and regular rhythm.     Pulses: Normal pulses.     Heart sounds: Normal heart sounds.  Pulmonary:     Breath sounds: Normal breath sounds.  Abdominal:     General: Bowel sounds are normal. There is no distension.     Palpations: Abdomen is soft.     Tenderness: There is no abdominal tenderness. There is no guarding.  Musculoskeletal:        General:  Tenderness present. Normal range of motion.     Right lower leg: No edema.     Left lower leg: No edema.  Skin:    General: Skin is warm and dry.     Findings: No erythema or rash.  Neurological:     Mental Status: She is alert and oriented to person, place, and time.    Assessment & Plan:  This visit occurred during the SARS-CoV-2 public health emergency.  Safety protocols were in place, including screening questions prior to the visit, additional usage of staff PPE, and extensive cleaning of exam room while observing appropriate contact time as indicated for disinfecting solutions.   Abigail [name] was seen today for pain.  Diagnoses and all orders for this visit:  Muscle spasm of back -     cyclobenzaprine (FLEXERIL) 5 MG tablet; Take 1-2 tablets (5-10 mg total) by mouth at  bedtime as needed for muscle spasms. -     ketorolac (TORADOL) 30 MG/ML injection 30 mg -     Discontinue: ketorolac (TORADOL) 30 MG/ML injection 30 mg    Problem List Items Addressed This Visit    None    Visit Diagnoses    Muscle spasm of back    -  Primary   Relevant Medications   cyclobenzaprine (FLEXERIL) 5 MG tablet   Ibuprofen-Famotidine 800-26.6 MG TABS   ketorolac (TORADOL) 30 MG/ML injection 30 mg (Completed)      Follow-up: Return if symptoms worsen or fail to improve.  Wilfred Lacy, NP

## 2019-12-14 NOTE — Patient Instructions (Addendum)
Call office if no improvement in 3-5days Take ibuprofen and flexeril daily x 5days.  Muscle Cramps and Spasms Muscle cramps and spasms are when muscles tighten by themselves. They usually get better within minutes. Muscle cramps are painful. They are usually stronger and last longer than muscle spasms. Muscle spasms may or may not be painful. They can last a few seconds or much longer. Cramps and spasms can affect any muscle, but they occur most often in the calf muscles of the leg. They are usually not caused by a serious problem. In many cases, the cause is not known. Some common causes include:  Doing more physical work or exercise than your body is ready for.  Using the muscles too much (overuse) by repeating certain movements too many times.  Staying in a certain position for a long time.  Playing a sport or doing an activity without preparing properly.  Using bad form or technique while playing a sport or doing an activity.  Not having enough water in your body (dehydration).  Injury.  Side effects of some medicines.  Low levels of the salts and minerals in your blood (electrolytes), such as low potassium or calcium. Follow these instructions at home: Managing pain and stiffness      Massage, stretch, and relax the muscle. Do this for many minutes at a time.  If told, put heat on tight or tense muscles as often as told by your doctor. Use the heat source that your doctor recommends, such as a moist heat pack or a heating pad. ? Place a towel between your skin and the heat source. ? Leave the heat on for 20-30 minutes. ? Remove the heat if your skin turns bright red. This is very important if you are not able to feel pain, heat, or cold. You may have a greater risk of getting burned.  If told, put ice on the affected area. This may help if you are sore or have pain after a cramp or spasm. ? Put ice in a plastic bag. ? Place a towel between your skin and the bag. ? Leave  the ice on for 20 minutes, 2-3 times a day.  Try taking hot showers or baths to help relax tight muscles. Eating and drinking  Drink enough fluid to keep your pee (urine) pale yellow.  Eat a healthy diet to help ensure that your muscles work well. This should include: ? Fruits and vegetables. ? Lean protein. ? Whole grains. ? Low-fat or nonfat dairy products. General instructions  If you are having cramps often, avoid intense exercise for several days.  Take over-the-counter and prescription medicines only as told by your doctor.  Watch for any changes in your symptoms.  Keep all follow-up visits as told by your doctor. This is important. Contact a doctor if:  Your cramps or spasms get worse or happen more often.  Your cramps or spasms do not get better with time. Summary  Muscle cramps and spasms are when muscles tighten by themselves. They usually get better within minutes.  Cramps and spasms occur most often in the calf muscles of the leg.  Massage, stretch, and relax the muscle. This may help the cramp or spasm go away.  Drink enough fluid to keep your pee (urine) pale yellow. This information is not intended to replace advice given to you by your health care provider. Make sure you discuss any questions you have with your health care provider. Document Revised: 09/20/2017 Document Reviewed: 09/20/2017 Elsevier  Patient Education  [employer].

## 2019-12-18 ENCOUNTER — Encounter: Payer: Self-pay | Admitting: Physical Therapy

## 2019-12-18 ENCOUNTER — Other Ambulatory Visit: Payer: Self-pay

## 2019-12-18 ENCOUNTER — Ambulatory Visit: Payer: 59 | Admitting: Physical Therapy

## 2019-12-18 DIAGNOSIS — M5441 Lumbago with sciatica, right side: Secondary | ICD-10-CM

## 2019-12-18 DIAGNOSIS — R252 Cramp and spasm: Secondary | ICD-10-CM

## 2019-12-18 NOTE — Therapy (Signed)
[address] [place], [place], [address] Phone: [phone]   Fax:  [phone]  Physical Therapy Treatment  Patient Details  Name: Abigail [name] MRN: [id number] Date of Birth: [birth year] Referring Provider (PT): Nche   Encounter Date: 12/18/2019   PT End of Session - 12/18/19 0919    Visit Number 5    Number of Visits 60    PT Start Time 6063    PT Stop Time 0931    PT Time Calculation (min) 44 min    Activity Tolerance Patient tolerated treatment well    Behavior During Therapy [facility] for tasks assessed/performed           Past Medical History:  Diagnosis Date  . Anemia    iron supp  . Anxiety   . Depression   . Fibroids   . GERD (gastroesophageal reflux disease)    omeprazole  . Headache(784.0)   . Hypertension 2012   Maxide  . S/P LEEP 1992  . Urticaria     Past Surgical History:  Procedure Laterality Date  . Inicision and drainage of thrombosed hemorrhoid  04/06/2018  . LEEP  1998   Abnormal pap  . SUPRACERVICAL ABDOMINAL HYSTERECTOMY  01/18/2012   Procedure: HYSTERECTOMY SUPRACERVICAL ABDOMINAL with LSO;  Surgeon: Osborne Oman, MD;  Location: [facility];  Service: Gynecology;  Laterality: N/A;  . TUBAL LIGATION  1997  . WISDOM TOOTH EXTRACTION      There were no vitals filed for this visit.   Subjective Assessment - 12/18/19 0859    Subjective Pt states that HS was very sore after last rx; doesn't know if it was exercises at PT or climbing stairs at work.    Currently in Pain? Yes    Pain Score 7     Pain Location Leg    Pain Orientation Right;Posterior                             [facility] Adult PT Treatment/Exercise - 12/18/19 0001      Lumbar Exercises: Stretches   Active Hamstring Stretch Right;Left;1 rep;30 seconds    Lower Trunk Rotation 5 reps;10 seconds    Lower Trunk Rotation Limitations on exercise ball    Piriformis Stretch Right;Left;1 rep;30  seconds    Gastroc Stretch Right;Left;1 rep;30 seconds    Gastroc Stretch Limitations soleus stretch B x 30 sec    Other Lumbar Stretch Exercise dktc with exercise ball and isometric abs x10      Lumbar Exercises: Supine   Bridge 10 reps;3 seconds    Bridge Limitations with PPT      Moist Heat Therapy   Number Minutes Moist Heat 12 Minutes    Moist Heat Location Hip   hamstring     Electrical Stimulation   Electrical Stimulation Location right HS mm belly    Electrical Stimulation Action IFC    Electrical Stimulation Parameters elevated    Electrical Stimulation Goals Pain                       PT Long Term Goals - 11/20/19 0160      PT LONG TERM GOAL #1   Title independent with HEP    Status Partially Met      PT LONG TERM GOAL #2   Title understand posture and body mechanics    Status Partially Met  PT LONG TERM GOAL #3   Title decrease leg pain 50%    Status Partially Met                 Plan - 12/18/19 0920    Clinical Impression Statement Did not progress to machine interventions today d/t pt reports of increased pain following last rx and completing stairs at work. Focused on LE/lumbar flexibility ex's and stretching this rx. Pt reports relief with heat and estim.    PT Treatment/Interventions ADLs/Self Care Home Management;Cryotherapy;Electrical Stimulation;Iontophoresis 32m/ml Dexamethasone;Moist Heat;Ultrasound;Traction;Functional mobility training;Therapeutic activities;Therapeutic exercise;Contrast Bath;Manual techniques;Patient/family education    PT Next Visit Plan Progress LE flexibility, core stab, lumbar ex's    Consulted and Agree with Plan of Care Patient           Patient will benefit from skilled therapeutic intervention in order to improve the following deficits and impairments:  Abnormal gait, Decreased range of motion, Difficulty walking, Increased muscle spasms, Decreased activity tolerance, Pain, Impaired  flexibility  Visit Diagnosis: Acute right-sided low back pain with right-sided sciatica  Cramp and spasm     Problem List Patient Active Problem List   Diagnosis Date Noted  . Chronic idiopathic constipation 08/29/2019  . Intermittent palpitations 11/29/2018  . Migraine without aura and without status migrainosus, not intractable 10/19/2018  . Chronic right-sided low back pain without sciatica 12/13/2017  . Vitamin D insufficiency 07/27/2017  . Seasonal allergic rhinitis 07/27/2017  . Gallop rhythm 06/25/2017  . Generalized anxiety disorder 04/10/2016  . Hyperglycemia 04/10/2016  . GERD (gastroesophageal reflux disease) 04/10/2016  . Environmental allergies 06/17/2012  . S/p Abdominal Supracervical Hysterectomy, Left Salpingoohorectomy, Right Salpingectomy on 01/18/12 01/18/2012  . HTN (hypertension) 10/29/2011   AAmador Cunas PT, DPT ADonald ProseSugg 12/18/2019, 9:21 AM  [address] [place] [address]Phone: [phone]  Fax:  [phone] Name: Abigail RosadoMRN: [id number] of Birth: 5Jan 26, 1972

## 2019-12-27 ENCOUNTER — Encounter: Payer: 59 | Admitting: Physical Therapy

## 2019-12-28 ENCOUNTER — Other Ambulatory Visit: Payer: Self-pay | Admitting: Nurse Practitioner

## 2019-12-28 DIAGNOSIS — M62830 Muscle spasm of back: Secondary | ICD-10-CM

## 2019-12-29 ENCOUNTER — Encounter: Payer: Self-pay | Admitting: Nurse Practitioner

## 2020-01-12 ENCOUNTER — Other Ambulatory Visit: Payer: Self-pay | Admitting: Nurse Practitioner

## 2020-01-12 DIAGNOSIS — G43009 Migraine without aura, not intractable, without status migrainosus: Secondary | ICD-10-CM

## 2020-01-19 MED ORDER — SUMATRIPTAN SUCCINATE 25 MG PO TABS: 25.0000 mg | ORAL_TABLET | ORAL | 0 refills | Status: DC | PRN

## 2020-01-19 NOTE — Telephone Encounter (Signed)
Received a refill request for:  Sumatriptan 25 mg LR 5/121/21, 0 Rf, qty # 10 LOV 09/20/19 No f/u appts scheduled.   Please advise.  Thanks Dm/cma

## 2020-01-25 ENCOUNTER — Ambulatory Visit
Admission: RE | Admit: 2020-01-25 | Discharge: 2020-01-25 | Disposition: A | Discharge status: Home / Self Care | Payer: 59 | Source: Ambulatory Visit | Attending: Nurse Practitioner | Admitting: Nurse Practitioner

## 2020-01-25 ENCOUNTER — Other Ambulatory Visit: Payer: Self-pay

## 2020-01-25 DIAGNOSIS — R2232 Localized swelling, mass and lump, left upper limb: Secondary | ICD-10-CM

## 2020-03-04 ENCOUNTER — Other Ambulatory Visit: Payer: Self-pay | Admitting: Nurse Practitioner

## 2020-03-04 DIAGNOSIS — G43009 Migraine without aura, not intractable, without status migrainosus: Secondary | ICD-10-CM

## 2020-03-04 NOTE — Telephone Encounter (Signed)
Last OV 12/14/19 Last fill 01/25/20  #10/0

## 2020-03-08 ENCOUNTER — Other Ambulatory Visit: Payer: 59

## 2020-03-19 ENCOUNTER — Other Ambulatory Visit: Payer: Self-pay

## 2020-03-20 ENCOUNTER — Encounter: Payer: Self-pay | Admitting: Nurse Practitioner

## 2020-03-20 ENCOUNTER — Ambulatory Visit: Payer: 59 | Admitting: Nurse Practitioner

## 2020-03-20 VITALS — BP 140/88 | HR 82 | Temp 97.5°F | Ht 66.0 in | Wt 185.8 lb

## 2020-03-20 DIAGNOSIS — M7061 Trochanteric bursitis, right hip: Secondary | ICD-10-CM

## 2020-03-20 DIAGNOSIS — G43009 Migraine without aura, not intractable, without status migrainosus: Secondary | ICD-10-CM | POA: Diagnosis not present

## 2020-03-20 DIAGNOSIS — K219 Gastro-esophageal reflux disease without esophagitis: Secondary | ICD-10-CM

## 2020-03-20 DIAGNOSIS — Z23 Encounter for immunization: Secondary | ICD-10-CM | POA: Diagnosis not present

## 2020-03-20 MED ORDER — METHYLPREDNISOLONE ACETATE 80 MG/ML IJ SUSP: 20.0000 mg | Freq: Once | INTRAMUSCULAR | Status: DC

## 2020-03-20 MED ORDER — METHYLPREDNISOLONE ACETATE 40 MG/ML IJ SUSP
20.0000 mg | Freq: Once | INTRAMUSCULAR | Status: AC
  Administered 2020-03-20: 20 mg via INTRAMUSCULAR

## 2020-03-20 MED ORDER — SUMATRIPTAN SUCCINATE 25 MG PO TABS: 25.0000 mg | ORAL_TABLET | ORAL | 1 refills | Status: DC | PRN

## 2020-03-20 MED ORDER — OMEPRAZOLE 20 MG PO CPDR: 20.0000 mg | DELAYED_RELEASE_CAPSULE | Freq: Every day | ORAL | 1 refills | Status: DC

## 2020-03-20 NOTE — Patient Instructions (Signed)
Joint Steroid Injection A joint steroid injection is a procedure to relieve swelling and pain in a joint. Steroids are medicines that reduce inflammation. In this procedure, your health care provider uses a syringe and a needle to inject a steroid medicine into a painful and inflamed joint. A pain-relieving medicine (anesthetic) may be injected along with the steroid. In some cases, your health care provider may use an imaging technique such as ultrasound or fluoroscopy to guide the injection. Joints that are often treated with steroid injections include the knee, shoulder, hip, and spine. These injections may also be used in the elbow, ankle, and joints of the hands or feet. You may have joint steroid injections as part of your treatment for inflammation caused by:  Gout.  Rheumatoid arthritis.  Advanced wear-and-tear arthritis (osteoarthritis).  Tendinitis.  Bursitis. Joint steroid injections may be repeated, but having them too often can damage a joint or the skin over the joint. You should not have joint steroid injections less than 6 weeks apart or more than four times a year. Tell a health care provider about:  Any allergies you have.  All medicines you are taking, including vitamins, herbs, eye drops, creams, and over-the-counter medicines.  Any problems you or family members have had with anesthetic medicines.  Any blood disorders you have.  Any surgeries you have had.  Any medical conditions you have.  Whether you are pregnant or may be pregnant. What are the risks? Generally, this is a safe treatment. However, problems may occur, including:  Infection.  Bleeding.  Allergic reactions to medicines.  Damage to the joint or tissues around the joint.  Thinning of skin or loss of skin color over the joint.  Temporary flushing of the face or chest.  Temporary increase in pain.  Temporary increase in blood sugar.  Failure to relieve inflammation or pain. What  happens before the treatment?  You may have imaging tests of your joint.  Ask your health care provider about: ? Changing or stopping your regular medicines. This is especially important if you are taking diabetes medicines or blood thinners. ? Taking medicines such as aspirin and ibuprofen. These medicines can thin your blood. Do not take these medicines unless your health care provider tells you to take them. ? Taking over-the-counter medicines, vitamins, herbs, and supplements.  Ask your health care provider if you can drive yourself home after the procedure. What happens during the treatment?   Your health care provider will position you for the injection and locate the injection site over your joint.  The skin over the joint will be cleaned with a germ-killing soap.  Your health care provider may: ? Spray a numbing solution (topical anesthetic) over the injection site. ? Inject a local anesthetic under the skin above your joint.  The needle will be placed through your skin into your joint. Your health care provider may use imaging to guide the needle to the right spot for the injection. If imaging is used, a special contrast dye may be injected to confirm that the needle is in the correct location.  The steroid medicine will be injected into your joint.  Anesthetic may be injected along with the steroid. This may be a medicine that relieves pain for a short time (short-acting anesthetic) or for a longer time (long-acting anesthetic).  The needle will be removed, and an adhesive bandage (dressing) will be placed over the injection site. The procedure may vary among health care providers and hospitals. What can I   expect after the treatment?  You will be able to go home after the treatment.  It is normal to feel slight flushing for a few days after the injection.  After the treatment, it is common to have an increase in joint pain after the anesthetic has worn off. This may  happen about an hour after a short-acting anesthetic or about 8 hours after a longer-acting anesthetic.  You should begin to feel relief from joint pain and swelling after 24 to 48 hours. Follow these instructions at home: Injection site care  Leave the adhesive dressing over your injection site in place until your health care provider says you can remove it.  Check your injection site every day for signs of infection. Check for: ? Redness, swelling, or pain. ? Fluid or blood. ? Warmth. ? Pus or a bad smell. Activity  Return to your normal activities as told by your health care provider. Ask your health care provider what activities are safe for you. You may be asked to limit activities that put stress on the joint for a few days.  Do joint exercises as told by your health care provider.  Do not take baths, swim, or use a hot tub until your health care provider approves. Managing pain, stiffness, and swelling   If directed, put ice on the joint. ? Put ice in a plastic bag. ? Place a towel between your skin and the bag. ? Leave the ice on for 20 minutes, 2-3 times a day.  Raise (elevate) your joint above the level of your heart when you are sitting or lying down. General instructions  Take over-the-counter and prescription medicines only as told by your health care provider.  Do not use any products that contain nicotine or tobacco, such as cigarettes, e-cigarettes, and chewing tobacco. These can delay joint healing. If you need help quitting, ask your health care provider.  If you have diabetes, be aware that your blood sugar may be slightly elevated for several days after the injection.  Keep all follow-up visits as told by your health care provider. This is important. Contact a health care provider if you have:  Chills or a fever.  Any signs of infection at your injection site.  Increased pain or swelling or no relief after 2 days. Summary  A joint steroid injection  is a treatment to relieve pain and swelling in a joint.  Steroids are medicines that reduce inflammation. Your health care provider may add an anesthetic along with the steroid.  You may have joint steroid injections as part of your arthritis treatment.  Joint steroid injections may be repeated, but having them too often can damage a joint or the skin over the joint.  Contact your health care provider if you have a fever, chills, or signs of infection or if you get no relief from joint pain or swelling. This information is not intended to replace advice given to you by your health care provider. Make sure you discuss any questions you have with your health care provider. Document Revised: 12/28/2017 Document Reviewed: 12/28/2017 Elsevier Patient Education  2020 [employer]  

## 2020-03-20 NOTE — Progress Notes (Signed)
Subjective:  Patient ID: Abigail [name], female    DOB: [birth year]  Age: 49 y.o. MRN: [id number]  CC: Acute Visit (Rt leg pain, pt states she has tried physical therapy with no relief. Pt also needs refills on sumatriptin and omeprazole. )  Hip Pain  The incident occurred more than 1 week ago. There was no injury mechanism. The pain is present in the right hip and right thigh. The quality of the pain is described as aching. The pain has been worsening since onset. Pertinent negatives include no inability to bear weight, loss of motion, loss of sensation, muscle weakness, numbness or tingling. The symptoms are aggravated by movement, weight bearing and palpation. She has tried NSAIDs and acetaminophen (PT sessions x 5weeks, muscle relaxant) for the symptoms. The treatment provided no relief.   Reviewed past Medical, Social and Family history today.  Outpatient Medications Prior to Visit  Medication Sig Dispense Refill  . amLODipine (NORVASC) 5 MG tablet Take 1 tablet (5 mg total) by mouth daily. 90 tablet 3  . fluticasone (FLONASE) 50 MCG/ACT nasal spray Place 1 spray into both nostrils daily. 16 g 5  . loratadine (CLARITIN) 10 MG tablet Take 10 mg by mouth daily.    Marland Kitchen losartan (COZAAR) 25 MG tablet Take 1 tablet (25 mg total) by mouth daily. 90 tablet 3  . Polyethyl Glycol-Propyl Glycol (SYSTANE) 0.4-0.3 % SOLN Apply 1 drop to eye as needed.  0  . triamcinolone ointment (KENALOG) 0.5 % Apply 1 application topically 2 (two) times daily. You may use for 2 weeks at a time as needed. 30 g 0  . Vitamin D, Ergocalciferol, 2000 units CAPS Take 1 capsule by mouth daily after breakfast. 90 capsule 1  . cyclobenzaprine (FLEXERIL) 5 MG tablet Take 1-2 tablets (5-10 mg total) by mouth at bedtime as needed for muscle spasms. 30 tablet 0  . Ibuprofen-Famotidine 800-26.6 MG TABS Take 1 tablet by mouth 2 (two) times daily with a meal. 90 tablet 3  . omeprazole (PRILOSEC) 20 MG capsule TAKE 1 CAPSULE(20 MG) BY  MOUTH DAILY 90 capsule 1  . SUMAtriptan (IMITREX) 25 MG tablet Take 1 tablet (25 mg total) by mouth every 2 (two) hours as needed for migraine. Do not use more than 100mg  in 24hrs. Need office visit for additional refills 10 tablet 0   No facility-administered medications prior to visit.    ROS See HPI  Objective:  BP 140/88 (BP Location: Left Arm, Patient Position: Sitting, Cuff Size: Large)   Pulse 82   Temp (!) 97.5 F (36.4 C) (Temporal)   Ht 5\' 6"  (1.676 m)   Wt 185 lb 12.8 oz (84.3 kg)   LMP 01/18/2012 (Exact Date)   SpO2 95%   BMI 29.99 kg/m   Physical Exam Vitals reviewed.  Musculoskeletal:        General: Tenderness present. No swelling or deformity.     Lumbar back: Normal.     Right hip: Tenderness present. No bony tenderness or crepitus. Normal range of motion. Normal strength.     Left hip: Normal.     Right upper leg: Normal.     Right knee: Normal.     Right lower leg: Normal. No edema.     Left lower leg: No edema.  Skin:    General: Skin is warm and dry.     Findings: No erythema or rash.  Neurological:     Mental Status: She is alert and oriented to person, place, and time.  Procedure Note :   Indication:  Joint osteoarthritis with refractory  chronic pain.  Risks including unsuccessful procedure , bleeding, infection, bruising, skin atrophy, "steroid flare-up" and others were explained to the patient in detail as well as the benefits. Verbal consent was obtained.  The patient was placed in a comfortable position. Lateral approach was used. Skin was prepped with Betadine and alcohol  and anesthetized a cooling spray. Then, a 10cc syringe with a 1.5 inch long 22-gauge needle was used for a joint injection. The needle was advanced over right lateral hip joint. I aspirated a small amount of intra-articular fluid to confirm correct placement of the needle and injected the joint with 63mL of 2% lidocaine and 20mg  of Depo-Medrol .  Band-Aid was applied.   Tolerated well. Complications: None. Good pain relief following the procedure.  Postprocedure instructions :   A Band-Aid should be left on for 12 hours. Injection therapy is not a cure itself. It is used in conjunction with other modalities. You can use nonsteroidal anti-inflammatories like ibuprofen , hot and cold compresses. Rest is recommended in the next 24 hours. You need to report immediately  if fever, chills or any signs of infection develop.  Assessment & Plan:  This visit occurred during the SARS-CoV-2 public health emergency.  Safety protocols were in place, including screening questions prior to the visit, additional usage of staff PPE, and extensive cleaning of exam room while observing appropriate contact time as indicated for disinfecting solutions.   Abigail [name] was seen today for acute visit.  Diagnoses and all orders for this visit:  Greater trochanteric bursitis of right hip -     Discontinue: methylPREDNISolone acetate (DEPO-MEDROL) injection 20 mg -     methylPREDNISolone acetate (DEPO-MEDROL) injection 20 mg  Influenza vaccine needed -     Flu Vaccine QUAD 6+ mos PF IM (Fluarix Quad PF)  Migraine without aura and without status migrainosus, not intractable -     SUMAtriptan (IMITREX) 25 MG tablet; Take 1 tablet (25 mg total) by mouth every 2 (two) hours as needed for migraine. Do not use more than 100mg  in 24hrs. Need office visit for additional refills  Gastroesophageal reflux disease without esophagitis -     omeprazole (PRILOSEC) 20 MG capsule; Take 1 capsule (20 mg total) by mouth daily.   Problem List Items Addressed This Visit      Cardiovascular and Mediastinum   Migraine without aura and without status migrainosus, not intractable    Stable Use of imitrex 1-2tabs 2-3x/month. Refill sent      Relevant Medications   SUMAtriptan (IMITREX) 25 MG tablet     Digestive   GERD (gastroesophageal reflux disease)    Stable with use of omeprazole 20mg   daily. Refill sent      Relevant Medications   omeprazole (PRILOSEC) 20 MG capsule    Other Visit Diagnoses    Greater trochanteric bursitis of right hip    -  Primary   Relevant Medications   methylPREDNISolone acetate (DEPO-MEDROL) injection 20 mg (Completed)   Influenza vaccine needed       Relevant Orders   Flu Vaccine QUAD 6+ mos PF IM (Fluarix Quad PF) (Completed)      Follow-up: Return if symptoms worsen or fail to improve.  Wilfred Lacy, NP

## 2020-03-20 NOTE — Assessment & Plan Note (Signed)
Stable with use of omeprazole 20mg  daily. Refill sent

## 2020-03-20 NOTE — Assessment & Plan Note (Signed)
Stable Use of imitrex 1-2tabs 2-3x/month. Refill sent

## 2020-04-02 ENCOUNTER — Encounter: Payer: Self-pay | Admitting: Nurse Practitioner

## 2020-04-02 DIAGNOSIS — M545 Low back pain, unspecified: Secondary | ICD-10-CM

## 2020-04-02 DIAGNOSIS — G8929 Other chronic pain: Secondary | ICD-10-CM

## 2020-04-02 DIAGNOSIS — M7061 Trochanteric bursitis, right hip: Secondary | ICD-10-CM

## 2020-04-03 ENCOUNTER — Other Ambulatory Visit: Payer: Self-pay | Admitting: Nurse Practitioner

## 2020-04-03 DIAGNOSIS — J302 Other seasonal allergic rhinitis: Secondary | ICD-10-CM

## 2020-04-03 MED ORDER — PREDNISONE 10 MG (21) PO TBPK: ORAL_TABLET | ORAL | 0 refills | Status: DC

## 2020-04-03 NOTE — Telephone Encounter (Signed)
Last OV 03/20/20 Last fill 08/23/18  #16g/5

## 2020-04-09 ENCOUNTER — Other Ambulatory Visit: Payer: Self-pay

## 2020-04-09 ENCOUNTER — Ambulatory Visit (INDEPENDENT_AMBULATORY_CARE_PROVIDER_SITE_OTHER): Payer: 59

## 2020-04-09 ENCOUNTER — Other Ambulatory Visit: Payer: 59

## 2020-04-09 DIAGNOSIS — M7061 Trochanteric bursitis, right hip: Secondary | ICD-10-CM | POA: Diagnosis not present

## 2020-04-19 NOTE — Addendum Note (Signed)
Addended by: Leana Gamer on: 04/19/2020 08:48 AM   Modules accepted: Orders

## 2020-05-01 ENCOUNTER — Encounter: Payer: Self-pay | Admitting: Orthopaedic Surgery

## 2020-05-01 ENCOUNTER — Ambulatory Visit: Payer: 59 | Admitting: Orthopaedic Surgery

## 2020-05-01 ENCOUNTER — Ambulatory Visit: Payer: Self-pay

## 2020-05-01 ENCOUNTER — Other Ambulatory Visit: Payer: Self-pay

## 2020-05-01 DIAGNOSIS — M5441 Lumbago with sciatica, right side: Secondary | ICD-10-CM | POA: Diagnosis not present

## 2020-05-01 NOTE — Addendum Note (Signed)
Addended by: Mal Misty L on: 05/01/2020 11:35 AM   Modules accepted: Orders

## 2020-05-01 NOTE — Progress Notes (Signed)
Office Visit Note   Patient: Abigail [name]           Date of Birth: 1970-06-18           MRN: [id number] Visit Date: 05/01/2020              Requested by: Flossie Buffy, NP [place],  [place] [address] PCP: Flossie Buffy, NP   Assessment & Plan: Visit Diagnoses:  1. Right-sided low back pain with right-sided sciatica, unspecified chronicity     Plan: Impression is chronic right hip and leg pain.  My impression is that it is coming from her back therefore we will need to obtain MRI at this point.  Follow-up after the MRI.  Follow-Up Instructions: Return if symptoms worsen or fail to improve.   Orders:  Orders Placed This Encounter  Procedures  . XR Lumbar Spine 2-3 Views   No orders of the defined types were placed in this encounter.     Procedures: No procedures performed   Clinical Data: No additional findings.   Subjective: Chief Complaint  Patient presents with  . Right Hip - Pain  . Lower Back - Pain    [name] is a very pleasant 49 year old female comes in with 6 months of right hip and thigh pain without groin pain.  Denies any injuries.  Denies any numbness and tingling.  She feels like the pain is deep in the back of the thigh.  She has tried physical therapy, ibuprofen, prednisone Dosepak, cortisone injection to the trochanteric bursa all with temporary and only minimal relief.  She denies any bowel or bladder complaints.   Review of Systems  Constitutional: Negative.   HENT: Negative.   Eyes: Negative.   Respiratory: Negative.   Cardiovascular: Negative.   Endocrine: Negative.   Musculoskeletal: Negative.   Neurological: Negative.   Hematological: Negative.   Psychiatric/Behavioral: Negative.   All other systems reviewed and are negative.    Objective: Vital Signs: LMP 01/18/2012 (Exact Date)   Physical Exam Vitals and nursing note reviewed.  Constitutional:      Appearance: She is well-developed and  well-nourished.  HENT:     Head: Normocephalic and atraumatic.  Eyes:     Extraocular Movements: EOM normal.  Pulmonary:     Effort: Pulmonary effort is normal.  Abdominal:     Palpations: Abdomen is soft.  Musculoskeletal:     Cervical back: Neck supple.  Skin:    General: Skin is warm.     Capillary Refill: Capillary refill takes less than 2 seconds.  Neurological:     Mental Status: She is alert and oriented to person, place, and time.  Psychiatric:        Mood and Affect: Mood and affect normal.        Behavior: Behavior normal.        Thought Content: Thought content normal.        Judgment: Judgment normal.     Ortho Exam Right hip shows full range of motion without pain.  No sciatic tension signs.  Trochanteric bursa is nontender.  Negative Faber.  Negative axial compression.  Negative FADIR.  Negative Stinchfield.  SI joint is nontender.  Lumbar spine is nontender. Specialty Comments:  No specialty comments available.  Imaging: XR Lumbar Spine 2-3 Views  Result Date: 05/01/2020 No acute or structural abnormalities.    PMFS History: Patient Active Problem List   Diagnosis Date Noted  . Chronic idiopathic constipation 08/29/2019  .  Intermittent palpitations 11/29/2018  . Migraine without aura and without status migrainosus, not intractable 10/19/2018  . Chronic right-sided low back pain without sciatica 12/13/2017  . Vitamin D insufficiency 07/27/2017  . Seasonal allergic rhinitis 07/27/2017  . Gallop rhythm 06/25/2017  . Generalized anxiety disorder 04/10/2016  . Hyperglycemia 04/10/2016  . GERD (gastroesophageal reflux disease) 04/10/2016  . Environmental allergies 06/17/2012  . S/p Abdominal Supracervical Hysterectomy, Left Salpingoohorectomy, Right Salpingectomy on 01/18/12 01/18/2012  . HTN (hypertension) 10/29/2011   Past Medical History:  Diagnosis Date  . Anemia    iron supp  . Anxiety   . Depression   . Fibroids   . GERD (gastroesophageal  reflux disease)    omeprazole  . Headache(784.0)   . Hypertension 2012   Maxide  . S/P LEEP 1992  . Urticaria     Family History  Problem Relation Age of Onset  . Hypertension Father   . Depression Father   . Diabetes Father   . Alcohol abuse Father   . Colonic polyp Father   . Diabetes Sister   . Diabetes Brother   . Hypertension Mother   . Arthritis Mother   . Pulmonary fibrosis Mother   . Hypertension Sister   . Cancer Paternal Grandmother        breast  . Heart attack Paternal Grandmother   . Cancer Daughter        breast  . Allergic rhinitis Neg Hx   . Angioedema Neg Hx   . Asthma Neg Hx   . Eczema Neg Hx   . Immunodeficiency Neg Hx   . Urticaria Neg Hx     Past Surgical History:  Procedure Laterality Date  . Inicision and drainage of thrombosed hemorrhoid  04/06/2018  . LEEP  1998   Abnormal pap  . SUPRACERVICAL ABDOMINAL HYSTERECTOMY  01/18/2012   Procedure: HYSTERECTOMY SUPRACERVICAL ABDOMINAL with LSO;  Surgeon: Osborne Oman, MD;  Location: [facility];  Service: Gynecology;  Laterality: N/A;  . TUBAL LIGATION  1997  . WISDOM TOOTH EXTRACTION     Social History   Occupational History  . Occupation: MEDICAL ASSISTANT    Employer: [employer]  Tobacco Use  . Smoking status: Never Smoker  . Smokeless tobacco: Never Used  Vaping Use  . Vaping Use: Never used  Substance and Sexual Activity  . Alcohol use: Yes    Alcohol/week: 1.0 standard drink    Types: 1 Standard drinks or equivalent per week    Comment: occasional  . Drug use: No  . Sexual activity: Yes    Birth control/protection: Surgical    Comment: Hysterectomy

## 2020-05-11 DIAGNOSIS — U071 COVID-19: Secondary | ICD-10-CM

## 2020-05-11 HISTORY — DX: COVID-19: U07.1

## 2020-05-15 ENCOUNTER — Telehealth (INDEPENDENT_AMBULATORY_CARE_PROVIDER_SITE_OTHER): Payer: 59 | Admitting: Nurse Practitioner

## 2020-05-15 ENCOUNTER — Encounter: Payer: Self-pay | Admitting: Nurse Practitioner

## 2020-05-15 VITALS — BP 126/68 | HR 99 | Temp 98.0°F | Ht 66.0 in | Wt 183.0 lb

## 2020-05-15 DIAGNOSIS — J069 Acute upper respiratory infection, unspecified: Secondary | ICD-10-CM | POA: Diagnosis not present

## 2020-05-15 DIAGNOSIS — G43009 Migraine without aura, not intractable, without status migrainosus: Secondary | ICD-10-CM | POA: Diagnosis not present

## 2020-05-15 DIAGNOSIS — Z20822 Contact with and (suspected) exposure to covid-19: Secondary | ICD-10-CM | POA: Diagnosis not present

## 2020-05-15 MED ORDER — ACETAMINOPHEN-CODEINE #3 300-30 MG PO TABS: 1.0000 | ORAL_TABLET | Freq: Three times a day (TID) | ORAL | 0 refills | Status: AC | PRN

## 2020-05-15 NOTE — Progress Notes (Addendum)
Virtual Visit via Video Note  I connected with@ on 05/15/20 at  2:00 PM EST by a video enabled telemedicine application and verified that I am speaking with the correct person using two identifiers.  Location: Patient:Home Provider: Office Participants: patient and provider  I discussed the limitations of evaluation and management by telemedicine and the availability of in person appointments. I also discussed with the patient that there may be a patient responsible charge related to this service. The patient expressed understanding and agreed to proceed.  CC:Pt c/o chills, sweats, body aches, and low grade fever (99.8), fatigue, and severe headaches. Pt has took tylenol with no relief. Pt had COVID test yesterday but has not received results yet.   History of Present Illness: Symptom onset 05/12/2020 covid test collected: 07/2020, pending results. 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 The current episode started in the past 7 days. The problem has been unchanged. The maximum temperature recorded prior to her arrival was 100.4 - 100.9 F. The fever has been present for 1 to 2 days. Associated symptoms include congestion, coughing, headaches, joint pain, rhinorrhea and sinus pain. Pertinent negatives include no abdominal pain, chest pain, diarrhea, dysuria, ear pain, joint swelling, nausea, neck pain, plugged ear sensation, rash, sneezing, sore throat, swollen glands, vomiting or wheezing. She has tried acetaminophen, decongestant, increased fluids and sleep for the symptoms. The treatment provided no relief.  no SOB or chest pain or dizziness or palpitations. OTC medications used: mucinex D, alkaseltzer, excedrin and tylenol   Observations/Objective: Physical Exam Constitutional:      General: She is not in acute distress. Cardiovascular:     Pulses: Normal  pulses.  Pulmonary:     Effort: Pulmonary effort is normal.  Neurological:     Mental Status: She is alert and oriented to person, place, and time.    Assessment and Plan: Sharea was seen today for acute visit.  Diagnoses and all orders for this visit:  Close exposure to COVID-19 virus  Viral upper respiratory tract infection  Migraine without aura and without status migrainosus, not intractable -     acetaminophen-codeine (TYLENOL #3) 300-30 MG tablet; Take 1-2 tablets by mouth every 8 (eight) hours as needed for up to 5 days for moderate pain.   Follow Up Instructions: See above Work letter provided Maintain adequate oral hydration. Call office with COVID results Call office if symptoms worsen of do not improvement in 3-5days   I discussed the assessment and treatment plan with the patient. The patient was provided an opportunity to ask questions and all were answered. The patient agreed with the plan and demonstrated an understanding of the instructions.   The patient was advised to call back or seek an in-person evaluation if the symptoms worsen or if the condition fails to improve as anticipated.  Alysia Penna, NP

## 2020-05-15 NOTE — Patient Instructions (Addendum)
Supportive therapy is recommended at this time. Maintain adequate oral hydration. Call office with COVID results Call office if symptoms worsen of do not improvement in 3-5days.  10 Things You Can Do to Manage Your COVID-19 Symptoms at Home If you have possible or confirmed COVID-19: 1. Stay home from work and school. And stay away from other public places. If you must go out, avoid using any kind of public transportation, ridesharing, or taxis. 2. Monitor your symptoms carefully. If your symptoms get worse, call your healthcare provider immediately. 3. Get rest and stay hydrated. 4. If you have a medical appointment, call the healthcare provider ahead of time and tell them that you have or may have COVID-19. 5. For medical emergencies, call 911 and notify the dispatch personnel that you have or may have COVID-19. 6. Cover your cough and sneezes with a tissue or use the inside of your elbow. 7. Wash your hands often with soap and water for at least 20 seconds or clean your hands with an alcohol-based hand sanitizer that contains at least 60% alcohol. 8. As much as possible, stay in a specific room and away from other people in your home. Also, you should use a separate bathroom, if available. If you need to be around other people in or outside of the home, wear a mask. 9. Avoid sharing personal items with other people in your household, like dishes, towels, and bedding. 10. Clean all surfaces that are touched often, like counters, tabletops, and doorknobs. Use household cleaning sprays or wipes according to the label instructions. [employer] 11/09/2018 This information is not intended to replace advice given to you by your health care provider. Make sure you discuss any questions you have with your health care provider. Document Revised: 04/13/2019 Document Reviewed: 04/13/2019 Elsevier Patient Education  2020 [employer].

## 2020-05-16 ENCOUNTER — Encounter: Payer: Self-pay | Admitting: Nurse Practitioner

## 2020-05-17 NOTE — Telephone Encounter (Signed)
Spoke with patient who states that she seems to be feeling a lot better from how she did yesterday. Encouraged plenty of fluids and rest patient agrees to go to hospital if need be.

## 2020-05-17 NOTE — Telephone Encounter (Signed)
Please advise to go to hospital if she if weak and worsening pain

## 2020-05-23 ENCOUNTER — Encounter: Payer: Self-pay | Admitting: Nurse Practitioner

## 2020-05-25 ENCOUNTER — Ambulatory Visit (HOSPITAL_BASED_OUTPATIENT_CLINIC_OR_DEPARTMENT_OTHER)
Admission: RE | Admit: 2020-05-25 | Discharge: 2020-05-25 | Disposition: A | Discharge status: Home / Self Care | Payer: 59 | Source: Ambulatory Visit | Attending: Orthopaedic Surgery | Admitting: Orthopaedic Surgery

## 2020-05-25 ENCOUNTER — Other Ambulatory Visit: Payer: Self-pay

## 2020-05-25 DIAGNOSIS — M5441 Lumbago with sciatica, right side: Secondary | ICD-10-CM | POA: Insufficient documentation

## 2020-05-29 ENCOUNTER — Other Ambulatory Visit: Payer: Self-pay

## 2020-05-29 ENCOUNTER — Ambulatory Visit: Payer: 59 | Admitting: Orthopaedic Surgery

## 2020-05-29 ENCOUNTER — Encounter: Payer: Self-pay | Admitting: Orthopaedic Surgery

## 2020-05-29 DIAGNOSIS — M79604 Pain in right leg: Secondary | ICD-10-CM | POA: Diagnosis not present

## 2020-05-29 DIAGNOSIS — M5441 Lumbago with sciatica, right side: Secondary | ICD-10-CM

## 2020-05-29 NOTE — Progress Notes (Signed)
Office Visit Note   Patient: Abigail [name]           Date of Birth: [birth year]           MRN: [id number] Visit Date: 05/29/2020              Requested by: Flossie Buffy, NP [place],  [place] [address] PCP: Flossie Buffy, NP   Assessment & Plan: Visit Diagnoses:  1. Pain in right leg     Plan: Impression this L4-5 spondylosis and disc bulge as well as moderate facet arthropathy.  At this point, we will refer the patient to Dr. Ernestina Patches for [facility] versus facet block.  Follow-up with [place] as needed.  Follow-Up Instructions: Return for with Dr. Ernestina Patches for ESI vs facet block.   Orders:  No orders of the defined types were placed in this encounter.  No orders of the defined types were placed in this encounter.     Procedures: No procedures performed   Clinical Data: No additional findings.   Subjective: Chief Complaint  Patient presents with  . Lower Back - Pain, Follow-up    HPI patient is a pleasant 50 year old female who comes in today to discuss MRI results of lumbar spine.  She has been dealing with deep-seated right posterior thigh pain for a while.  She has been in physical therapy as well as tried NSAIDs and steroids without long-lasting relief of symptoms.  Recent MRI of the lumbar spine shows spondylosis worse at L4-5 where there is a mild central canal stenosis due to disc bulge.  She also has moderate facet arthropathy at L4-5.  No previous ESI or facet block.     Objective: Vital Signs: LMP 01/18/2012 (Exact Date)     Ortho Exam stable lumbar exam  Specialty Comments:  No specialty comments available.  Imaging: No new imaging   PMFS History: Patient Active Problem List   Diagnosis Date Noted  . Chronic idiopathic constipation 08/29/2019  . Intermittent palpitations 11/29/2018  . Migraine without aura and without status migrainosus, not intractable 10/19/2018  . Chronic right-sided low back pain without sciatica  12/13/2017  . Vitamin D insufficiency 07/27/2017  . Seasonal allergic rhinitis 07/27/2017  . Gallop rhythm 06/25/2017  . Generalized anxiety disorder 04/10/2016  . Hyperglycemia 04/10/2016  . GERD (gastroesophageal reflux disease) 04/10/2016  . Environmental allergies 06/17/2012  . S/p Abdominal Supracervical Hysterectomy, Left Salpingoohorectomy, Right Salpingectomy on 01/18/12 01/18/2012  . HTN (hypertension) 10/29/2011   Past Medical History:  Diagnosis Date  . Anemia    iron supp  . Anxiety   . Depression   . Fibroids   . GERD (gastroesophageal reflux disease)    omeprazole  . Headache(784.0)   . Hypertension 2012   Maxide  . S/P LEEP 1992  . Urticaria     Family History  Problem Relation Age of Onset  . Hypertension Father   . Depression Father   . Diabetes Father   . Alcohol abuse Father   . Colonic polyp Father   . Diabetes Sister   . Diabetes Brother   . Hypertension Mother   . Arthritis Mother   . Pulmonary fibrosis Mother   . Hypertension Sister   . Cancer Paternal Grandmother        breast  . Heart attack Paternal Grandmother   . Cancer Daughter        breast  . Allergic rhinitis Neg Hx   . Angioedema Neg Hx   .  Asthma Neg Hx   . Eczema Neg Hx   . Immunodeficiency Neg Hx   . Urticaria Neg Hx     Past Surgical History:  Procedure Laterality Date  . Inicision and drainage of thrombosed hemorrhoid  04/06/2018  . LEEP  1998   Abnormal pap  . SUPRACERVICAL ABDOMINAL HYSTERECTOMY  01/18/2012   Procedure: HYSTERECTOMY SUPRACERVICAL ABDOMINAL with LSO;  Surgeon: Osborne Oman, MD;  Location: [facility];  Service: Gynecology;  Laterality: N/A;  . TUBAL LIGATION  1997  . WISDOM TOOTH EXTRACTION     Social History   Occupational History  . Occupation: MEDICAL ASSISTANT    Employer: [employer]  Tobacco Use  . Smoking status: Never Smoker  . Smokeless tobacco: Never Used  Vaping Use  . Vaping Use: Never used  Substance and Sexual Activity  .  Alcohol use: Yes    Alcohol/week: 1.0 standard drink    Types: 1 Standard drinks or equivalent per week    Comment: occasional  . Drug use: No  . Sexual activity: Yes    Birth control/protection: Surgical    Comment: Hysterectomy

## 2020-06-04 ENCOUNTER — Ambulatory Visit: Payer: 59 | Admitting: Nurse Practitioner

## 2020-06-04 ENCOUNTER — Telehealth: Payer: Self-pay

## 2020-06-04 NOTE — Telephone Encounter (Signed)
Patient called she is returning you call about scheduling a appointment. TK:[phone]

## 2020-06-04 NOTE — Telephone Encounter (Signed)
Called pt and sch 2/17

## 2020-06-18 ENCOUNTER — Ambulatory Visit: Payer: 59 | Admitting: Nurse Practitioner

## 2020-06-27 ENCOUNTER — Encounter: Payer: Self-pay | Admitting: Physical Medicine and Rehabilitation

## 2020-06-27 ENCOUNTER — Other Ambulatory Visit: Payer: Self-pay

## 2020-06-27 ENCOUNTER — Ambulatory Visit: Payer: Self-pay

## 2020-06-27 ENCOUNTER — Ambulatory Visit: Payer: 59 | Admitting: Physical Medicine and Rehabilitation

## 2020-06-27 VITALS — BP 145/94 | HR 79

## 2020-06-27 DIAGNOSIS — M47816 Spondylosis without myelopathy or radiculopathy, lumbar region: Secondary | ICD-10-CM | POA: Diagnosis not present

## 2020-06-27 MED ORDER — BETAMETHASONE SOD PHOS & ACET 6 (3-3) MG/ML IJ SUSP
12.0000 mg | Freq: Once | INTRAMUSCULAR | Status: AC
  Administered 2020-06-27: 12 mg

## 2020-06-27 NOTE — Progress Notes (Signed)
Patient states pain is now the right leg. No numbness or tingling. No pain in lower back. Patient states ibu, heat and a stimulator that was bought off [employer] helps some. Patient states climbing stairs and walking increases pain      Numeric Pain Rating Scale and Functional Assessment Average Pain (1)-dull ache pain   In the last MONTH (on 0-10 scale) has pain interfered with the following?  1. General activity like being  able to carry out your everyday physical activities such as walking, climbing stairs, carrying groceries, or moving a chair?  Rating(5)   +Driver, -BT, -Dye Allergies.

## 2020-06-27 NOTE — Patient Instructions (Signed)

## 2020-06-27 NOTE — Progress Notes (Signed)
Abigail [name] - 50 y.o. female MRN [id number]  Date of birth: Jun 01, 1970  Office Visit Note: Visit Date: 06/27/2020 PCP: Flossie Buffy, NP Referred by: Flossie Buffy, NP  Subjective: Chief Complaint  Patient presents with  . Lower Back - Pain   HPI:  Abigail [name] is a 50 y.o. female who comes in today at the request of Dr. Eduard Roux for planned Right  L4-L5 Lumbar facet/medial branch block with fluoroscopic guidance.  The patient has failed conservative care including home exercise, medications, time and activity modification.  This injection will be diagnostic and hopefully therapeutic.  Please see requesting physician notes for further details and justification.  Exam has shown concordant pain with facet joint loading.  MRI reviewed with images and spine model.  MRI reviewed in the note below.   Consider Flouroscopic or ultrasound guided greater trochanter injection vs. L5-S1 interlaminae epidural steroid injection if no relief.   ROS Otherwise per HPI.  Assessment & Plan: Visit Diagnoses:    ICD-10-CM   1. Spondylosis without myelopathy or radiculopathy, lumbar region  M47.816 XR C-ARM NO REPORT    betamethasone acetate-betamethasone sodium phosphate (CELESTONE) injection 12 mg    Facet Injection    Plan: No additional findings.   Meds & Orders:  Meds ordered this encounter  Medications  . betamethasone acetate-betamethasone sodium phosphate (CELESTONE) injection 12 mg    Orders Placed This Encounter  Procedures  . Facet Injection  . XR C-ARM NO REPORT    Follow-up: Return if symptoms worsen or fail to improve.   Procedures: No procedures performed  Lumbar Diagnostic Facet Joint Nerve Block with Fluoroscopic Guidance   Patient: Abigail [name]      Date of Birth: Jul 03, 1970 MRN: [id number] PCP: Flossie Buffy, NP      Visit Date: 06/27/2020   Universal Protocol:    Date/Time: 02/17/228:56 AM  Consent Given By: the patient  Position:  PRONE  Additional Comments: Vital signs were monitored before and after the procedure. Patient was prepped and draped in the usual sterile fashion. The correct patient, procedure, and site was verified.   Injection Procedure Details:   Procedure diagnoses:  1. Spondylosis without myelopathy or radiculopathy, lumbar region      Meds Administered:  Meds ordered this encounter  Medications  . betamethasone acetate-betamethasone sodium phosphate (CELESTONE) injection 12 mg     Laterality: Right  Location/Site: L4-L5, L3 and L4 medial branches  Needle: 5.0 in., 25 ga.  Short bevel or Quincke spinal needle  Needle Placement: Oblique pedical  Findings:   -Comments: There was excellent flow of contrast along the articular pillars without intravascular flow.  Procedure Details: The fluoroscope beam is vertically oriented in AP and then obliqued 15 to 20 degrees to the ipsilateral side of the desired nerve to achieve the "Scotty dog" appearance.  The skin over the target area of the junction of the superior articulating process and the transverse process (sacral ala if blocking the L5 dorsal rami) was locally anesthetized with a 1 ml volume of 1% Lidocaine without Epinephrine.  The spinal needle was inserted and advanced in a trajectory view down to the target.   After contact with periosteum and negative aspirate for blood and CSF, correct placement without intravascular or epidural spread was confirmed by injecting 0.5 ml. of Isovue-250.  A spot radiograph was obtained of this image.    Next, a 0.5 ml. volume of the injectate described above was injected. The needle was then redirected to  the other facet joint nerves mentioned above if needed.  Prior to the procedure, the patient was given a Pain Diary which was completed for baseline measurements.  After the procedure, the patient rated their pain every 30 minutes and will continue rating at this frequency for a total of 5 hours.  The  patient has been asked to complete the Diary and return to [place] by mail, fax or hand delivered as soon as possible.   Additional Comments:  The patient tolerated the procedure well Dressing: 2 x 2 sterile gauze and Band-Aid    Post-procedure details: Patient was observed during the procedure. Post-procedure instructions were reviewed.  Patient left the clinic in stable condition.    Clinical History: MRI LUMBAR SPINE WITHOUT CONTRAST  TECHNIQUE: Multiplanar, multisequence MR imaging of the lumbar spine was performed. No intravenous contrast was administered.  COMPARISON:  None.  FINDINGS: Segmentation:  Standard.  Alignment:  Normal.  Vertebrae:  No fracture, evidence of discitis, or bone lesion.  Conus medullaris and cauda equina: Conus extends to the L1 level. Conus and cauda equina appear normal.  Paraspinal and other soft tissues: Negative.  Disc levels:  T11-12 and T12-L1 are imaged in the sagittal plane only. There is a very shallow left paracentral protrusion at T11-12 without stenosis. T12-L1 is negative.  L1-2: Mild facet degenerative change.  Otherwise negative.  L2-3: Mild-to-moderate facet degenerative change. Otherwise negative.  L3-4: Mild-to-moderate facet degenerative change otherwise negative.  L4-5: Moderate facet arthropathy, shallow disc bulge and ligamentum flavum thickening. Mild central canal stenosis. The foramina are open.  L5-S1: Mild facet degenerative change.  Otherwise negative.  IMPRESSION: Spondylosis is most notable at L4-5 where there is mild central canal stenosis due to a shallow disc bulge and ligamentum flavum thickening. No nerve root compression is identified. Moderate facet arthropathy at this level noted.   Electronically Signed   By: Inge Rise M.D.   On: 05/25/2020 15:25     Objective:  VS:  HT:    WT:   BMI:     BP:(!) 145/94  HR:79bpm  TEMP: ( )  RESP:  Physical Exam Vitals  and nursing note reviewed.  Constitutional:      General: She is not in acute distress.    Appearance: Normal appearance. She is obese. She is not ill-appearing.  HENT:     Head: Normocephalic and atraumatic.     Right Ear: External ear normal.     Left Ear: External ear normal.  Eyes:     Extraocular Movements: Extraocular movements intact.  Cardiovascular:     Rate and Rhythm: Normal rate.     Pulses: Normal pulses.  Pulmonary:     Effort: Pulmonary effort is normal. No respiratory distress.  Abdominal:     General: There is no distension.     Palpations: Abdomen is soft.  Musculoskeletal:        General: Tenderness present.     Cervical back: Neck supple.     Right lower leg: No edema.     Left lower leg: No edema.     Comments: Patient has good distal strength with no pain over the greater trochanters.  No clonus or focal weakness. Patient somewhat slow to rise from a seated position to full extension.  There is concordant low back pain with facet loading and lumbar spine extension rotation.  There are no definitive trigger points but the patient is somewhat tender across the lower back and PSIS.  There is no pain  with hip rotation.   Skin:    Findings: No erythema, lesion or rash.  Neurological:     General: No focal deficit present.     Mental Status: She is alert and oriented to person, place, and time.     Sensory: No sensory deficit.     Motor: No weakness or abnormal muscle tone.     Coordination: Coordination normal.  Psychiatric:        Mood and Affect: Mood normal.        Behavior: Behavior normal.      Imaging: No results found.

## 2020-06-27 NOTE — Procedures (Signed)
Lumbar Diagnostic Facet Joint Nerve Block with Fluoroscopic Guidance   Patient: Abigail [name]      Date of Birth: May 13, 1970 MRN: [id number] PCP: Flossie Buffy, NP      Visit Date: 06/27/2020   Universal Protocol:    Date/Time: 02/17/228:56 AM  Consent Given By: the patient  Position: PRONE  Additional Comments: Vital signs were monitored before and after the procedure. Patient was prepped and draped in the usual sterile fashion. The correct patient, procedure, and site was verified.   Injection Procedure Details:   Procedure diagnoses:  1. Spondylosis without myelopathy or radiculopathy, lumbar region      Meds Administered:  Meds ordered this encounter  Medications  . betamethasone acetate-betamethasone sodium phosphate (CELESTONE) injection 12 mg     Laterality: Right  Location/Site: L4-L5, L3 and L4 medial branches  Needle: 5.0 in., 25 ga.  Short bevel or Quincke spinal needle  Needle Placement: Oblique pedical  Findings:   -Comments: There was excellent flow of contrast along the articular pillars without intravascular flow.  Procedure Details: The fluoroscope beam is vertically oriented in AP and then obliqued 15 to 20 degrees to the ipsilateral side of the desired nerve to achieve the "Scotty dog" appearance.  The skin over the target area of the junction of the superior articulating process and the transverse process (sacral ala if blocking the L5 dorsal rami) was locally anesthetized with a 1 ml volume of 1% Lidocaine without Epinephrine.  The spinal needle was inserted and advanced in a trajectory view down to the target.   After contact with periosteum and negative aspirate for blood and CSF, correct placement without intravascular or epidural spread was confirmed by injecting 0.5 ml. of Isovue-250.  A spot radiograph was obtained of this image.    Next, a 0.5 ml. volume of the injectate described above was injected. The needle was then redirected to  the other facet joint nerves mentioned above if needed.  Prior to the procedure, the patient was given a Pain Diary which was completed for baseline measurements.  After the procedure, the patient rated their pain every 30 minutes and will continue rating at this frequency for a total of 5 hours.  The patient has been asked to complete the Diary and return to [place] by mail, fax or hand delivered as soon as possible.   Additional Comments:  The patient tolerated the procedure well Dressing: 2 x 2 sterile gauze and Band-Aid    Post-procedure details: Patient was observed during the procedure. Post-procedure instructions were reviewed.  Patient left the clinic in stable condition.

## 2020-07-04 ENCOUNTER — Ambulatory Visit: Payer: 59 | Admitting: Nurse Practitioner

## 2020-07-04 ENCOUNTER — Other Ambulatory Visit: Payer: Self-pay

## 2020-07-04 ENCOUNTER — Other Ambulatory Visit: Payer: Self-pay | Admitting: Nurse Practitioner

## 2020-07-04 ENCOUNTER — Encounter: Payer: Self-pay | Admitting: Nurse Practitioner

## 2020-07-04 VITALS — BP 158/100 | HR 86 | Temp 97.0°F | Ht 66.0 in | Wt 180.4 lb

## 2020-07-04 DIAGNOSIS — I10 Essential (primary) hypertension: Secondary | ICD-10-CM

## 2020-07-04 DIAGNOSIS — G43009 Migraine without aura, not intractable, without status migrainosus: Secondary | ICD-10-CM

## 2020-07-04 DIAGNOSIS — R7989 Other specified abnormal findings of blood chemistry: Secondary | ICD-10-CM

## 2020-07-04 LAB — BASIC METABOLIC PANEL
BUN: 17 mg/dL (ref 6–23)
CO2: 29 mEq/L (ref 19–32)
Calcium: 9.4 mg/dL (ref 8.4–10.5)
Chloride: 99 mEq/L (ref 96–112)
Creatinine, Ser: 0.87 mg/dL (ref 0.40–1.20)
GFR: 77.51 mL/min (ref >60.00)
Glucose, Bld: 97 mg/dL (ref 70–99)
Potassium: 4.1 mEq/L (ref 3.5–5.1)
Sodium: 136 mEq/L (ref 135–145)

## 2020-07-04 LAB — TSH: TSH: 0.34 u[IU]/mL — ABNORMAL LOW (ref 0.35–4.50)

## 2020-07-04 MED ORDER — TRAMADOL HCL 50 MG PO TABS: 50.0000 mg | ORAL_TABLET | Freq: Three times a day (TID) | ORAL | 0 refills | Status: AC | PRN

## 2020-07-04 MED ORDER — AMLODIPINE BESYLATE 10 MG PO TABS
10.0000 mg | ORAL_TABLET | Freq: Every day | ORAL | 3 refills | Status: DC
Start: 2020-07-04 — End: 2021-08-18

## 2020-07-04 NOTE — Progress Notes (Signed)
Subjective:  Patient ID: Abigail [name], female    DOB: [birth year]  Age: 50 y.o. MRN: [id number]  CC: Acute Visit (Pt states her BP has been elevated over the past week. When checking it was 163/102 and a few days ago it was 172/96. Pt also c/o headaches that comes and goes and she associates it with the change in the weather. )  HPI  HTN (hypertension) Elevated BP with headaches. Denies any OTC medication Reports she has been compliant with amlodipine and losartan. BP Readings from Last 3 Encounters:  07/04/20 (!) 158/100  06/27/20 (!) 145/94  05/15/20 126/68   repeat BMP and TSh Increase amlodipine to 10mg  daily Maintain losartan dose Monitor BP in AM and record Bring to next office visit. Maintain DASH diet and adequate oral hydration. Avoid use of OTC NSAIDs. Do not take imitrex while BP is elevated Use tylenol 650mg  or tramadol 50mg   every 8hrs as needed for headache. F/up in 2weeks Given ED precautions   Reviewed past Medical, Social and Family history today.  Outpatient Medications Prior to Visit  Medication Sig Dispense Refill  . fluticasone (FLONASE) 50 MCG/ACT nasal spray SHAKE LIQUID AND USE 1 SPRAY IN EACH NOSTRIL DAILY 16 g 5  . loratadine (CLARITIN) 10 MG tablet Take 10 mg by mouth daily.    Marland Kitchen losartan (COZAAR) 25 MG tablet Take 1 tablet (25 mg total) by mouth daily. 90 tablet 3  . omeprazole (PRILOSEC) 20 MG capsule Take 1 capsule (20 mg total) by mouth daily. 90 capsule 1  . SUMAtriptan (IMITREX) 25 MG tablet Take 1 tablet (25 mg total) by mouth every 2 (two) hours as needed for migraine. Do not use more than 100mg  in 24hrs. Need office visit for additional refills 10 tablet 1  . triamcinolone ointment (KENALOG) 0.5 % Apply 1 application topically 2 (two) times daily. You may use for 2 weeks at a time as needed. 30 g 0  . Vitamin D, Ergocalciferol, 2000 units CAPS Take 1 capsule by mouth daily after breakfast. 90 capsule 1  . amLODipine (NORVASC) 5 MG tablet  Take 1 tablet (5 mg total) by mouth daily. 90 tablet 3  . Polyethyl Glycol-Propyl Glycol (SYSTANE) 0.4-0.3 % SOLN Apply 1 drop to eye as needed. (Patient not taking: Reported on 07/04/2020)  0   No facility-administered medications prior to visit.   ROS See HPI  Objective:  BP (!) 158/100 (BP Location: Left Arm, Patient Position: Sitting, Cuff Size: Normal)   Pulse 86   Temp (!) 97 F (36.1 C) (Temporal)   Ht 5\' 6"  (1.676 m)   Wt 180 lb 6.4 oz (81.8 kg)   LMP 01/18/2012 (Exact Date)   SpO2 96%   BMI 29.12 kg/m   Physical Exam Constitutional:      General: She is not in acute distress. Eyes:     Extraocular Movements: Extraocular movements intact.     Conjunctiva/sclera: Conjunctivae normal.  Cardiovascular:     Pulses: Normal pulses.     Heart sounds: Normal heart sounds.  Pulmonary:     Effort: Pulmonary effort is normal.     Breath sounds: Normal breath sounds.  Musculoskeletal:     Right lower leg: No edema.     Left lower leg: No edema.  Neurological:     Mental Status: She is alert and oriented to person, place, and time.     Cranial Nerves: No cranial nerve deficit.  Psychiatric:        Mood and  Affect: Mood normal.        Behavior: Behavior normal.        Thought Content: Thought content normal.    Assessment & Plan:  This visit occurred during the SARS-CoV-2 public health emergency.  Safety protocols were in place, including screening questions prior to the visit, additional usage of staff PPE, and extensive cleaning of exam room while observing appropriate contact time as indicated for disinfecting solutions.   [place]ka was seen today for acute visit.  Diagnoses and all orders for this visit:  Primary hypertension -     Basic metabolic panel -     TSH  Essential hypertension -     amLODipine (NORVASC) 10 MG tablet; Take 1 tablet (10 mg total) by mouth daily.  Migraine without aura and without status migrainosus, not intractable -     traMADol (ULTRAM)  50 MG tablet; Take 1 tablet (50 mg total) by mouth every 8 (eight) hours as needed for up to 3 days.    Problem List Items Addressed This Visit      Cardiovascular and Mediastinum   HTN (hypertension) - Primary    Elevated BP with headaches. Denies any OTC medication Reports she has been compliant with amlodipine and losartan. BP Readings from Last 3 Encounters:  07/04/20 (!) 158/100  06/27/20 (!) 145/94  05/15/20 126/68   repeat BMP and TSh Increase amlodipine to 10mg  daily Maintain losartan dose Monitor BP in AM and record Bring to next office visit. Maintain DASH diet and adequate oral hydration. Avoid use of OTC NSAIDs. Do not take imitrex while BP is elevated Use tylenol 650mg  or tramadol 50mg   every 8hrs as needed for headache. F/up in 2weeks Given ED precautions      Relevant Medications   amLODipine (NORVASC) 10 MG tablet   Other Relevant Orders   Basic metabolic panel   TSH   Migraine without aura and without status migrainosus, not intractable   Relevant Medications   amLODipine (NORVASC) 10 MG tablet   traMADol (ULTRAM) 50 MG tablet    Other Visit Diagnoses    Essential hypertension       Relevant Medications   amLODipine (NORVASC) 10 MG tablet      Follow-up: Return in about 2 weeks (around 07/18/2020) for HTN.  Wilfred Lacy, NP

## 2020-07-04 NOTE — Assessment & Plan Note (Addendum)
Elevated BP with headaches. Denies any OTC medication Reports she has been compliant with amlodipine and losartan. BP Readings from Last 3 Encounters:  07/04/20 (!) 158/100  06/27/20 (!) 145/94  05/15/20 126/68   repeat BMP and TSh Increase amlodipine to 10mg  daily Maintain losartan dose Monitor BP in AM and record Bring to next office visit. Maintain DASH diet and adequate oral hydration. Avoid use of OTC NSAIDs. Do not take imitrex while BP is elevated Use tylenol 650mg  or tramadol 50mg   every 8hrs as needed for headache. F/up in 2weeks Given ED precautions

## 2020-07-04 NOTE — Patient Instructions (Addendum)
Go to lab for blood draw Increase amlodipine to 10mg  daily Maintain losartan dose Monitor BP in AM and record Bring to next office visit. Maintain DASH diet and adequate oral hydration. Avoid use of OTC NSAIDs.  Do not take imitrex while BP is elevated Use tylenol 650mg  or tramadol 50mg   every 8hrs as needed for headache.

## 2020-07-19 ENCOUNTER — Ambulatory Visit: Payer: 59 | Admitting: Nurse Practitioner

## 2020-07-23 ENCOUNTER — Encounter: Payer: Self-pay | Admitting: Physical Medicine and Rehabilitation

## 2020-07-23 ENCOUNTER — Encounter: Payer: Self-pay | Admitting: Nurse Practitioner

## 2020-07-23 ENCOUNTER — Other Ambulatory Visit: Payer: Self-pay

## 2020-07-23 ENCOUNTER — Ambulatory Visit: Payer: 59 | Admitting: Nurse Practitioner

## 2020-07-23 VITALS — BP 126/74 | HR 79 | Temp 97.5°F | Ht 66.0 in | Wt 183.0 lb

## 2020-07-23 DIAGNOSIS — G43009 Migraine without aura, not intractable, without status migrainosus: Secondary | ICD-10-CM | POA: Diagnosis not present

## 2020-07-23 DIAGNOSIS — I10 Essential (primary) hypertension: Secondary | ICD-10-CM

## 2020-07-23 NOTE — Assessment & Plan Note (Signed)
Improved with BP control Advised to hold imitrex Use tylenol for mild headache and tramadol for severe headache F/up in 89month

## 2020-07-23 NOTE — Patient Instructions (Signed)
Schedule lab appt for repeat thyroid panel in 2weeks F/up with me in 4weeks. Maintain current medication dose.

## 2020-07-23 NOTE — Progress Notes (Signed)
Subjective:  Patient ID: Abigail [name], female    DOB: 1970-10-01  Age: 50 y.o. MRN: [id number]  CC: Follow-up (F/u on HTN, pt has been checking BP at home and the lowest has been 132/82 and the highest was 148/92. )  HPI  HTN (hypertension) Improved with current dose of amlodipine and losartan. Home BP readings: 132/82 to 148/92 Resolved severe headache  Maintain current medication and DASH diet Check BP 2-3x/week. F/up in 51month  Migraine without aura and without status migrainosus, not intractable Improved with BP control Advised to hold imitrex Use tylenol for mild headache and tramadol for severe headache F/up in 39month  BP Readings from Last 3 Encounters:  07/23/20 126/74  07/04/20 (!) 158/100  06/27/20 (!) 145/94   Reviewed past Medical, Social and Family history today.  Outpatient Medications Prior to Visit  Medication Sig Dispense Refill  . amLODipine (NORVASC) 10 MG tablet Take 1 tablet (10 mg total) by mouth daily. 90 tablet 3  . fluticasone (FLONASE) 50 MCG/ACT nasal spray SHAKE LIQUID AND USE 1 SPRAY IN EACH NOSTRIL DAILY 16 g 5  . loratadine (CLARITIN) 10 MG tablet Take 10 mg by mouth daily.    Marland Kitchen losartan (COZAAR) 25 MG tablet Take 1 tablet (25 mg total) by mouth daily. 90 tablet 3  . omeprazole (PRILOSEC) 20 MG capsule Take 1 capsule (20 mg total) by mouth daily. 90 capsule 1  . triamcinolone ointment (KENALOG) 0.5 % Apply 1 application topically 2 (two) times daily. You may use for 2 weeks at a time as needed. 30 g 0  . Vitamin D, Ergocalciferol, 2000 units CAPS Take 1 capsule by mouth daily after breakfast. 90 capsule 1  . SUMAtriptan (IMITREX) 25 MG tablet Take 1 tablet (25 mg total) by mouth every 2 (two) hours as needed for migraine. Do not use more than 100mg  in 24hrs. Need office visit for additional refills 10 tablet 1   No facility-administered medications prior to visit.    ROS See HPI  Objective:  BP 126/74 (BP Location: Left Arm, Patient  Position: Sitting, Cuff Size: Normal)   Pulse 79   Temp (!) 97.5 F (36.4 C) (Temporal)   Ht 5\' 6"  (1.676 m)   Wt 183 lb (83 kg)   LMP 01/18/2012 (Exact Date)   SpO2 96%   BMI 29.54 kg/m   Physical Exam Vitals reviewed.  Cardiovascular:     Rate and Rhythm: Normal rate.     Pulses: Normal pulses.  Pulmonary:     Effort: Pulmonary effort is normal.  Musculoskeletal:     Right lower leg: No edema.     Left lower leg: No edema.  Neurological:     Mental Status: She is alert and oriented to person, place, and time.     Assessment & Plan:  This visit occurred during the SARS-CoV-2 public health emergency.  Safety protocols were in place, including screening questions prior to the visit, additional usage of staff PPE, and extensive cleaning of exam room while observing appropriate contact time as indicated for disinfecting solutions.   Abigail [name] was seen today for follow-up.  Diagnoses and all orders for this visit:  Primary hypertension  Migraine without aura and without status migrainosus, not intractable   Problem List Items Addressed This Visit      Cardiovascular and Mediastinum   HTN (hypertension) - Primary    Improved with current dose of amlodipine and losartan. Home BP readings: 132/82 to 148/92 Resolved severe headache  Maintain current  medication and DASH diet Check BP 2-3x/week. F/up in 41month      Migraine without aura and without status migrainosus, not intractable    Improved with BP control Advised to hold imitrex Use tylenol for mild headache and tramadol for severe headache F/up in 20month         Follow-up: Return in about 4 weeks (around 08/20/2020) for HTN.  Wilfred Lacy, NP

## 2020-07-23 NOTE — Assessment & Plan Note (Signed)
Improved with current dose of amlodipine and losartan. Home BP readings: 132/82 to 148/92 Resolved severe headache  Maintain current medication and DASH diet Check BP 2-3x/week. F/up in 104month

## 2020-07-26 ENCOUNTER — Other Ambulatory Visit (HOSPITAL_COMMUNITY)
Admission: RE | Admit: 2020-07-26 | Discharge: 2020-07-26 | Disposition: A | Discharge status: Home / Self Care | Payer: 59 | Source: Ambulatory Visit | Attending: Obstetrics and Gynecology | Admitting: Obstetrics and Gynecology

## 2020-07-26 ENCOUNTER — Encounter: Payer: Self-pay | Admitting: Obstetrics and Gynecology

## 2020-07-26 ENCOUNTER — Other Ambulatory Visit: Payer: Self-pay

## 2020-07-26 ENCOUNTER — Ambulatory Visit (INDEPENDENT_AMBULATORY_CARE_PROVIDER_SITE_OTHER): Payer: 59 | Admitting: Obstetrics and Gynecology

## 2020-07-26 VITALS — BP 130/88 | HR 88 | Ht 64.25 in | Wt 180.0 lb

## 2020-07-26 DIAGNOSIS — Z01419 Encounter for gynecological examination (general) (routine) without abnormal findings: Secondary | ICD-10-CM | POA: Diagnosis present

## 2020-07-26 DIAGNOSIS — R8290 Unspecified abnormal findings in urine: Secondary | ICD-10-CM | POA: Diagnosis not present

## 2020-07-26 DIAGNOSIS — N898 Other specified noninflammatory disorders of vagina: Secondary | ICD-10-CM | POA: Diagnosis not present

## 2020-07-26 DIAGNOSIS — Z803 Family history of malignant neoplasm of breast: Secondary | ICD-10-CM

## 2020-07-26 DIAGNOSIS — N951 Menopausal and female climacteric states: Secondary | ICD-10-CM

## 2020-07-26 DIAGNOSIS — Z1211 Encounter for screening for malignant neoplasm of colon: Secondary | ICD-10-CM

## 2020-07-26 LAB — WET PREP FOR TRICH, YEAST, CLUE

## 2020-07-26 NOTE — Progress Notes (Signed)
50 y.o. G65P2012 Married [place] American female here for annual exam. Patient complains of vaginal irritation and "strong" odor in urine. Having vaginal itching.   Had Covid in January.  Had hot flashes following this.  These have resolved.  Vaginal dryness.  PCP: Flossie Buffy, NP    Patient's last menstrual period was 01/18/2012 (exact date).           Sexually active: Yes.    The current method of family planning is status post hysterectomy.    Exercising: Yes.    gym Smoker:  no  Health Maintenance: Pap:  07/28/16 Neg:Neg HR HPV History of abnormal Pap:  yes MMG:  2021 MM/Left Axilla [place] - BIRADS 3:Probably Benign.  Due for routine screening in April, 2022.  Colonoscopy:  never BMD:   n/a  Result  n/a TDaP:  04/10/14 Gardasil:   n/a HIV and Hep C: 03/31/19 Neg Screening Labs: PCP    reports that she has never smoked. She has never used smokeless tobacco. She reports current alcohol use of about 1.0 standard drink of alcohol per week. She reports that she does not use drugs.  Past Medical History:  Diagnosis Date  . Anemia    iron supp  . Anxiety   . COVID 05/2020  . Depression   . Fibroids   . GERD (gastroesophageal reflux disease)    omeprazole  . Headache(784.0)   . Hypertension 2012   Maxide  . S/P LEEP 1992  . Urticaria     Past Surgical History:  Procedure Laterality Date  . Inicision and drainage of thrombosed hemorrhoid  04/06/2018  . LEEP  1998   Abnormal pap  . SUPRACERVICAL ABDOMINAL HYSTERECTOMY  01/18/2012   Procedure: HYSTERECTOMY SUPRACERVICAL ABDOMINAL with LSO;  Surgeon: Osborne Oman, MD;  Location: [facility];  Service: Gynecology;  Laterality: N/A;  . TUBAL LIGATION  1997  . WISDOM TOOTH EXTRACTION      Current Outpatient Medications  Medication Sig Dispense Refill  . amLODipine (NORVASC) 10 MG tablet Take 1 tablet (10 mg total) by mouth daily. 90 tablet 3  . fluticasone (FLONASE) 50 MCG/ACT nasal spray SHAKE LIQUID AND USE 1 SPRAY  IN EACH NOSTRIL DAILY 16 g 5  . loratadine (CLARITIN) 10 MG tablet Take 10 mg by mouth daily.    Marland Kitchen losartan (COZAAR) 25 MG tablet Take 1 tablet (25 mg total) by mouth daily. 90 tablet 3  . omeprazole (PRILOSEC) 20 MG capsule Take 1 capsule (20 mg total) by mouth daily. 90 capsule 1  . triamcinolone ointment (KENALOG) 0.5 % Apply 1 application topically 2 (two) times daily. You may use for 2 weeks at a time as needed. 30 g 0  . Vitamin D, Ergocalciferol, 2000 units CAPS Take 1 capsule by mouth daily after breakfast. 90 capsule 1   No current facility-administered medications for this visit.    Family History  Problem Relation Age of Onset  . Hypertension Father   . Depression Father   . Diabetes Father   . Alcohol abuse Father   . Colonic polyp Father   . Diabetes Sister   . Diabetes Brother   . Hypertension Mother   . Arthritis Mother   . Pulmonary fibrosis Mother   . Hypertension Sister   . Cancer Paternal Grandmother        breast  . Heart attack Paternal Grandmother   . Cancer Daughter        breast  . Allergic rhinitis Neg Hx   .  Angioedema Neg Hx   . Asthma Neg Hx   . Eczema Neg Hx   . Immunodeficiency Neg Hx   . Urticaria Neg Hx     Review of Systems  Constitutional: Negative.   HENT: Negative.   Eyes: Negative.   Respiratory: Negative.   Cardiovascular: Negative.   Gastrointestinal: Negative.   Endocrine: Negative.   Genitourinary:       Vaginal irritation Odor in urine  Musculoskeletal: Negative.   Skin: Negative.   Allergic/Immunologic: Negative.   Neurological: Negative.   Hematological: Negative.   Psychiatric/Behavioral: Negative.     Exam:   BP 130/88   Pulse 88   Ht 5' 4.25" (1.632 m)   Wt 180 lb (81.6 kg)   LMP 01/18/2012 (Exact Date)   SpO2 99%   BMI 30.66 kg/m     General appearance: alert, cooperative and appears stated age Head: normocephalic, without obvious abnormality, atraumatic Neck: no adenopathy, supple, symmetrical, trachea  midline and thyroid normal to inspection and palpation Lungs: clear to auscultation bilaterally Breasts: normal appearance, no masses or tenderness, No nipple retraction or dimpling, No nipple discharge or bleeding, No axillary adenopathy Heart: regular rate and rhythm Abdomen: soft, non-tender; no masses, no organomegaly Extremities: extremities normal, atraumatic, no cyanosis or edema Skin: skin color, texture, turgor normal. No rashes or lesions Lymph nodes: cervical, supraclavicular, and axillary nodes normal. Neurologic: grossly normal  Pelvic: External genitalia:  no lesions              No abnormal inguinal nodes palpated.              Urethra:  normal appearing urethra with no masses, tenderness or lesions              Bartholins and Skenes: normal                 Vagina: normal appearing vagina with normal color and discharge, no lesions              Cervix: no lesions              Pap taken: Yes.   Bimanual Exam:  Uterus:  absent              Adnexa: no mass, fullness, tenderness              Rectal exam: Yes.  .  Confirms.              Anus:  normal sphincter tone, no lesions  Chaperone was present for exam.  Assessment:   Well woman visit with normal exam. Status post supracervical hysterectomy, LSO, right salpingectomy for fibroids. Right ovary remains. Hx LEEP 1998. Vaginal irritation.  Urinary odor. Vaginal atrophy. HTN.  Menopausal symptoms.  Daughter with breast cancer, age 56 yo.    Plan: Mammogram screening discussed. Self breast awareness reviewed. Pap and HR HPV as above. Guidelines for Calcium, Vitamin D, regular exercise program including cardiovascular and weight bearing exercise. Referral for colonoscopy.  Referral for genetic counseling and testing.  Wet prep:  Negative for yeast, clue cells, and trichomonas. Urinalysis and reflex culture:  sg 1.025, neg nitrites, 0 - 5 WBC, NS RBC, 6 - 10 squmas, few bacteria.   Check FSH and estradiol. We  discussed water based lubricants, other cooking oils, and local vaginal estrogen for vaginal atrophy.   I discussed potential effect of vaginal estrogen on breast cancer.   Follow up annually and prn.

## 2020-07-26 NOTE — Patient Instructions (Signed)

## 2020-07-27 LAB — FOLLICLE STIMULATING HORMONE: FSH: 15.8 m[IU]/mL

## 2020-07-27 LAB — ESTRADIOL: Estradiol: 204 pg/mL

## 2020-07-28 LAB — URINALYSIS, COMPLETE W/RFL CULTURE
Bilirubin Urine: NEGATIVE
Glucose, UA: NEGATIVE
Hgb urine dipstick: NEGATIVE
Hyaline Cast: NONE SEEN /LPF
Ketones, ur: NEGATIVE
Leukocyte Esterase: NEGATIVE
Nitrites, Initial: NEGATIVE
RBC / HPF: NONE SEEN /HPF (ref 0–2)
Specific Gravity, Urine: 1.025 (ref 1.001–1.03)
pH: 6 (ref 5.0–8.0)

## 2020-07-28 LAB — URINE CULTURE
MICRO NUMBER:: 11664986
Result:: NO GROWTH
SPECIMEN QUALITY:: ADEQUATE

## 2020-07-28 LAB — CULTURE INDICATED

## 2020-07-29 LAB — CYTOLOGY - PAP
Comment: NEGATIVE
Diagnosis: NEGATIVE
High risk HPV: NEGATIVE

## 2020-07-30 ENCOUNTER — Encounter: Payer: Self-pay | Admitting: Obstetrics and Gynecology

## 2020-07-31 ENCOUNTER — Telehealth: Payer: Self-pay | Admitting: Genetic Counselor

## 2020-07-31 NOTE — Telephone Encounter (Signed)
Received a genetic counseling referral from Dr. Quincy Simmonds for fhx of breast cancer. Abigail [name] has been cld and scheduled to see Abigail [name] on 4/21 at 3pm.

## 2020-08-07 ENCOUNTER — Other Ambulatory Visit: Payer: 59

## 2020-08-13 ENCOUNTER — Other Ambulatory Visit: Payer: Self-pay

## 2020-08-13 ENCOUNTER — Other Ambulatory Visit: Payer: 59

## 2020-08-13 DIAGNOSIS — R7989 Other specified abnormal findings of blood chemistry: Secondary | ICD-10-CM

## 2020-08-14 LAB — THYROID PANEL WITH TSH
Free Thyroxine Index: 2.3 (ref 1.4–3.8)
T3 Uptake: 30 % (ref 22–35)
T4, Total: 7.8 ug/dL (ref 5.1–11.9)
TSH: 0.47 mIU/L

## 2020-08-27 ENCOUNTER — Other Ambulatory Visit: Payer: Self-pay

## 2020-08-27 ENCOUNTER — Telehealth: Payer: Self-pay | Admitting: Genetic Counselor

## 2020-08-27 ENCOUNTER — Encounter: Payer: Self-pay | Admitting: Nurse Practitioner

## 2020-08-27 ENCOUNTER — Ambulatory Visit: Payer: 59 | Admitting: Nurse Practitioner

## 2020-08-27 VITALS — BP 114/78 | HR 82 | Temp 96.9°F | Wt 182.0 lb

## 2020-08-27 DIAGNOSIS — G43009 Migraine without aura, not intractable, without status migrainosus: Secondary | ICD-10-CM

## 2020-08-27 DIAGNOSIS — L500 Allergic urticaria: Secondary | ICD-10-CM | POA: Insufficient documentation

## 2020-08-27 DIAGNOSIS — I10 Essential (primary) hypertension: Secondary | ICD-10-CM

## 2020-08-27 MED ORDER — EPINEPHRINE 0.3 MG/0.3ML IJ SOAJ: 0.3000 mg | INTRAMUSCULAR | 1 refills | Status: DC | PRN

## 2020-08-27 MED ORDER — SUMATRIPTAN SUCCINATE 25 MG PO TABS: 25.0000 mg | ORAL_TABLET | ORAL | 0 refills | Status: DC | PRN

## 2020-08-27 NOTE — Patient Instructions (Addendum)
Let me know where I need to send Allergy referral. Start zyrtec 10mg  daily  Maintain other medications as prescribed.

## 2020-08-27 NOTE — Assessment & Plan Note (Signed)
Develop facial rash and itching after eating cupcake at work. She does not remember flavor of cake. Symptoms improved with benadryl OTC. She also has known seafood allergy. Provided EpiPen rx and instruction on when to use. Reviewed possible side effects. Advised to start zyrtec 10mg  daily. She choose to find an allergist within her insurance network and outside of [place].

## 2020-08-27 NOTE — Assessment & Plan Note (Addendum)
Ok to resume use of imitrex prn with BP at goal

## 2020-08-27 NOTE — Progress Notes (Signed)
Subjective:  Patient ID: Abigail [name], female    DOB: 1970/10/28  Age: 50 y.o. MRN: [id number]  CC: Follow-up (4 week f/u on HTN. Pt has been checking BP at home and it has been ranging 110-126/80-90. )  HPI  Migraine without aura and without status migrainosus, not intractable Ok to resume use of imitrex prn with BP at goal  HTN (hypertension) BP at goal with amlodipine and amlodipine. BP Readings from Last 3 Encounters:  08/27/20 114/78  07/26/20 130/88  07/23/20 126/74   Continue current dose  Urticaria due to food allergy Develop facial rash and itching after eating cupcake at work. She does not remember flavor of cake. Symptoms improved with benadryl OTC. She also has known seafood allergy. Provided EpiPen rx and instruction on when to use. Reviewed possible side effects. Advised to start zyrtec 10mg  daily. She choose to find an allergist within her insurance network and outside of [place] and [facility].  BP Readings from Last 3 Encounters:  08/27/20 114/78  07/26/20 130/88  07/23/20 126/74   Wt Readings from Last 3 Encounters:  08/27/20 182 lb (82.6 kg)  07/26/20 180 lb (81.6 kg)  07/23/20 183 lb (83 kg)   Reviewed past Medical, Social and Family history today.  Outpatient Medications Prior to Visit  Medication Sig Dispense Refill  . amLODipine (NORVASC) 10 MG tablet Take 1 tablet (10 mg total) by mouth daily. 90 tablet 3  . fluticasone (FLONASE) 50 MCG/ACT nasal spray SHAKE LIQUID AND USE 1 SPRAY IN EACH NOSTRIL DAILY 16 g 5  . loratadine (CLARITIN) 10 MG tablet Take 10 mg by mouth daily.    Marland Kitchen losartan (COZAAR) 25 MG tablet Take 1 tablet (25 mg total) by mouth daily. 90 tablet 3  . omeprazole (PRILOSEC) 20 MG capsule Take 1 capsule (20 mg total) by mouth daily. 90 capsule 1  . triamcinolone ointment (KENALOG) 0.5 % Apply 1 application topically 2 (two) times daily. You may use for 2 weeks at a time as needed. 30 g 0  . Vitamin D, Ergocalciferol, 2000  units CAPS Take 1 capsule by mouth daily after breakfast. 90 capsule 1   No facility-administered medications prior to visit.    ROS See HPI  Objective:  BP 114/78 (BP Location: Left Arm, Patient Position: Sitting, Cuff Size: Normal)   Pulse 82   Temp (!) 96.9 F (36.1 C) (Temporal)   Wt 182 lb (82.6 kg)   LMP 01/18/2012 (Exact Date)   SpO2 99%   BMI 31.00 kg/m   Physical Exam Vitals reviewed.  Constitutional:      Appearance: She is obese.  Cardiovascular:     Rate and Rhythm: Normal rate and regular rhythm.     Pulses: Normal pulses.     Heart sounds: Normal heart sounds.  Pulmonary:     Effort: Pulmonary effort is normal.     Breath sounds: Normal breath sounds.  Skin:    Findings: No rash.  Neurological:     Mental Status: She is alert and oriented to person, place, and time.    Assessment & Plan:  This visit occurred during the SARS-CoV-2 public health emergency.  Safety protocols were in place, including screening questions prior to the visit, additional usage of staff PPE, and extensive cleaning of exam room while observing appropriate contact time as indicated for disinfecting solutions.   Abigail [name] was seen today for follow-up.  Diagnoses and all orders for this visit:  Primary hypertension  Urticaria due to  food allergy -     EPINEPHrine 0.3 mg/0.3 mL IJ SOAJ injection; Inject 0.3 mg into the muscle as needed for anaphylaxis.  Migraine without aura and without status migrainosus, not intractable -     SUMAtriptan (IMITREX) 25 MG tablet; Take 1-2 tablets (25-50 mg total) by mouth every 2 (two) hours as needed for migraine. May repeat in 2 hours if headache persists or recurs. No more than 100mg  in 24hrs   Problem List Items Addressed This Visit      Cardiovascular and Mediastinum   HTN (hypertension) - Primary    BP at goal with amlodipine and amlodipine. BP Readings from Last 3 Encounters:  08/27/20 114/78  07/26/20 130/88  07/23/20 126/74    Continue current dose      Relevant Medications   EPINEPHrine 0.3 mg/0.3 mL IJ SOAJ injection   Migraine without aura and without status migrainosus, not intractable    Ok to resume use of imitrex prn with BP at goal      Relevant Medications   EPINEPHrine 0.3 mg/0.3 mL IJ SOAJ injection   SUMAtriptan (IMITREX) 25 MG tablet     Musculoskeletal and Integument   Urticaria due to food allergy    Develop facial rash and itching after eating cupcake at work. She does not remember flavor of cake. Symptoms improved with benadryl OTC. She also has known seafood allergy. Provided EpiPen rx and instruction on when to use. Reviewed possible side effects. Advised to start zyrtec 10mg  daily. She choose to find an allergist within her insurance network and outside of [place] and [facility].      Relevant Medications   EPINEPHrine 0.3 mg/0.3 mL IJ SOAJ injection      Follow-up: Return in about 3 months (around 11/26/2020) for CPE (fasting).  Wilfred Lacy, NP

## 2020-08-27 NOTE — Telephone Encounter (Signed)
Cancelled appts per 4/19 sch msg. Pt declined to r/s at this time.

## 2020-08-27 NOTE — Assessment & Plan Note (Signed)
BP at goal with amlodipine and amlodipine. BP Readings from Last 3 Encounters:  08/27/20 114/78  07/26/20 130/88  07/23/20 126/74   Continue current dose

## 2020-08-29 ENCOUNTER — Inpatient Hospital Stay: Payer: 59 | Admitting: Genetic Counselor

## 2020-08-29 ENCOUNTER — Inpatient Hospital Stay: Payer: 59

## 2020-09-24 ENCOUNTER — Other Ambulatory Visit: Payer: Self-pay | Admitting: Nurse Practitioner

## 2020-09-24 DIAGNOSIS — Z1231 Encounter for screening mammogram for malignant neoplasm of breast: Secondary | ICD-10-CM

## 2020-10-31 ENCOUNTER — Encounter: Payer: Self-pay | Admitting: Nurse Practitioner

## 2020-11-19 ENCOUNTER — Telehealth: Payer: Self-pay | Admitting: Physical Medicine and Rehabilitation

## 2020-11-19 NOTE — Telephone Encounter (Signed)
Pt calling wanting to get an appt with Dr. Ernestina Patches for another inj. The best call back number is [phone].

## 2020-11-28 ENCOUNTER — Other Ambulatory Visit: Payer: Self-pay

## 2020-11-28 ENCOUNTER — Encounter: Payer: Self-pay | Admitting: Nurse Practitioner

## 2020-11-28 ENCOUNTER — Ambulatory Visit: Payer: 59 | Admitting: Nurse Practitioner

## 2020-11-28 ENCOUNTER — Ambulatory Visit
Admission: RE | Admit: 2020-11-28 | Discharge: 2020-11-28 | Disposition: A | Discharge status: Home / Self Care | Payer: 59 | Source: Ambulatory Visit | Attending: Nurse Practitioner | Admitting: Nurse Practitioner

## 2020-11-28 VITALS — BP 120/84 | HR 72 | Ht 64.75 in | Wt 182.5 lb

## 2020-11-28 DIAGNOSIS — R140 Abdominal distension (gaseous): Secondary | ICD-10-CM | POA: Diagnosis not present

## 2020-11-28 DIAGNOSIS — Z1231 Encounter for screening mammogram for malignant neoplasm of breast: Secondary | ICD-10-CM

## 2020-11-28 DIAGNOSIS — Z1211 Encounter for screening for malignant neoplasm of colon: Secondary | ICD-10-CM

## 2020-11-28 DIAGNOSIS — K5904 Chronic idiopathic constipation: Secondary | ICD-10-CM

## 2020-11-28 DIAGNOSIS — K219 Gastro-esophageal reflux disease without esophagitis: Secondary | ICD-10-CM

## 2020-11-28 MED ORDER — LUBIPROSTONE 8 MCG PO CAPS: 8.0000 ug | ORAL_CAPSULE | Freq: Two times a day (BID) | ORAL | 2 refills | Status: DC

## 2020-11-28 MED ORDER — PLENVU 140 G PO SOLR: ORAL | 0 refills | Status: DC

## 2020-11-28 NOTE — Progress Notes (Signed)
ASSESSMENT AND PLAN    # 50 yo female referred for colon cancer screening.  --The risks and benefits of colonoscopy with possible polypectomy / biopsies were discussed and the patient agrees to proceed.   # Chronic constipation characterized by decreased frequency of urge. She has previously taken fiber bars, Miralax, and stool softeners. Linzess helped but was cost prohibitive --Will try Amitiza 61mcg BID ( if affordable for her) --If she cannot get Amitiza then may need to retry Miralax which she took for a brief period several years ago. .   # Chronic GER manifested as heartburn. Takes Omeprazole as needed ( about every three days).  --Anti-reflux measures discussed and literature given --Continue Omeprazole 20 mg 30 minutes prior to breakfast ( or other meal as she works 3rd shift)  # Chronic bloating, typically starts after first meal of the day. Potential causes discussed (food intolerances, SIBO, constipation, etc).  --If no improvement in bloating after resolution of constipation then can consider testing for SIBO (or treating empirically).  --In the interim she will keep a food diary to try to try and identify any culprit foods.  --Avoid artifical sweeteners.   # History of H.pylori IgG serology in 2014. She completed only part of the prescribed treatment.   --check H.pylori stool ag off PPI for two weeks. After our clinic visit today I called patient to confirm that she would hold PPI prior to testing.     HISTORY OF PRESENT ILLNESS     Chief Complaint : Constipation, bloating  Abigail [name] is a 50 y.o. female with a past medical history significant for GERD, hypertension, headaches, hysterectomy. See PMH below for any additional history.   Patient referred by Dr. Gala Romney ( GYN) for colon cancer screening. No [place] of colon cancer, dad had colon polyps. Patient hasn't seen any blood in her stool.   Patient gives a history of chronic constipation. She has tried  Colace which helped but she had to take several of them for results.  Years ago she took MiraLAX for a couple of weeks but it caused an increase in intestinal gas . Fiber bars were helpful but she didn't like the taste. She took [place] which helped but it was too costly. She doesn't drink much water as it makes her urinate and that is problematic as she works at the [employer]. She averages about 16 oz of water a day. She eats fruit but doesn't feel like her fiber intake is adequate.   Patient gives a several year history of GER with pyrosis. She can only go about three days without Omeprazole before developing heartburn. No dysphagia.   Patient describes chronic bloating generally starting after first meal of day. She looks pregnant at times. She cannot relate the bloating to any certain foods. She doesn't drink many carbonated beverages.  She wonders if constipation could be contributing to the bloating.    Past Medical History:  Diagnosis Date   Anemia    iron supp   Anxiety    COVID 05/2020   Depression    Fibroids    GERD (gastroesophageal reflux disease)    omeprazole   Headache(784.0)    Hypertension 2012   Maxide   S/P LEEP 1992   Urticaria      Past Surgical History:  Procedure Laterality Date   Inicision and drainage of thrombosed hemorrhoid  04/06/2018   LEEP  1998   Abnormal pap   SUPRACERVICAL ABDOMINAL HYSTERECTOMY  01/18/2012  Procedure: HYSTERECTOMY SUPRACERVICAL ABDOMINAL with LSO;  Surgeon: Osborne Oman, MD;  Location: [facility];  Service: Gynecology;  Laterality: N/A;   TUBAL LIGATION  1997   WISDOM TOOTH EXTRACTION     Family History  Problem Relation Age of Onset   Hypertension Mother    Arthritis Mother    Pulmonary fibrosis Mother    Hypertension Father    Depression Father    Diabetes Father    Alcohol abuse Father    Colonic polyp Father    Diabetes Sister    Congestive Heart Failure Sister    Hypertension Sister    Diabetes Brother    Breast  cancer Paternal Grandmother    Heart attack Paternal Grandmother    Breast cancer Daughter    Allergic rhinitis Neg Hx    Angioedema Neg Hx    Asthma Neg Hx    Eczema Neg Hx    Immunodeficiency Neg Hx    Urticaria Neg Hx    Social History   Tobacco Use   Smoking status: Never   Smokeless tobacco: Never  Vaping Use   Vaping Use: Never used  Substance Use Topics   Alcohol use: Yes    Alcohol/week: 1.0 standard drink    Types: 1 Standard drinks or equivalent per week    Comment: occasional, 23 weekly   Drug use: No   Current Outpatient Medications  Medication Sig Dispense Refill   amLODipine (NORVASC) 10 MG tablet Take 1 tablet (10 mg total) by mouth daily. 90 tablet 3   diphenhydrAMINE (BENADRYL) 25 MG tablet Take 25 mg by mouth as needed.     fluticasone (FLONASE) 50 MCG/ACT nasal spray SHAKE LIQUID AND USE 1 SPRAY IN EACH NOSTRIL DAILY 16 g 5   loratadine (CLARITIN) 10 MG tablet Take 10 mg by mouth daily.     losartan (COZAAR) 25 MG tablet Take 1 tablet (25 mg total) by mouth daily. 90 tablet 3   omeprazole (PRILOSEC) 20 MG capsule Take 1 capsule (20 mg total) by mouth daily. 90 capsule 1   triamcinolone ointment (KENALOG) 0.5 % Apply 1 application topically 2 (two) times daily. You may use for 2 weeks at a time as needed. 30 g 0   Vitamin D, Ergocalciferol, 2000 units CAPS Take 1 capsule by mouth daily after breakfast. 90 capsule 1   SUMAtriptan (IMITREX) 25 MG tablet Take 1-2 tablets (25-50 mg total) by mouth every 2 (two) hours as needed for migraine. May repeat in 2 hours if headache persists or recurs. No more than 100mg  in 24hrs (Patient not taking: Reported on 11/28/2020) 10 tablet 0   No current facility-administered medications for this visit.   Allergies  Allergen Reactions   Shellfish Allergy Itching and Swelling    Swollen tongue, itching   Tylox [Oxycodone-Acetaminophen] Itching     Review of Systems: Positive for allergy, sinus trouble, back pain.  All  other systems reviewed and negative except where noted in HPI.    PHYSICAL EXAM :    Wt Readings from Last 3 Encounters:  11/28/20 182 lb 8 oz (82.8 kg)  08/27/20 182 lb (82.6 kg)  07/26/20 180 lb (81.6 kg)    BP 120/84 (BP Location: Left Arm, Patient Position: Sitting, Cuff Size: Normal)   Pulse 72   Ht 5' 4.75" (1.645 m) Comment: height measured without shoes  Wt 182 lb 8 oz (82.8 kg)   LMP 01/18/2012 (Exact Date)   BMI 30.60 kg/m  Constitutional:  Pleasant female in  no acute distress. Psychiatric: Normal mood and affect. Behavior is normal. EENT: Pupils normal.  Conjunctivae are normal. No scleral icterus. Neck supple.  Cardiovascular: Normal rate, regular rhythm. No edema Pulmonary/chest: Effort normal and breath sounds normal. No wheezing, rales or rhonchi. Abdominal: Soft, nondistended, nontender. Bowel sounds active throughout. There are no masses palpable. No hepatomegaly. Neurological: Alert and oriented to person place and time. Skin: Skin is warm and dry. No rashes noted.  Tye Savoy, NP  11/28/2020, 2:27 PM  Cc:  Referring Provider Gala Romney, MD

## 2020-11-28 NOTE — Patient Instructions (Addendum)
If you are age 51 or younger, your body mass index should be between 19-25. Your Body mass index is 30.6 kg/m. If this is out of the aformentioned range listed, please consider follow up with your Primary Care Provider.   The [facility] providers would like to encourage you to use [facility] to communicate with providers for non-urgent requests or questions.  Due to long hold times on the telephone, sending your provider a message by [facility] may be faster and more efficient way to get a response. Please allow 48 business hours for a response.  Please remember that this is for non-urgent requests/questions.   Please stop by the lab to pick up a stool test kit to check for H.pylori infection.  MEDICATION: We have sent the following medication to your pharmacy for you to pick up at your convenience: lubiprostone (AMITIZA) 8 MCG capsule 60 capsule 2 11/28/2020    Sig - Route: Take 1 capsule (8 mcg total) by mouth 2 (two) times daily with a meal. - Oral    PROCEDURES: You have been scheduled for a colonoscopy. Please follow the written instructions given to you at your visit today. Please pick up your prep supplies at the pharmacy within the next 1-3 days. If you use inhalers (even only as needed), please bring them with you on the day of your procedure.      It was great seeing you today! Thank you for entrusting me with your care and choosing [facility].  Tye Savoy, NP

## 2020-12-02 ENCOUNTER — Ambulatory Visit: Payer: 59 | Admitting: Physical Medicine and Rehabilitation

## 2020-12-02 ENCOUNTER — Ambulatory Visit: Payer: Self-pay

## 2020-12-02 ENCOUNTER — Other Ambulatory Visit: Payer: Self-pay

## 2020-12-02 ENCOUNTER — Encounter: Payer: Self-pay | Admitting: Physical Medicine and Rehabilitation

## 2020-12-02 VITALS — BP 129/87 | HR 72

## 2020-12-02 DIAGNOSIS — M47816 Spondylosis without myelopathy or radiculopathy, lumbar region: Secondary | ICD-10-CM | POA: Diagnosis not present

## 2020-12-02 MED ORDER — METHYLPREDNISOLONE ACETATE 80 MG/ML IJ SUSP
80.0000 mg | Freq: Once | INTRAMUSCULAR | Status: AC
  Administered 2020-12-02: 80 mg

## 2020-12-02 NOTE — Progress Notes (Signed)
Pt state lower back pain that travels down to her the posterior of her right thigh. Pt state laying down on her side makes the pain worse. Pt state she uses a heating pads and over the counter pain meds help ease her pain.  Pt has hx of inj on 06/27/20 pt state it helped.  Numeric Pain Rating Scale and Functional Assessment Average Pain 5   In the last MONTH (on 0-10 scale) has pain interfered with the following?  1. General activity like being  able to carry out your everyday physical activities such as walking, climbing stairs, carrying groceries, or moving a chair?  Rating(9)   +Driver, -BT, -Dye Allergies.

## 2020-12-02 NOTE — Patient Instructions (Signed)

## 2020-12-02 NOTE — Progress Notes (Signed)
____________________________________________________________  Attending physician addendum:  Thank you for sending this case to me. I have reviewed the entire note and agree with the plan.   Sheyenne Konz Danis, MD  ____________________________________________________________  

## 2020-12-02 NOTE — Progress Notes (Signed)
Abigail [name] - 50 y.o. female MRN [id number]  Date of birth: 07/21/1970  Office Visit Note: Visit Date: 12/02/2020 PCP: Flossie Buffy, NP Referred by: Flossie Buffy, NP  Subjective: Chief Complaint  Patient presents with   Lower Back - Pain   Right Thigh - Pain   HPI:  Abigail [name] is a 50 y.o. female who comes in today for planned repeat Right L4-L5 Lumbar facet/medial branch block with fluoroscopic guidance.  The patient has failed conservative care including home exercise, medications, time and activity modification.  This injection will be diagnostic and hopefully therapeutic.  Please see requesting physician notes for further details and justification.  Exam shows concordant low back pain with facet joint loading and extension. Patient received more than 80% pain relief from prior injection. This would be the second block in a diagnostic double block paradigm.     Referring:Dr. Eduard Roux   ROS Otherwise per HPI.  Assessment & Plan: Visit Diagnoses:    ICD-10-CM   1. Spondylosis without myelopathy or radiculopathy, lumbar region  M47.816 XR C-ARM NO REPORT    Facet Injection    methylPREDNISolone acetate (DEPO-MEDROL) injection 80 mg      Plan: No additional findings.   Meds & Orders:  Meds ordered this encounter  Medications   methylPREDNISolone acetate (DEPO-MEDROL) injection 80 mg    Orders Placed This Encounter  Procedures   Facet Injection   XR C-ARM NO REPORT    Follow-up: Return if symptoms worsen or fail to improve.   Procedures: No procedures performed      Clinical History: MRI LUMBAR SPINE WITHOUT CONTRAST   TECHNIQUE: Multiplanar, multisequence MR imaging of the lumbar spine was performed. No intravenous contrast was administered.   COMPARISON:  None.   FINDINGS: Segmentation:  Standard.   Alignment:  Normal.   Vertebrae:  No fracture, evidence of discitis, or bone lesion.   Conus medullaris and cauda equina: Conus  extends to the L1 level. Conus and cauda equina appear normal.   Paraspinal and other soft tissues: Negative.   Disc levels:   T11-12 and T12-L1 are imaged in the sagittal plane only. There is a very shallow left paracentral protrusion at T11-12 without stenosis. T12-L1 is negative.   L1-2: Mild facet degenerative change.  Otherwise negative.   L2-3: Mild-to-moderate facet degenerative change. Otherwise negative.   L3-4: Mild-to-moderate facet degenerative change otherwise negative.   L4-5: Moderate facet arthropathy, shallow disc bulge and ligamentum flavum thickening. Mild central canal stenosis. The foramina are open.   L5-S1: Mild facet degenerative change.  Otherwise negative.   IMPRESSION: Spondylosis is most notable at L4-5 where there is mild central canal stenosis due to a shallow disc bulge and ligamentum flavum thickening. No nerve root compression is identified. Moderate facet arthropathy at this level noted.     Electronically Signed   By: Inge Rise M.D.   On: 05/25/2020 15:25     Objective:  VS:  HT:    WT:   BMI:     BP:129/87  HR:72bpm  TEMP: ( )  RESP:  Physical Exam Vitals and nursing note reviewed.  Constitutional:      General: She is not in acute distress.    Appearance: Normal appearance. She is not ill-appearing.  HENT:     Head: Normocephalic and atraumatic.     Right Ear: External ear normal.     Left Ear: External ear normal.  Eyes:     Extraocular Movements: Extraocular movements  intact.  Cardiovascular:     Rate and Rhythm: Normal rate.     Pulses: Normal pulses.  Pulmonary:     Effort: Pulmonary effort is normal. No respiratory distress.  Abdominal:     General: There is no distension.     Palpations: Abdomen is soft.  Musculoskeletal:        General: Tenderness present.     Cervical back: Neck supple.     Right lower leg: No edema.     Left lower leg: No edema.     Comments: Patient has good distal strength with  no pain over the greater trochanters.  No clonus or focal weakness. Patient somewhat slow to rise from a seated position to full extension.  There is concordant low back pain with facet loading and lumbar spine extension rotation.  There are no definitive trigger points but the patient is somewhat tender across the lower back and PSIS.  There is no pain with hip rotation.   Skin:    Findings: No erythema, lesion or rash.  Neurological:     General: No focal deficit present.     Mental Status: She is alert and oriented to person, place, and time.     Sensory: No sensory deficit.     Motor: No weakness or abnormal muscle tone.     Coordination: Coordination normal.  Psychiatric:        Mood and Affect: Mood normal.        Behavior: Behavior normal.     Imaging: No results found.

## 2020-12-03 ENCOUNTER — Telehealth: Payer: Self-pay | Admitting: Physical Medicine and Rehabilitation

## 2020-12-03 NOTE — Telephone Encounter (Signed)
Pt called to let you know she sent the pain diary to your email.   CB [phone]

## 2020-12-04 NOTE — Progress Notes (Signed)
Abigail [name] - 50 y.o. female MRN [id number]  Date of birth: 29-Dec-1970  Office Visit Note: Visit Date: 12/02/2020 PCP: Flossie Buffy, NP Referred by: Flossie Buffy, NP  Subjective: Chief Complaint  Patient presents with   Lower Back - Pain   Right Thigh - Pain   HPI:  Abigail [name] is a 50 y.o. female who comes in today for planned repeat Right L4-L5 Lumbar facet/medial branch block with fluoroscopic guidance.  The patient has failed conservative care including home exercise, medications, time and activity modification.  This injection will be diagnostic and hopefully therapeutic.  Please see requesting physician notes for further details and justification.  Exam shows concordant low back pain with facet joint loading and extension. Patient received more than 80% pain relief from prior injection. This would be the second block in a diagnostic double block paradigm.     Referring:Dr. Eduard Roux   ROS Otherwise per HPI.  Assessment & Plan: Visit Diagnoses:    ICD-10-CM   1. Spondylosis without myelopathy or radiculopathy, lumbar region  M47.816 XR C-ARM NO REPORT    Facet Injection    methylPREDNISolone acetate (DEPO-MEDROL) injection 80 mg      Plan: No additional findings.   Meds & Orders:  Meds ordered this encounter  Medications   methylPREDNISolone acetate (DEPO-MEDROL) injection 80 mg    Orders Placed This Encounter  Procedures   Facet Injection   XR C-ARM NO REPORT    Follow-up: Return if symptoms worsen or fail to improve.   Procedures: No procedures performed  Lumbar Diagnostic Facet Joint Nerve Block with Fluoroscopic Guidance   Patient: Abigail [name]      Date of Birth: April 22, 1971 MRN: [id number] PCP: Flossie Buffy, NP      Visit Date: 12/02/2020   Universal Protocol:    Date/Time: 07/27/225:41 AM  Consent Given By: the patient  Position: PRONE  Additional Comments: Vital signs were monitored before and after the  procedure. Patient was prepped and draped in the usual sterile fashion. The correct patient, procedure, and site was verified.   Injection Procedure Details:   Procedure diagnoses:  1. Spondylosis without myelopathy or radiculopathy, lumbar region      Meds Administered:  Meds ordered this encounter  Medications   methylPREDNISolone acetate (DEPO-MEDROL) injection 80 mg     Laterality: Right  Location/Site: L4-L5, L3 and L4 medial branches and L5-S1, L4 medial branch and L5 dorsal ramus  Needle: 5.0 in., 25 ga.  Short bevel or Quincke spinal needle  Needle Placement: Oblique pedical  Findings:   -Comments: There was excellent flow of contrast along the articular pillars without intravascular flow.  Procedure Details: The fluoroscope beam is vertically oriented in AP and then obliqued 15 to 20 degrees to the ipsilateral side of the desired nerve to achieve the "Scotty dog" appearance.  The skin over the target area of the junction of the superior articulating process and the transverse process (sacral ala if blocking the L5 dorsal rami) was locally anesthetized with a 1 ml volume of 1% Lidocaine without Epinephrine.  The spinal needle was inserted and advanced in a trajectory view down to the target.   After contact with periosteum and negative aspirate for blood and CSF, correct placement without intravascular or epidural spread was confirmed by injecting 0.5 ml. of Isovue-250.  A spot radiograph was obtained of this image.    Next, a 0.5 ml. volume of the injectate described above was injected. The needle was then  redirected to the other facet joint nerves mentioned above if needed.  Prior to the procedure, the patient was given a Pain Diary which was completed for baseline measurements.  After the procedure, the patient rated their pain every 30 minutes and will continue rating at this frequency for a total of 5 hours.  The patient has been asked to complete the Diary and return  to [place] by mail, fax or hand delivered as soon as possible.   Additional Comments:  The patient tolerated the procedure well Dressing: 2 x 2 sterile gauze and Band-Aid    Post-procedure details: Patient was observed during the procedure. Post-procedure instructions were reviewed.  Patient left the clinic in stable condition.    Clinical History: MRI LUMBAR SPINE WITHOUT CONTRAST   TECHNIQUE: Multiplanar, multisequence MR imaging of the lumbar spine was performed. No intravenous contrast was administered.   COMPARISON:  None.   FINDINGS: Segmentation:  Standard.   Alignment:  Normal.   Vertebrae:  No fracture, evidence of discitis, or bone lesion.   Conus medullaris and cauda equina: Conus extends to the L1 level. Conus and cauda equina appear normal.   Paraspinal and other soft tissues: Negative.   Disc levels:   T11-12 and T12-L1 are imaged in the sagittal plane only. There is a very shallow left paracentral protrusion at T11-12 without stenosis. T12-L1 is negative.   L1-2: Mild facet degenerative change.  Otherwise negative.   L2-3: Mild-to-moderate facet degenerative change. Otherwise negative.   L3-4: Mild-to-moderate facet degenerative change otherwise negative.   L4-5: Moderate facet arthropathy, shallow disc bulge and ligamentum flavum thickening. Mild central canal stenosis. The foramina are open.   L5-S1: Mild facet degenerative change.  Otherwise negative.   IMPRESSION: Spondylosis is most notable at L4-5 where there is mild central canal stenosis due to a shallow disc bulge and ligamentum flavum thickening. No nerve root compression is identified. Moderate facet arthropathy at this level noted.     Electronically Signed   By: Inge Rise M.D.   On: 05/25/2020 15:25     Objective:  VS:  HT:    WT:   BMI:     BP:129/87  HR:72bpm  TEMP: ( )  RESP:  Physical Exam Vitals and nursing note reviewed.  Constitutional:      General: She  is not in acute distress.    Appearance: Normal appearance. She is not ill-appearing.  HENT:     Head: Normocephalic and atraumatic.     Right Ear: External ear normal.     Left Ear: External ear normal.  Eyes:     Extraocular Movements: Extraocular movements intact.  Cardiovascular:     Rate and Rhythm: Normal rate.     Pulses: Normal pulses.  Pulmonary:     Effort: Pulmonary effort is normal. No respiratory distress.  Abdominal:     General: There is no distension.     Palpations: Abdomen is soft.  Musculoskeletal:        General: Tenderness present.     Cervical back: Neck supple.     Right lower leg: No edema.     Left lower leg: No edema.     Comments: Patient has good distal strength with no pain over the greater trochanters.  No clonus or focal weakness. Patient somewhat slow to rise from a seated position to full extension.  There is concordant low back pain with facet loading and lumbar spine extension rotation.  There are no definitive trigger points but the  patient is somewhat tender across the lower back and PSIS.  There is no pain with hip rotation.   Skin:    Findings: No erythema, lesion or rash.  Neurological:     General: No focal deficit present.     Mental Status: She is alert and oriented to person, place, and time.     Sensory: No sensory deficit.     Motor: No weakness or abnormal muscle tone.     Coordination: Coordination normal.  Psychiatric:        Mood and Affect: Mood normal.        Behavior: Behavior normal.     Imaging: No results found.

## 2020-12-04 NOTE — Procedures (Signed)
Lumbar Diagnostic Facet Joint Nerve Block with Fluoroscopic Guidance   Patient: Abigail [name]      Date of Birth: March 29, 1971 MRN: [id number] PCP: Flossie Buffy, NP      Visit Date: 12/02/2020   Universal Protocol:    Date/Time: 07/27/225:41 AM  Consent Given By: the patient  Position: PRONE  Additional Comments: Vital signs were monitored before and after the procedure. Patient was prepped and draped in the usual sterile fashion. The correct patient, procedure, and site was verified.   Injection Procedure Details:   Procedure diagnoses:  1. Spondylosis without myelopathy or radiculopathy, lumbar region      Meds Administered:  Meds ordered this encounter  Medications   methylPREDNISolone acetate (DEPO-MEDROL) injection 80 mg     Laterality: Right  Location/Site: L4-L5, L3 and L4 medial branches and L5-S1, L4 medial branch and L5 dorsal ramus  Needle: 5.0 in., 25 ga.  Short bevel or Quincke spinal needle  Needle Placement: Oblique pedical  Findings:   -Comments: There was excellent flow of contrast along the articular pillars without intravascular flow.  Procedure Details: The fluoroscope beam is vertically oriented in AP and then obliqued 15 to 20 degrees to the ipsilateral side of the desired nerve to achieve the "Scotty dog" appearance.  The skin over the target area of the junction of the superior articulating process and the transverse process (sacral ala if blocking the L5 dorsal rami) was locally anesthetized with a 1 ml volume of 1% Lidocaine without Epinephrine.  The spinal needle was inserted and advanced in a trajectory view down to the target.   After contact with periosteum and negative aspirate for blood and CSF, correct placement without intravascular or epidural spread was confirmed by injecting 0.5 ml. of Isovue-250.  A spot radiograph was obtained of this image.    Next, a 0.5 ml. volume of the injectate described above was injected. The needle  was then redirected to the other facet joint nerves mentioned above if needed.  Prior to the procedure, the patient was given a Pain Diary which was completed for baseline measurements.  After the procedure, the patient rated their pain every 30 minutes and will continue rating at this frequency for a total of 5 hours.  The patient has been asked to complete the Diary and return to [place] by mail, fax or hand delivered as soon as possible.   Additional Comments:  The patient tolerated the procedure well Dressing: 2 x 2 sterile gauze and Band-Aid    Post-procedure details: Patient was observed during the procedure. Post-procedure instructions were reviewed.  Patient left the clinic in stable condition.

## 2020-12-17 ENCOUNTER — Other Ambulatory Visit: Payer: Self-pay | Admitting: Nurse Practitioner

## 2020-12-17 DIAGNOSIS — I10 Essential (primary) hypertension: Secondary | ICD-10-CM

## 2020-12-17 NOTE — Telephone Encounter (Signed)
Chart supports rx refill Last ov: 08/27/2020 Last refill: 08/19/2020

## 2020-12-20 ENCOUNTER — Ambulatory Visit: Payer: 59 | Admitting: Nurse Practitioner

## 2020-12-20 ENCOUNTER — Other Ambulatory Visit: Payer: Self-pay

## 2020-12-20 ENCOUNTER — Encounter: Payer: Self-pay | Admitting: Nurse Practitioner

## 2020-12-20 VITALS — BP 124/90 | HR 74 | Temp 97.2°F | Ht 65.0 in | Wt 181.0 lb

## 2020-12-20 DIAGNOSIS — I10 Essential (primary) hypertension: Secondary | ICD-10-CM

## 2020-12-20 DIAGNOSIS — F411 Generalized anxiety disorder: Secondary | ICD-10-CM | POA: Diagnosis not present

## 2020-12-20 MED ORDER — BUSPIRONE HCL 5 MG PO TABS: 5.0000 mg | ORAL_TABLET | Freq: Two times a day (BID) | ORAL | 5 refills | Status: DC

## 2020-12-20 MED ORDER — LOSARTAN POTASSIUM 25 MG PO TABS: 25.0000 mg | ORAL_TABLET | Freq: Two times a day (BID) | ORAL | 3 refills | Status: DC

## 2020-12-20 NOTE — Patient Instructions (Signed)
Start buspar for anxiety  Increase losartan to '25mg'$  BID Maintain amlodipine dose  Managing Anxiety, Adult After being diagnosed with an anxiety disorder, you may be relieved to know why you have felt or behaved a certain way. You may also feel overwhelmed about the treatment ahead and what it will mean for your life. With care and support, youcan manage this condition and recover from it. How to manage lifestyle changes Managing stress and anxiety  Stress is your body's reaction to life changes and events, both good and bad. Most stress will last just a few hours, but stress can be ongoing and can lead to more than just stress. Although stress can play a major role in anxiety, it is not the same as anxiety. Stress is usually caused by something external, such as a deadline, test, or competition. Stress normally passes after thetriggering event has ended.  Anxiety is caused by something internal, such as imagining a terrible outcome or worrying that something will go wrong that will devastate you. Anxiety often does not go away even after the triggering event is over, and it can become long-term (chronic) worry. It is important to understand the differences between stress and anxiety and to manage your stress effectively so that it does not lead to ananxious response. Talk with your health care provider or a counselor to learn more about reducing anxiety and stress. He or she may suggest tension reduction techniques, such as: Music therapy. This can include creating or listening to music that you enjoy and that inspires you. Mindfulness-based meditation. This involves being aware of your normal breaths while not trying to control your breathing. It can be done while sitting or walking. Centering prayer. This involves focusing on a word, phrase, or sacred image that means something to you and brings you peace. Deep breathing. To do this, expand your stomach and inhale slowly through your nose. Hold  your breath for 3-5 seconds. Then exhale slowly, letting your stomach muscles relax. Self-talk. This involves identifying thought patterns that lead to anxiety reactions and changing those patterns. Muscle relaxation. This involves tensing muscles and then relaxing them. Choose a tension reduction technique that suits your lifestyle and personality. These techniques take time and practice. Set aside 5-15 minutes a day to do them. Therapists can offer counseling and training in these techniques. The training to help with anxiety may be covered by some insurance plans. Other things you can do to manage stress and anxiety include: Keeping a stress/anxiety diary. This can help you learn what triggers your reaction and then learn ways to manage your response. Thinking about how you react to certain situations. You may not be able to control everything, but you can control your response. Making time for activities that help you relax and not feeling guilty about spending your time in this way. Visual imagery and yoga can help you stay calm and relax.  Medicines Medicines can help ease symptoms. Medicines for anxiety include: Anti-anxiety drugs. Antidepressants. Medicines are often used as a primary treatment for anxiety disorder. Medicines will be prescribed by a health care provider. When used together, medicines, psychotherapy, and tension reduction techniques may be the most effectivetreatment. Relationships Relationships can play a big part in helping you recover. Try to spend more time connecting with trusted friends and family members. Consider going to couples counseling, taking family education classes, or going to familytherapy. Therapy can help you and others better understand your condition. How to recognize changes in your anxiety Everyone responds  differently to treatment for anxiety. Recovery from anxiety happens when symptoms decrease and stop interfering with your daily activities at home  or work. This may mean that you will start to: Have better concentration and focus. Worry will interfere less in your daily thinking. Sleep better. Be less irritable. Have more energy. Have improved memory. It is important to recognize when your condition is getting worse. Contact your health care provider if your symptoms interfere with home or work and you feellike your condition is not improving. Follow these instructions at home: Activity Exercise. Most adults should do the following: Exercise for at least 150 minutes each week. The exercise should increase your heart rate and make you sweat (moderate-intensity exercise). Strengthening exercises at least twice a week. Get the right amount and quality of sleep. Most adults need 7-9 hours of sleep each night. Lifestyle  Eat a healthy diet that includes plenty of vegetables, fruits, whole grains, low-fat dairy products, and lean protein. Do not eat a lot of foods that are high in solid fats, added sugars, or salt. Make choices that simplify your life. Do not use any products that contain nicotine or tobacco, such as cigarettes, e-cigarettes, and chewing tobacco. If you need help quitting, ask your health care provider. Avoid caffeine, alcohol, and certain over-the-counter cold medicines. These may make you feel worse. Ask your pharmacist which medicines to avoid.  General instructions Take over-the-counter and prescription medicines only as told by your health care provider. Keep all follow-up visits as told by your health care provider. This is important. Where to find support You can get help and support from these sources: Self-help groups. Online and [employer]. A trusted spiritual leader. Couples counseling. Family education classes. Family therapy. Where to find more information You may find that joining a support group helps you deal with your anxiety. The following sources can help you locate counselors or  support groups near you: [place]: www.mentalhealth[place].net Anxiety and Depression Association of [place] (ADAA): https://www.clark.net/ National Alliance on Mental Illness (NAMI): www.nami.org Contact a health care provider if you: Have a hard time staying focused or finishing daily tasks. Spend many hours a day feeling worried about everyday life. Become exhausted by worry. Start to have headaches, feel tense, or have nausea. Urinate more than normal. Have diarrhea. Get help right away if you have: A racing heart and shortness of breath. Thoughts of hurting yourself or others. If you ever feel like you may hurt yourself or others, or have thoughts about taking your own life, get help right away. You can go to your nearest emergency department or call: Your local emergency services (911 in the [place] A suicide crisis helpline, such as the [place] at (204)124-8504. This is open 24 hours a day. Summary Taking steps to learn and use tension reduction techniques can help calm you and help prevent triggering an anxiety reaction. When used together, medicines, psychotherapy, and tension reduction techniques may be the most effective treatment. Family, friends, and partners can play a big part in helping you recover from an anxiety disorder. This information is not intended to replace advice given to you by your health care provider. Make sure you discuss any questions you have with your healthcare provider. Document Revised: 09/27/2018 Document Reviewed: 09/27/2018 Elsevier Patient Education  [place].

## 2020-12-20 NOTE — Assessment & Plan Note (Addendum)
Chronic, worsening in last 2weeks,  describes episodes of feeling restless and palpitation unknown trigger She is not interested in psychology referral, but agreed to try buspar BID. F/up in 78month

## 2020-12-20 NOTE — Assessment & Plan Note (Signed)
Home BP readings 140s/90s, associated with headache and palpitations. complaints with amlodipine and losartan, and DASh diet. BP Readings from Last 3 Encounters:  12/20/20 124/90  12/02/20 129/87  11/28/20 120/84   Maintain amlodipine dose increase losartan to '25mg'$  BID Continue to monitor BP in [place] F/up in 22month

## 2020-12-20 NOTE — Progress Notes (Signed)
Subjective:  Patient ID: Abigail [name], female    DOB: 12/21/1970  Age: 50 y.o. MRN: [id number]  CC: Follow-up (Pt states BP has been elevated a few times and she would like to discuss this./Pt states she has also been feeling very anxious lately and would like to discuss that because it could be affecting her BP. /Pt scheduled for colonoscopy in 01/2021)  HPI  Generalized anxiety disorder Chronic, worsening in last 2weeks,  describes episodes of feeling restless and palpitation unknown trigger She is not interested in psychology referral, but agreed to try buspar BID. F/up in 84month HTN (hypertension) Home BP readings 140s/90s, associated with headache and palpitations. complaints with amlodipine and losartan, and DASh diet. BP Readings from Last 3 Encounters:  12/20/20 124/90  12/02/20 129/87  11/28/20 120/84   Maintain amlodipine dose increase losartan to '25mg'$  BID Continue to monitor BP in Am F/up in 131month Reviewed past Medical, Social and Family history today.  Outpatient Medications Prior to Visit  Medication Sig Dispense Refill   amLODipine (NORVASC) 10 MG tablet Take 1 tablet (10 mg total) by mouth daily. 90 tablet 3   diphenhydrAMINE (BENADRYL) 25 MG tablet Take 25 mg by mouth as needed.     fluticasone (FLONASE) 50 MCG/ACT nasal spray SHAKE LIQUID AND USE 1 SPRAY IN EACH NOSTRIL DAILY 16 g 5   loratadine (CLARITIN) 10 MG tablet Take 10 mg by mouth daily.     omeprazole (PRILOSEC) 20 MG capsule Take 1 capsule (20 mg total) by mouth daily. 90 capsule 1   PEG-KCl-NaCl-NaSulf-Na Asc-C (PLENVU) 140 g SOLR Use as directed for colonoscopy prep.[id number] each 0   SUMAtriptan (IMITREX) 25 MG tablet Take 1-2 tablets (25-50 mg total) by mouth every 2 (two) hours as needed for migraine. May repeat in 2 hours if headache persists or recurs. No more than '100mg'$  in 24hrs 10 tablet 0   triamcinolone ointment (KENALOG) 0.5 % Apply 1  application topically 2 (two) times daily. You may use for 2 weeks at a time as needed. 30 g 0   Vitamin D, Ergocalciferol, 2000 units CAPS Take 1 capsule by mouth daily after breakfast. 90 capsule 1   losartan (COZAAR) 25 MG tablet TAKE 1 TABLET(25 MG) BY MOUTH DAILY 90 tablet 3   lubiprostone (AMITIZA) 8 MCG capsule Take 1 capsule (8 mcg total) by mouth 2 (two) times daily with a meal. 60 capsule 2   No facility-administered medications prior to visit.    ROS See HPI  Objective:  BP 124/90 (BP Location: Left Arm, Patient Position: Sitting, Cuff Size: Normal)   Pulse 74   Temp (!) 97.2 F (36.2 C) (Temporal)   Ht '5\' 5"'$  (1.651 m)   Wt 181 lb (82.1 kg)   LMP 01/18/2012 (Exact Date)   SpO2 98%   BMI 30.12 kg/m   Physical Exam Vitals reviewed.  Cardiovascular:     Rate and Rhythm: Normal rate and regular rhythm.     Pulses: Normal pulses.  Pulmonary:     Effort: Pulmonary effort is normal.  Neurological:     Mental Status: She is alert and oriented to person, place, and time.  Psychiatric:        Attention and Perception: Attention normal.        Mood and Affect: Mood is anxious.        Speech: Speech normal.        Behavior: Behavior normal.  Cognition and Memory: Cognition and memory normal.   Assessment & Plan:  This visit occurred during the SARS-CoV-2 public health emergency.  Safety protocols were in place, including screening questions prior to the visit, additional usage of staff PPE, and extensive cleaning of exam room while observing appropriate contact time as indicated for disinfecting solutions.   Toshie was seen today for follow-up.  Diagnoses and all orders for this visit:  Primary hypertension -     losartan (COZAAR) 25 MG tablet; Take 1 tablet (25 mg total) by mouth 2 (two) times daily.  Generalized anxiety disorder -     busPIRone (BUSPAR) 5 MG tablet; Take 1 tablet (5 mg total) by mouth 2 (two) times daily.   Problem List Items Addressed  This Visit       Cardiovascular and Mediastinum   HTN (hypertension) - Primary    Home BP readings 140s/90s, associated with headache and palpitations. complaints with amlodipine and losartan, and DASh diet. BP Readings from Last 3 Encounters:  12/20/20 124/90  12/02/20 129/87  11/28/20 120/84   Maintain amlodipine dose increase losartan to '25mg'$  BID Continue to monitor BP in Am F/up in 77month     Relevant Medications   losartan (COZAAR) 25 MG tablet     Other   Generalized anxiety disorder    Chronic, worsening in last 2weeks,  describes episodes of feeling restless and palpitation unknown trigger She is not interested in psychology referral, but agreed to try buspar BID. F/up in 155month    Relevant Medications   busPIRone (BUSPAR) 5 MG tablet     Follow-up: Return in about 4 weeks (around 01/17/2021) for CPE (fasting), HTN and anxiety f/up.  ChWilfred LacyNP

## 2021-01-23 ENCOUNTER — Other Ambulatory Visit: Payer: Self-pay

## 2021-01-23 ENCOUNTER — Ambulatory Visit (INDEPENDENT_AMBULATORY_CARE_PROVIDER_SITE_OTHER): Payer: 59 | Admitting: Nurse Practitioner

## 2021-01-23 ENCOUNTER — Encounter: Payer: Self-pay | Admitting: Nurse Practitioner

## 2021-01-23 VITALS — BP 116/70 | HR 92 | Temp 98.0°F | Ht 64.75 in | Wt 183.6 lb

## 2021-01-23 DIAGNOSIS — Z136 Encounter for screening for cardiovascular disorders: Secondary | ICD-10-CM

## 2021-01-23 DIAGNOSIS — I10 Essential (primary) hypertension: Secondary | ICD-10-CM

## 2021-01-23 DIAGNOSIS — R739 Hyperglycemia, unspecified: Secondary | ICD-10-CM

## 2021-01-23 DIAGNOSIS — K5904 Chronic idiopathic constipation: Secondary | ICD-10-CM

## 2021-01-23 DIAGNOSIS — F411 Generalized anxiety disorder: Secondary | ICD-10-CM | POA: Diagnosis not present

## 2021-01-23 DIAGNOSIS — Z13220 Encounter for screening for lipoid disorders: Secondary | ICD-10-CM

## 2021-01-23 DIAGNOSIS — Z0001 Encounter for general adult medical examination with abnormal findings: Secondary | ICD-10-CM | POA: Diagnosis not present

## 2021-01-23 MED ORDER — BUSPIRONE HCL 5 MG PO TABS: 5.0000 mg | ORAL_TABLET | Freq: Three times a day (TID) | ORAL | 5 refills | Status: DC

## 2021-01-23 NOTE — Patient Instructions (Signed)
Schedule lab appt for fasting blood draw. Need to be fasting 6-8hrs prior to blood draw Ok to drink water.  Maintain current medication doses.  Preventive Care 84-51 Years Old, Female Preventive care refers to lifestyle choices and visits with your health care provider that can promote health and wellness. This includes: A yearly physical exam. This is also called an annual wellness visit. Regular dental and eye exams. Immunizations. Screening for certain conditions. Healthy lifestyle choices, such as: Eating a healthy diet. Getting regular exercise. Not using drugs or products that contain nicotine and tobacco. Limiting alcohol use. What can I expect for my preventive care visit? Physical exam Your health care provider will check your: Height and weight. These may be used to calculate your BMI (body mass index). BMI is a measurement that tells if you are at a healthy weight. Heart rate and blood pressure. Body temperature. Skin for abnormal spots. Counseling Your health care provider may ask you questions about your: Past medical problems. Family's medical history. Alcohol, tobacco, and drug use. Emotional well-being. Home life and relationship well-being. Sexual activity. Diet, exercise, and sleep habits. Work and work Statistician. Access to firearms. Method of birth control. Menstrual cycle. Pregnancy history. What immunizations do I need? Vaccines are usually given at various ages, according to a schedule. Your health care provider will recommend vaccines for you based on your age, medical history, and lifestyle or other factors, such as travel or where you work. What tests do I need? Blood tests Lipid and cholesterol levels. These may be checked every 5 years, or more often if you are over 55 years old. Hepatitis C test. Hepatitis B test. Screening Lung cancer screening. You may have this screening every year starting at age 68 if you have a 30-pack-year history of  smoking and currently smoke or have quit within the past 15 years. Colorectal cancer screening. All adults should have this screening starting at age 29 and continuing until age 7. Your health care provider may recommend screening at age 2 if you are at increased risk. You will have tests every 1-10 years, depending on your results and the type of screening test. Diabetes screening. This is done by checking your blood sugar (glucose) after you have not eaten for a while (fasting). You may have this done every 1-3 years. Mammogram. This may be done every 1-2 years. Talk with your health care provider about when you should start having regular mammograms. This may depend on whether you have a family history of breast cancer. BRCA-related cancer screening. This may be done if you have a family history of breast, ovarian, tubal, or peritoneal cancers. Pelvic exam and Pap test. This may be done every 3 years starting at age 26. Starting at age 69, this may be done every 5 years if you have a Pap test in combination with an HPV test. Other tests STD (sexually transmitted disease) testing, if you are at risk. Bone density scan. This is done to screen for osteoporosis. You may have this scan if you are at high risk for osteoporosis. Talk with your health care provider about your test results, treatment options, and if necessary, the need for more tests. Follow these instructions at home: Eating and drinking  Eat a diet that includes fresh fruits and vegetables, whole grains, lean protein, and low-fat dairy products. Take vitamin and mineral supplements as recommended by your health care provider. Do not drink alcohol if: Your health care provider tells you not to drink. You  are pregnant, may be pregnant, or are planning to become pregnant. If you drink alcohol: Limit how much you have to 0-1 drink a day. Be aware of how much alcohol is in your drink. In the [place] one drink equals one 12 oz  bottle of beer (355 mL), one 5 oz glass of wine (148 mL), or one 1 oz glass of hard liquor (44 mL). Lifestyle Take daily care of your teeth and gums. Brush your teeth every morning and night with fluoride toothpaste. Floss one time each day. Stay active. Exercise for at least 30 minutes 5 or more days each week. Do not use any products that contain nicotine or tobacco, such as cigarettes, e-cigarettes, and chewing tobacco. If you need help quitting, ask your health care provider. Do not use drugs. If you are sexually active, practice safe sex. Use a condom or other form of protection to prevent STIs (sexually transmitted infections). If you do not wish to become pregnant, use a form of birth control. If you plan to become pregnant, see your health care provider for a prepregnancy visit. If told by your health care provider, take low-dose aspirin daily starting at age 40. Find healthy ways to cope with stress, such as: Meditation, yoga, or listening to music. Journaling. Talking to a trusted person. Spending time with friends and family. Safety Always wear your seat belt while driving or riding in a vehicle. Do not drive: If you have been drinking alcohol. Do not ride with someone who has been drinking. When you are tired or distracted. While texting. Wear a helmet and other protective equipment during sports activities. If you have firearms in your house, make sure you follow all gun safety procedures. What's next? Visit your health care provider once a year for an annual wellness visit. Ask your health care provider how often you should have your eyes and teeth checked. Stay up to date on all vaccines. This information is not intended to replace advice given to you by your health care provider. Make sure you discuss any questions you have with your health care provider. Document Revised: 07/05/2020 Document Reviewed: 01/06/2018 Elsevier Patient Education  2022 [employer].

## 2021-01-23 NOTE — Progress Notes (Signed)
Subjective:    Patient ID: Abigail [name], female    DOB: [birth year], 50 y.o.   MRN: [id number]  Patient presents today for CPE and eval of chronic conditions  HPI Generalized anxiety disorder Improved mood with buspar 1-2x/day. Consider cymbalta if needed  HTN (hypertension) Improved BP with no adverse effects Home BP: 113-123/68-86 BP Readings from Last 3 Encounters:  01/23/21 116/70  12/20/20 124/90  12/02/20 129/87   Maintain current medications  Chronic idiopathic constipation Unable to afford linzess Advised to try benefiber and probiotics. Colonoscopy scheduled for 02/2021  Vision:up to date Dental:up to date Diet:heart healthy Exercise:none Weight:  Wt Readings from Last 3 Encounters:  01/23/21 183 lb 9.6 oz (83.3 kg)  12/20/20 181 lb (82.1 kg)  11/28/20 182 lb 8 oz (82.8 kg)    Sexual History (orientation,birth control, marital status, STD):deferred pelvic and breast exam to GYN  Depression/Suicide: Depression screen [facility] 2/9 12/20/2020 08/29/2019 08/23/2018 07/27/2017 03/19/2017 08/20/2016 06/17/2012  Decreased Interest 0 0 0 0 0 0 0  Down, Depressed, Hopeless 0 0 0 0 0 0 0  PHQ - 2 Score 0 0 0 0 0 0 0  Altered sleeping 0 - - 1 - - -  Tired, decreased energy 1 - - 0 - - -  Change in appetite 0 - - 0 - - -  Feeling bad or failure about yourself  0 - - 0 - - -  Trouble concentrating 0 - - 0 - - -  Moving slowly or fidgety/restless 0 - - 0 - - -  Suicidal thoughts 0 - - 0 - - -  PHQ-9 Score 1 - - 1 - - -  Difficult doing work/chores Not difficult at all - - - - - -   Immunizations: (TDAP, Hep C screen, Pneumovax, Influenza, zoster)  Health Maintenance  Topic Date Due   Colon Cancer Screening  Never done   Zoster (Shingles) Vaccine (1 of 2) Never done   COVID-19 Vaccine (3 - Booster for Pfizer series) 01/09/2020   Flu Shot  12/09/2020   Mammogram  11/28/2021   Pap Smear  07/27/2023   Tetanus Vaccine  04/10/2024   Hepatitis C Screening: USPSTF  Recommendation to screen - Ages 18-79 yo.  Completed   HIV Screening  Completed   HPV Vaccine  Aged Out   Fall Risk: Fall Risk  03/20/2020 08/29/2019 08/23/2018 03/19/2017 08/20/2016 06/17/2012  Falls in the past year? 0 0 0 No No No  Number falls in past yr: - 0 - - - -  Injury with Fall? - 0 - - - -   Medications and allergies reviewed with patient and updated if appropriate.  Patient Active Problem List   Diagnosis Date Noted   Urticaria due to food allergy 08/27/2020   Chronic idiopathic constipation 08/29/2019   Intermittent palpitations 11/29/2018   Migraine without aura and without status migrainosus, not intractable 10/19/2018   Chronic right-sided low back pain without sciatica 12/13/2017   Vitamin D insufficiency 07/27/2017   Seasonal allergic rhinitis 07/27/2017   Gallop rhythm 06/25/2017   Generalized anxiety disorder 04/10/2016   Hyperglycemia 04/10/2016   GERD (gastroesophageal reflux disease) 04/10/2016   Environmental allergies 06/17/2012   S/p Abdominal Supracervical Hysterectomy, Left Salpingoohorectomy, Right Salpingectomy on 01/18/12 01/18/2012   HTN (hypertension) 10/29/2011    Current Outpatient Medications on File Prior to Visit  Medication Sig Dispense Refill   amLODipine (NORVASC) 10 MG tablet Take 1 tablet (10 mg total) by mouth daily. 90 tablet  3   diphenhydrAMINE (BENADRYL) 25 MG tablet Take 25 mg by mouth as needed.     fluticasone (FLONASE) 50 MCG/ACT nasal spray SHAKE LIQUID AND USE 1 SPRAY IN EACH NOSTRIL DAILY 16 g 5   loratadine (CLARITIN) 10 MG tablet Take 10 mg by mouth daily.     losartan (COZAAR) 25 MG tablet Take 1 tablet (25 mg total) by mouth 2 (two) times daily. 180 tablet 3   omeprazole (PRILOSEC) 20 MG capsule Take 1 capsule (20 mg total) by mouth daily. 90 capsule 1   SUMAtriptan (IMITREX) 25 MG tablet Take 1-2 tablets (25-50 mg total) by mouth every 2 (two) hours as needed for migraine. May repeat in 2 hours if headache persists or recurs.  No more than '100mg'$  in 24hrs 10 tablet 0   triamcinolone ointment (KENALOG) 0.5 % Apply 1 application topically 2 (two) times daily. You may use for 2 weeks at a time as needed. 30 g 0   Vitamin D, Ergocalciferol, 2000 units CAPS Take 1 capsule by mouth daily after breakfast. 90 capsule 1   No current facility-administered medications on file prior to visit.    Past Medical History:  Diagnosis Date   Anemia    iron supp   Anxiety    COVID 05/2020   Depression    Fibroids    GERD (gastroesophageal reflux disease)    omeprazole   Headache(784.0)    Hypertension 2012   Maxide   S/P LEEP 1992   Urticaria     Past Surgical History:  Procedure Laterality Date   Inicision and drainage of thrombosed hemorrhoid  04/06/2018   LEEP  1998   Abnormal pap   SUPRACERVICAL ABDOMINAL HYSTERECTOMY  01/18/2012   Procedure: HYSTERECTOMY SUPRACERVICAL ABDOMINAL with LSO;  Surgeon: Osborne Oman, MD;  Location: [facility];  Service: Gynecology;  Laterality: N/A;   TUBAL LIGATION  1997   WISDOM TOOTH EXTRACTION      Social History   Socioeconomic History   Marital status: Married    Spouse name: Not on file   Number of children: 2   Years of education: 14   Highest education level: Not on file  Occupational History   Occupation: MEDICAL ASSISTANT    Employer: [employer]  Tobacco Use   Smoking status: Never   Smokeless tobacco: Never  Vaping Use   Vaping Use: Never used  Substance and Sexual Activity   Alcohol use: Yes    Alcohol/week: 1.0 standard drink    Types: 1 Standard drinks or equivalent per week    Comment: occasional, 23 weekly   Drug use: No   Sexual activity: Yes    Birth control/protection: Surgical    Comment: Hysterectomy  Other Topics Concern   Not on file  Social History Narrative   Regular exercise-no   Caffeine Use-yes   Social Determinants of Health   Financial Resource Strain: Not on file  Food Insecurity: Not on file  Transportation Needs: Not  on file  Physical Activity: Not on file  Stress: Not on file  Social Connections: Not on file   Family History  Problem Relation Age of Onset   Hypertension Mother    Arthritis Mother    Pulmonary fibrosis Mother    Hypertension Father    Depression Father    Diabetes Father    Alcohol abuse Father    Colonic polyp Father    Diabetes Sister    Congestive Heart Failure Sister    Hypertension Sister  Diabetes Brother    Breast cancer Paternal Grandmother    Heart attack Paternal Grandmother    Breast cancer Daughter    Allergic rhinitis Neg Hx    Angioedema Neg Hx    Asthma Neg Hx    Eczema Neg Hx    Immunodeficiency Neg Hx    Urticaria Neg Hx        Review of Systems  Constitutional:  Negative for fever, malaise/fatigue and weight loss.  HENT:  Negative for congestion and sore throat.   Eyes:        Negative for visual changes  Respiratory:  Negative for cough and shortness of breath.   Cardiovascular:  Negative for chest pain, palpitations and leg swelling.  Gastrointestinal:  Negative for blood in stool, constipation, diarrhea and heartburn.  Genitourinary:  Negative for dysuria, frequency and urgency.  Musculoskeletal:  Negative for falls, joint pain and myalgias.  Skin:  Negative for rash.  Neurological:  Negative for dizziness, sensory change and headaches.  Endo/Heme/Allergies:  Does not bruise/bleed easily.  Psychiatric/Behavioral:  Negative for depression, substance abuse and suicidal ideas. The patient is not nervous/anxious.    Objective:   Vitals:   01/23/21 1306  BP: 116/70  Pulse: 92  Temp: 98 F (36.7 C)  SpO2: 98%   Body mass index is 30.79 kg/m.  Physical Examination:  Physical Exam Vitals reviewed.  Constitutional:      General: She is not in acute distress.    Appearance: She is well-developed.  HENT:     Right Ear: Tympanic membrane, ear canal and external ear normal.     Left Ear: Tympanic membrane, ear canal and external ear  normal.  Eyes:     Extraocular Movements: Extraocular movements intact.     Conjunctiva/sclera: Conjunctivae normal.  Cardiovascular:     Rate and Rhythm: Normal rate and regular rhythm.     Heart sounds: Normal heart sounds.  Pulmonary:     Effort: Pulmonary effort is normal. No respiratory distress.     Breath sounds: Normal breath sounds.  Chest:     Chest wall: No tenderness.  Abdominal:     General: Bowel sounds are normal.     Palpations: Abdomen is soft.  Musculoskeletal:        General: Normal range of motion.     Cervical back: Normal range of motion and neck supple.     Right lower leg: No edema.     Left lower leg: No edema.  Lymphadenopathy:     Cervical: No cervical adenopathy.  Skin:    General: Skin is warm and dry.  Neurological:     Mental Status: She is alert and oriented to person, place, and time.     Deep Tendon Reflexes: Reflexes are normal and symmetric.  Psychiatric:        Mood and Affect: Mood normal.        Behavior: Behavior normal.        Thought Content: Thought content normal.   ASSESSMENT and PLAN: This visit occurred during the SARS-CoV-2 public health emergency.  Safety protocols were in place, including screening questions prior to the visit, additional usage of staff PPE, and extensive cleaning of exam room while observing appropriate contact time as indicated for disinfecting solutions.   Shahnaz was seen today for annual exam.  Diagnoses and all orders for this visit:  Encounter for preventative adult health care exam with abnormal findings -     Comprehensive metabolic panel; Future -  Lipid panel; Future -     CBC; Future  Primary hypertension  Hyperglycemia -     Hemoglobin A1c; Future  Generalized anxiety disorder -     busPIRone (BUSPAR) 5 MG tablet; Take 1 tablet (5 mg total) by mouth 3 (three) times daily.  Encounter for lipid screening for cardiovascular disease -     Lipid panel; Future  Chronic idiopathic  constipation     Problem List Items Addressed This Visit       Cardiovascular and Mediastinum   HTN (hypertension)    Improved BP with no adverse effects Home BP: 113-123/68-86 BP Readings from Last 3 Encounters:  01/23/21 116/70  12/20/20 124/90  12/02/20 129/87  Maintain current medications        Digestive   Chronic idiopathic constipation    Unable to afford linzess Advised to try benefiber and probiotics. Colonoscopy scheduled for 02/2021        Other   Generalized anxiety disorder    Improved mood with buspar 1-2x/day. Consider cymbalta if needed      Relevant Medications   busPIRone (BUSPAR) 5 MG tablet   Hyperglycemia   Relevant Orders   Hemoglobin A1c   Other Visit Diagnoses     Encounter for preventative adult health care exam with abnormal findings    -  Primary   Relevant Orders   Comprehensive metabolic panel   Lipid panel   CBC   Encounter for lipid screening for cardiovascular disease       Relevant Orders   Lipid panel       Follow up: Return in about 4 weeks (around 02/20/2021) for anxiety (video).  Wilfred Lacy, NP

## 2021-01-25 ENCOUNTER — Encounter: Payer: Self-pay | Admitting: Nurse Practitioner

## 2021-01-25 NOTE — Assessment & Plan Note (Signed)
Unable to afford linzess Advised to try benefiber and probiotics. Colonoscopy scheduled for 02/2021

## 2021-01-25 NOTE — Assessment & Plan Note (Signed)
Improved mood with buspar 1-2x/day. Consider cymbalta if needed

## 2021-01-25 NOTE — Assessment & Plan Note (Signed)
Improved BP with no adverse effects Home BP: 113-123/68-86 BP Readings from Last 3 Encounters:  01/23/21 116/70  12/20/20 124/90  12/02/20 129/87   Maintain current medications

## 2021-01-28 ENCOUNTER — Encounter: Payer: 59 | Admitting: Gastroenterology

## 2021-01-31 ENCOUNTER — Other Ambulatory Visit (INDEPENDENT_AMBULATORY_CARE_PROVIDER_SITE_OTHER): Payer: 59

## 2021-01-31 ENCOUNTER — Other Ambulatory Visit: Payer: Self-pay

## 2021-01-31 DIAGNOSIS — R739 Hyperglycemia, unspecified: Secondary | ICD-10-CM

## 2021-01-31 DIAGNOSIS — Z136 Encounter for screening for cardiovascular disorders: Secondary | ICD-10-CM | POA: Diagnosis not present

## 2021-01-31 DIAGNOSIS — Z13220 Encounter for screening for lipoid disorders: Secondary | ICD-10-CM | POA: Diagnosis not present

## 2021-01-31 DIAGNOSIS — Z0001 Encounter for general adult medical examination with abnormal findings: Secondary | ICD-10-CM

## 2021-01-31 LAB — COMPREHENSIVE METABOLIC PANEL
ALT: 9 U/L (ref 0–35)
AST: 17 U/L (ref 0–37)
Albumin: 4.2 g/dL (ref 3.5–5.2)
Alkaline Phosphatase: 60 U/L (ref 39–117)
BUN: 15 mg/dL (ref 6–23)
CO2: 29 mEq/L (ref 19–32)
Calcium: 9.1 mg/dL (ref 8.4–10.5)
Chloride: 102 mEq/L (ref 96–112)
Creatinine, Ser: 0.78 mg/dL (ref 0.40–1.20)
GFR: 88 mL/min (ref >60.00)
Glucose, Bld: 87 mg/dL (ref 70–99)
Potassium: 4.1 mEq/L (ref 3.5–5.1)
Sodium: 138 mEq/L (ref 135–145)
Total Bilirubin: 0.4 mg/dL (ref 0.2–1.2)
Total Protein: 7.3 g/dL (ref 6.0–8.3)

## 2021-01-31 LAB — LIPID PANEL
Cholesterol: 180 mg/dL (ref 0–200)
HDL: 50.5 mg/dL (ref >39.00)
LDL Cholesterol: 112 mg/dL — ABNORMAL HIGH (ref 0–99)
NonHDL: 129.71
Total CHOL/HDL Ratio: 4
Triglycerides: 90 mg/dL (ref 0.0–149.0)
VLDL: 18 mg/dL (ref 0.0–40.0)

## 2021-01-31 LAB — CBC
HCT: 38.5 % (ref 36.0–46.0)
Hemoglobin: 12.6 g/dL (ref 12.0–15.0)
MCHC: 32.8 g/dL (ref 30.0–36.0)
MCV: 91.7 fl (ref 78.0–100.0)
Platelets: 310 10*3/uL (ref 150.0–400.0)
RBC: 4.2 Mil/uL (ref 3.87–5.11)
RDW: 13.5 % (ref 11.5–15.5)
WBC: 9.8 10*3/uL (ref 4.0–10.5)

## 2021-01-31 LAB — HEMOGLOBIN A1C: Hgb A1c MFr Bld: 6.2 % (ref 4.6–6.5)

## 2021-02-14 ENCOUNTER — Other Ambulatory Visit: Payer: Self-pay | Admitting: Nurse Practitioner

## 2021-02-14 DIAGNOSIS — K219 Gastro-esophageal reflux disease without esophagitis: Secondary | ICD-10-CM

## 2021-02-20 ENCOUNTER — Other Ambulatory Visit: Payer: Self-pay

## 2021-02-20 DIAGNOSIS — K219 Gastro-esophageal reflux disease without esophagitis: Secondary | ICD-10-CM

## 2021-02-20 MED ORDER — OMEPRAZOLE 20 MG PO CPDR: 20.0000 mg | DELAYED_RELEASE_CAPSULE | Freq: Every day | ORAL | 0 refills | Status: DC

## 2021-02-24 ENCOUNTER — Encounter: Payer: Self-pay | Admitting: Nurse Practitioner

## 2021-02-24 ENCOUNTER — Encounter: Payer: 59 | Admitting: Gastroenterology

## 2021-02-25 ENCOUNTER — Telehealth: Payer: 59 | Admitting: Nurse Practitioner

## 2021-03-05 ENCOUNTER — Encounter: Payer: Self-pay | Admitting: Nurse Practitioner

## 2021-03-05 DIAGNOSIS — F411 Generalized anxiety disorder: Secondary | ICD-10-CM

## 2021-03-10 NOTE — Progress Notes (Signed)
GYNECOLOGY  VISIT   HPI: 50 y.o.   Married  [place] American  female   [phone] with Patient's last menstrual period was 01/18/2012 (exact date).   here for vaginal discharge. Also complaining of dysuria.  Symptoms started most noticeably last week.   Some noticed a lot of discharge for a day, and then it resolved. She noted some burning at the urination also, which comes and goes.  No urgency to void.  No pressure.  No back pain.   No odor.   Using Loom, a body deodorizer in the vulvar area.   Not douching.   No new partner change.  Always has vaginal dryness.   She did not have genetic counseling.   GYNECOLOGIC HISTORY: Patient's last menstrual period was 01/18/2012 (exact date). Contraception: Hyst Menopausal hormone therapy:  None Last mammogram:  11-28-20  3D/Neg/Birads1 Last pap smear:   07/28/16 Neg:Neg HR HPV        OB History     Gravida  3   Para  2   Term  2   Preterm  0   AB  1   Living  2      SAB  1   IAB  0   Ectopic  0   Multiple  0   Live Births  2              Patient Active Problem List   Diagnosis Date Noted   Urticaria due to food allergy 08/27/2020   Chronic idiopathic constipation 08/29/2019   Intermittent palpitations 11/29/2018   Migraine without aura and without status migrainosus, not intractable 10/19/2018   Chronic right-sided low back pain without sciatica 12/13/2017   Vitamin D insufficiency 07/27/2017   Seasonal allergic rhinitis 07/27/2017   Gallop rhythm 06/25/2017   Generalized anxiety disorder 04/10/2016   Hyperglycemia 04/10/2016   GERD (gastroesophageal reflux disease) 04/10/2016   Environmental allergies 06/17/2012   S/p Abdominal Supracervical Hysterectomy, Left Salpingoohorectomy, Right Salpingectomy on 01/18/12 01/18/2012   HTN (hypertension) 10/29/2011    Past Medical History:  Diagnosis Date   Anemia    iron supp   Anxiety    COVID 05/2020   Depression    Fibroids    GERD (gastroesophageal  reflux disease)    omeprazole   Headache(784.0)    Hypertension 2012   Maxide   S/P LEEP 1992   Urticaria     Past Surgical History:  Procedure Laterality Date   Inicision and drainage of thrombosed hemorrhoid  04/06/2018   LEEP  1998   Abnormal pap   SUPRACERVICAL ABDOMINAL HYSTERECTOMY  01/18/2012   Procedure: HYSTERECTOMY SUPRACERVICAL ABDOMINAL with LSO;  Surgeon: Osborne Oman, MD;  Location: [facility];  Service: Gynecology;  Laterality: N/A;   TUBAL LIGATION  1997   WISDOM TOOTH EXTRACTION      Current Outpatient Medications  Medication Sig Dispense Refill   amLODipine (NORVASC) 10 MG tablet Take 1 tablet (10 mg total) by mouth daily. 90 tablet 3   busPIRone (BUSPAR) 5 MG tablet Take 1 tablet (5 mg total) by mouth 3 (three) times daily. 90 tablet 5   diphenhydrAMINE (BENADRYL) 25 MG tablet Take 25 mg by mouth as needed.     fluticasone (FLONASE) 50 MCG/ACT nasal spray SHAKE LIQUID AND USE 1 SPRAY IN EACH NOSTRIL DAILY 16 g 5   loratadine (CLARITIN) 10 MG tablet Take 10 mg by mouth daily.     losartan (COZAAR) 25 MG tablet Take 1 tablet (25 mg total)  by mouth 2 (two) times daily. 180 tablet 3   omeprazole (PRILOSEC) 20 MG capsule Take 1 capsule (20 mg total) by mouth daily. 90 capsule 0   SUMAtriptan (IMITREX) 25 MG tablet Take 1-2 tablets (25-50 mg total) by mouth every 2 (two) hours as needed for migraine. May repeat in 2 hours if headache persists or recurs. No more than 100mg  in 24hrs 10 tablet 0   triamcinolone ointment (KENALOG) 0.5 % Apply 1 application topically 2 (two) times daily. You may use for 2 weeks at a time as needed. 30 g 0   Vitamin D, Ergocalciferol, 2000 units CAPS Take 1 capsule by mouth daily after breakfast. 90 capsule 1   No current facility-administered medications for this visit.     ALLERGIES: Shellfish allergy and Tylox [oxycodone-acetaminophen]  Family History  Problem Relation Age of Onset   Hypertension Mother    Arthritis Mother     Pulmonary fibrosis Mother    Hypertension Father    Depression Father    Diabetes Father    Alcohol abuse Father    Colonic polyp Father    Diabetes Sister    Congestive Heart Failure Sister    Hypertension Sister    Diabetes Brother    Breast cancer Paternal Grandmother    Heart attack Paternal Grandmother    Breast cancer Daughter    Allergic rhinitis Neg Hx    Angioedema Neg Hx    Asthma Neg Hx    Eczema Neg Hx    Immunodeficiency Neg Hx    Urticaria Neg Hx     Social History   Socioeconomic History   Marital status: Married    Spouse name: Not on file   Number of children: 2   Years of education: 14   Highest education level: Not on file  Occupational History   Occupation: MEDICAL ASSISTANT    Employer:  [employer]  Tobacco Use   Smoking status: Never   Smokeless tobacco: Never  Vaping Use   Vaping Use: Never used  Substance and Sexual Activity   Alcohol use: Yes    Alcohol/week: 1.0 standard drink    Types: 1 Standard drinks or equivalent per week    Comment: occasional, 23 weekly   Drug use: No   Sexual activity: Yes    Birth control/protection: Surgical    Comment: Hysterectomy  Other Topics Concern   Not on file  Social History Narrative   Regular exercise-no   Caffeine Use-yes   Social Determinants of Health   Financial Resource Strain: Not on file  Food Insecurity: Not on file  Transportation Needs: Not on file  Physical Activity: Not on file  Stress: Not on file  Social Connections: Not on file  Intimate Partner Violence: Not on file    Review of Systems  Genitourinary:  Positive for dysuria and vaginal discharge.  All other systems reviewed and are negative.  PHYSICAL EXAMINATION:    BP (!) 142/80   Pulse 73   Ht 5' 4.25" (1.632 m)   Wt 184 lb (83.5 kg)   LMP 01/18/2012 (Exact Date)   SpO2 99%   BMI 31.34 kg/m     General appearance: alert, cooperative and appears stated age   Pelvic: External genitalia:  no lesions               Urethra:  normal appearing urethra with no masses, tenderness or lesions              Bartholins and Skenes:  normal                 Vagina: normal appearing vagina with normal color and minimal white discharge, no lesions              Cervix: no lesions                Bimanual Exam:  Uterus:  Absent              Adnexa: no mass, fullness, tenderness               Chaperone was present for exam:  Raquel Sarna RN and Lovena Le CMA.  ASSESSMENT  Status post supracervical hysterectomy, LSO, right salpingectomy for fibroids.  Right ovary remains. Vaginitis.  Dysuria.  Vaginal atrophy.  FH breast cancer in daughter in her 4s.  Uncertain if daughter had genetic counseling.   PLAN  Urinalysis:  sg 1.015, ph 6.0, neg nitrites, 0 - 5 WBC, 0 - 2 RBC, 0 - 5 squams, mod bacteria, many mucus. 0 - 1 hyaline casts.  UC sent.  She declines an abx while we wait for the final UC. Wet prep:  neg clue cells, neg yeast, net trichomonas. Vaginal atrophy discussed and options for care:  water based lubricants, cooking oils, vit E vaginal suppositories and vaginal estrogen treatment.  She will try vit E.  Referral to genetic counseling and possible testing.  We discussed the benefits of this referral today, which has increased her understanding of why she may want to proceed.   An After Visit Summary was printed and given to the patient.   26 min  total time was spent for this patient encounter, including preparation, face-to-face counseling with the patient, coordination of care, and documentation of the encounter.

## 2021-03-11 ENCOUNTER — Other Ambulatory Visit: Payer: Self-pay

## 2021-03-11 ENCOUNTER — Ambulatory Visit: Payer: 59 | Admitting: Obstetrics and Gynecology

## 2021-03-11 ENCOUNTER — Encounter: Payer: Self-pay | Admitting: Obstetrics and Gynecology

## 2021-03-11 VITALS — BP 142/80 | HR 73 | Ht 64.25 in | Wt 184.0 lb

## 2021-03-11 DIAGNOSIS — N760 Acute vaginitis: Secondary | ICD-10-CM | POA: Diagnosis not present

## 2021-03-11 DIAGNOSIS — Z803 Family history of malignant neoplasm of breast: Secondary | ICD-10-CM

## 2021-03-11 DIAGNOSIS — R300 Dysuria: Secondary | ICD-10-CM

## 2021-03-11 LAB — WET PREP FOR TRICH, YEAST, CLUE

## 2021-03-11 NOTE — Patient Instructions (Signed)
Atrophic Vaginitis Atrophic vaginitis is a condition in which the tissues that line the vagina become dry and thin. This condition is most common in women who have stopped having regular menstrual periods (are in menopause). This usually starts when a woman is 45 to 51 years old. That is the time when a woman's estrogen levels begin to decrease. Estrogen is a female hormone. It helps to keep the tissues of the vagina moist. It stimulates the vagina to produce a clear fluid that lubricates the vagina for sex. This fluid also protects the vagina from infection. Lack of estrogen can cause the lining of the vagina to get thinner and dryer. The vagina may also shrink in size. It may become less elastic. Atrophic vaginitis tends to get worse over time as a woman's estrogen level drops. What are the causes? This condition is caused by the normal drop in estrogen that happens around the time of menopause. What increases the risk? Certain conditions or situations may lower a woman's estrogen level, leading to a higher risk for atrophic vaginitis. You are more likely to develop this condition if: You are taking medicines that block estrogen. You have had your ovaries removed. You are being treated for cancer with radiation or medicines (chemotherapy). You have given birth or are breastfeeding. You are older than age 50. You smoke. What are the signs or symptoms? Symptoms of this condition include: Pain, soreness, a feeling of pressure, or bleeding during sex (dyspareunia). Vaginal burning, irritation, or itching. Pain or bleeding when a speculum is used in a vaginal exam. Having burning pain while urinating. Vaginal discharge. In some cases, there are no symptoms. How is this diagnosed? This condition is diagnosed based on your medical history and a physical exam. This will include a pelvic exam that checks the vaginal tissues. Though rare, you may also have other tests, including: A urine test. A test  that checks the acid balance in your vagina (acid balance test). How is this treated? Treatment for this condition depends on how severe your symptoms are. Treatment may include: Using an over-the-counter vaginal lubricant before sex. Using a long-acting vaginal moisturizer. Using low-dose estrogen for moderate to severe symptoms that do not respond to other treatments. Options include creams, tablets, and inserts (vaginal rings). Before you use a vaginal estrogen, tell your health care provider if you have a history of: Breast cancer. Endometrial cancer. Blood clots. If you are not sexually active and your symptoms are very mild, you may not need treatment. Follow these instructions at home: Medicines Take over-the-counter and prescription medicines only as told by your health care provider. Do not use herbal or alternative medicines unless your health care provider says that you can. Use over-the-counter creams, lubricants, or moisturizers for dryness only as told by your health care provider. General instructions If your atrophic vaginitis is caused by menopause, discuss all of your menopause symptoms and treatment options with your health care provider. Do not douche. Do not use products that can make your vagina dry. These include: Scented feminine sprays. Scented tampons. Scented soaps. Vaginal sex can help to improve blood flow and elasticity of vaginal tissue. If you choose to have sex and it hurts, try using a water-soluble lubricant or moisturizer right before having sex. Contact a health care provider if: Your discharge looks different than normal. Your vagina has an unusual smell. You have new symptoms. Your symptoms do not improve with treatment. Your symptoms get worse. Summary Atrophic vaginitis is a condition in   which the tissues that line the vagina become dry and thin. It is most common in women who have stopped having regular menstrual periods (are in  menopause). Treatment options include using vaginal lubricants and low-dose vaginal estrogen. Contact a health care provider if your vagina has an unusual smell, or if your symptoms get worse or do not improve after treatment. This information is not intended to replace advice given to you by your health care provider. Make sure you discuss any questions you have with your health care provider. Document Revised: 10/26/2019 Document Reviewed: 10/26/2019 Elsevier Patient Education  2022 Elsevier Inc.  

## 2021-03-13 LAB — URINALYSIS, COMPLETE W/RFL CULTURE
Crystals: NONE SEEN /HPF
Glucose, UA: NEGATIVE
Hgb urine dipstick: NEGATIVE
Leukocyte Esterase: NEGATIVE
Nitrites, Initial: NEGATIVE
Protein, ur: NEGATIVE
Specific Gravity, Urine: 1.025 (ref 1.001–1.035)
Yeast: NONE SEEN /HPF
pH: 6 (ref 5.0–8.0)

## 2021-03-13 LAB — URINE CULTURE
MICRO NUMBER:: 12577719
SPECIMEN QUALITY:: ADEQUATE

## 2021-03-13 LAB — CULTURE INDICATED

## 2021-03-14 ENCOUNTER — Telehealth: Payer: Self-pay | Admitting: Genetic Counselor

## 2021-03-14 NOTE — Telephone Encounter (Signed)
Scheduled appt per 11/1 referral. Pt is aware of appt date and time.

## 2021-03-19 ENCOUNTER — Telehealth: Payer: Self-pay | Admitting: Licensed Clinical Social Worker

## 2021-03-19 NOTE — Telephone Encounter (Signed)
Pt called in requesting to move her genetics appt to the beginning of the year due to insurance reasons. I moved her appt do January and gave her new appt date and time.

## 2021-03-20 ENCOUNTER — Other Ambulatory Visit: Payer: 59

## 2021-03-20 ENCOUNTER — Encounter: Payer: 59 | Admitting: Genetic Counselor

## 2021-04-08 ENCOUNTER — Other Ambulatory Visit: Payer: Self-pay | Admitting: Nurse Practitioner

## 2021-04-08 DIAGNOSIS — J302 Other seasonal allergic rhinitis: Secondary | ICD-10-CM

## 2021-04-08 NOTE — Telephone Encounter (Signed)
Chart supports rx refill Last ov: 01/23/2021 Last refill: 04/03/2020

## 2021-05-19 ENCOUNTER — Inpatient Hospital Stay: Payer: 59

## 2021-05-19 ENCOUNTER — Inpatient Hospital Stay: Payer: 59 | Admitting: Licensed Clinical Social Worker

## 2021-07-15 ENCOUNTER — Encounter: Payer: Self-pay | Admitting: Nurse Practitioner

## 2021-07-15 ENCOUNTER — Other Ambulatory Visit: Payer: Self-pay | Admitting: Nurse Practitioner

## 2021-07-15 DIAGNOSIS — G43009 Migraine without aura, not intractable, without status migrainosus: Secondary | ICD-10-CM

## 2021-07-15 NOTE — Telephone Encounter (Signed)
Refill request for  ?Sumatriptan 25 mg ?LR 08/27/20, #10, 0 rf ?LOV 01/23/21 ?FOV  none scheduled.   ? ?Please review and advise.  ?Thanks. Dm/cma ? ?

## 2021-07-16 MED ORDER — SUMATRIPTAN SUCCINATE 25 MG PO TABS: 25.0000 mg | ORAL_TABLET | ORAL | 0 refills | Status: DC | PRN

## 2021-07-20 ENCOUNTER — Other Ambulatory Visit: Payer: Self-pay | Admitting: Nurse Practitioner

## 2021-07-20 DIAGNOSIS — K219 Gastro-esophageal reflux disease without esophagitis: Secondary | ICD-10-CM

## 2021-07-21 NOTE — Telephone Encounter (Signed)
Rx sent in with no refills, informed patient to make f/u appointment.  ?

## 2021-08-18 ENCOUNTER — Encounter: Payer: Self-pay | Admitting: Nurse Practitioner

## 2021-08-18 ENCOUNTER — Ambulatory Visit: Payer: 59 | Admitting: Nurse Practitioner

## 2021-08-18 VITALS — BP 110/70 | HR 74 | Temp 97.6°F | Ht 65.0 in | Wt 183.4 lb

## 2021-08-18 DIAGNOSIS — F411 Generalized anxiety disorder: Secondary | ICD-10-CM

## 2021-08-18 DIAGNOSIS — E7800 Pure hypercholesterolemia, unspecified: Secondary | ICD-10-CM | POA: Diagnosis not present

## 2021-08-18 DIAGNOSIS — I10 Essential (primary) hypertension: Secondary | ICD-10-CM

## 2021-08-18 DIAGNOSIS — R739 Hyperglycemia, unspecified: Secondary | ICD-10-CM | POA: Diagnosis not present

## 2021-08-18 DIAGNOSIS — E559 Vitamin D deficiency, unspecified: Secondary | ICD-10-CM | POA: Diagnosis not present

## 2021-08-18 DIAGNOSIS — K219 Gastro-esophageal reflux disease without esophagitis: Secondary | ICD-10-CM

## 2021-08-18 LAB — VITAMIN D 25 HYDROXY (VIT D DEFICIENCY, FRACTURES): VITD: 39.89 ng/mL (ref 30.00–100.00)

## 2021-08-18 LAB — LIPID PANEL
Cholesterol: 211 mg/dL — ABNORMAL HIGH (ref 0–200)
HDL: 59.1 mg/dL (ref >39.00)
LDL Cholesterol: 134 mg/dL — ABNORMAL HIGH (ref 0–99)
NonHDL: 151.73
Total CHOL/HDL Ratio: 4
Triglycerides: 90 mg/dL (ref 0.0–149.0)
VLDL: 18 mg/dL (ref 0.0–40.0)

## 2021-08-18 LAB — BASIC METABOLIC PANEL
BUN: 14 mg/dL (ref 6–23)
CO2: 29 mEq/L (ref 19–32)
Calcium: 9.8 mg/dL (ref 8.4–10.5)
Chloride: 104 mEq/L (ref 96–112)
Creatinine, Ser: 0.88 mg/dL (ref 0.40–1.20)
GFR: 75.85 mL/min (ref >60.00)
Glucose, Bld: 96 mg/dL (ref 70–99)
Potassium: 4 mEq/L (ref 3.5–5.1)
Sodium: 140 mEq/L (ref 135–145)

## 2021-08-18 LAB — HEMOGLOBIN A1C: Hgb A1c MFr Bld: 6.3 % (ref 4.6–6.5)

## 2021-08-18 MED ORDER — OMEPRAZOLE 20 MG PO CPDR: 20.0000 mg | DELAYED_RELEASE_CAPSULE | Freq: Every day | ORAL | 3 refills | Status: DC

## 2021-08-18 MED ORDER — BUSPIRONE HCL 5 MG PO TABS: 5.0000 mg | ORAL_TABLET | Freq: Three times a day (TID) | ORAL | 5 refills | Status: DC

## 2021-08-18 MED ORDER — AMLODIPINE BESYLATE 10 MG PO TABS: 10.0000 mg | ORAL_TABLET | Freq: Every day | ORAL | 3 refills | Status: DC

## 2021-08-18 MED ORDER — LOSARTAN POTASSIUM 25 MG PO TABS: 25.0000 mg | ORAL_TABLET | Freq: Two times a day (BID) | ORAL | 3 refills | Status: DC

## 2021-08-18 NOTE — Patient Instructions (Addendum)
Try melatonin 5-'10mg'$  at bedtime. ? ?Go to lab for blood draw ? ?Maintain current medication ? ?Schedule appt for colonoscopy ?

## 2021-08-18 NOTE — Assessment & Plan Note (Signed)
BP at goal with losartan and amlodipine ?Exercise: gym 2x/week, cardio and weight training, 1hr each day. ?Diet: trying to make healthy choices ?Sleep:difficulty staying asleep, due to hot flashes, no improvement with sleep aid. ?No AM headache, no LE edema, no snoring. ?BP Readings from Last 3 Encounters:  ?08/18/21 110/70  ?03/11/21 (!) 142/80  ?01/23/21 116/70  ? ?Maintain DASH diet, 184mns per week of moderate intensity exercise, discuss use of HRT with GYN, try melatonin 5-'10mg'$  at hs, maintain current medications. ?Check BMP ?

## 2021-08-18 NOTE — Progress Notes (Signed)
? ?             Established Patient Visit ? ?Patient: Abigail [name]   DOB: July 29, 1970   51 y.o. Female  MRN: [id number] ?Visit Date: 08/18/2021 ? ?Subjective:  ?  ?Chief Complaint  ?Patient presents with  ? Follow-up  ?  6 month f/u on anxiety and HTN.  ?Pt states she knows she is due for Colonoscopy but has not scheduled due to transportation issues.   ? ?HPI ?HTN (hypertension) ?BP at goal with losartan and amlodipine ?Exercise: gym 2x/week, cardio and weight training, 1hr each day. ?Diet: trying to make healthy choices ?Sleep:difficulty staying asleep, due to hot flashes, no improvement with sleep aid. ?No AM headache, no LE edema, no snoring. ?BP Readings from Last 3 Encounters:  ?08/18/21 110/70  ?03/11/21 (!) 142/80  ?01/23/21 116/70  ? ?Maintain DASH diet, 173mns per week of moderate intensity exercise, discuss use of HRT with GYN, try melatonin 5-'10mg'$  at hs, maintain current medications. ?Check BMP ? ?Generalized anxiety disorder ?Has intermittent panic attacks triggered by high stress enviroment (confrontation with an inmates). Reports this does not incapacitate her ability to function at work. ?Denies need for CBT. ?PHQ/GAD screen: 0 ? ?Maintain buspar dose ?F/up in 3-638month? ? ?Wt Readings from Last 3 Encounters:  ?08/18/21 183 lb 6.4 oz (83.2 kg)  ?03/11/21 184 lb (83.5 kg)  ?01/23/21 183 lb 9.6 oz (83.3 kg)  ?  ?BP Readings from Last 3 Encounters:  ?08/18/21 110/70  ?03/11/21 (!) 142/80  ?01/23/21 116/70  ?  ?Reviewed medical, surgical, and social history today ? ?Medications: ?Outpatient Medications Prior to Visit  ?Medication Sig  ? amLODipine (NORVASC) 10 MG tablet Take 1 tablet (10 mg total) by mouth daily.  ? busPIRone (BUSPAR) 5 MG tablet Take 1 tablet (5 mg total) by mouth 3 (three) times daily.  ? diphenhydrAMINE (BENADRYL) 25 MG tablet Take 25 mg by mouth as needed.  ? fluticasone (FLONASE) 50 MCG/ACT nasal spray SHAKE LIQUID AND USE 1 SPRAY IN EACH NOSTRIL DAILY  ? loratadine (CLARITIN) 10 MG  tablet Take 10 mg by mouth daily.  ? losartan (COZAAR) 25 MG tablet Take 1 tablet (25 mg total) by mouth 2 (two) times daily.  ? omeprazole (PRILOSEC) 20 MG capsule TAKE 1 CAPSULE(20 MG) BY MOUTH DAILY  ? SUMAtriptan (IMITREX) 25 MG tablet Take 1-2 tablets (25-50 mg total) by mouth every 2 (two) hours as needed for migraine. May repeat in 2 hours if headache persists or recurs. No more than '100mg'$  in 24hrs  ? triamcinolone ointment (KENALOG) 0.5 % Apply 1 application topically 2 (two) times daily. You may use for 2 weeks at a time as needed.  ? Vitamin D, Ergocalciferol, 2000 units CAPS Take 1 capsule by mouth daily after breakfast.  ? ?No facility-administered medications prior to visit.  ? ?Reviewed past medical and social history.  ? ?ROS per HPI above ? ? ?   ?Objective:  ?BP 110/70 (BP Location: Left Arm, Patient Position: Sitting, Cuff Size: Normal)   Pulse 74   Temp 97.6 ?F (36.4 ?C) (Temporal)   Ht '5\' 5"'$  (1.651 m)   Wt 183 lb 6.4 oz (83.2 kg)   LMP 01/18/2012 (Exact Date)   SpO2 98%   BMI 30.52 kg/m?  ? ?  ? ?Physical Exam ?Cardiovascular:  ?   Rate and Rhythm: Normal rate and regular rhythm.  ?   Pulses: Normal pulses.  ?   Heart sounds: Normal heart sounds.  ?Pulmonary:  ?  Effort: Pulmonary effort is normal.  ?   Breath sounds: Normal breath sounds.  ?Musculoskeletal:  ?   Right lower leg: No edema.  ?   Left lower leg: No edema.  ?Neurological:  ?   Mental Status: She is alert and oriented to person, place, and time.  ?Psychiatric:     ?   Mood and Affect: Mood normal.     ?   Behavior: Behavior normal.     ?   Thought Content: Thought content normal.     ?   Judgment: Judgment normal.  ?  ?No results found for any visits on 08/18/21. ?   ?Assessment & Plan:  ?  ?Problem List Items Addressed This Visit   ? ?  ? Cardiovascular and Mediastinum  ? HTN (hypertension) - Primary  ?  BP at goal with losartan and amlodipine ?Exercise: gym 2x/week, cardio and weight training, 1hr each day. ?Diet: trying to  make healthy choices ?Sleep:difficulty staying asleep, due to hot flashes, no improvement with sleep aid. ?No AM headache, no LE edema, no snoring. ?BP Readings from Last 3 Encounters:  ?08/18/21 110/70  ?03/11/21 (!) 142/80  ?01/23/21 116/70  ? ?Maintain DASH diet, 174mns per week of moderate intensity exercise, discuss use of HRT with GYN, try melatonin 5-'10mg'$  at hs, maintain current medications. ?Check BMP ?  ?  ? Relevant Orders  ? Basic metabolic panel  ?  ? Other  ? Generalized anxiety disorder  ?  Has intermittent panic attacks triggered by high stress enviroment (confrontation with an inmates). Reports this does not incapacitate her ability to function at work. ?Denies need for CBT. ?PHQ/GAD screen: 0 ? ?Maintain buspar dose ?F/up in 3-646month?  ?  ? Hyperglycemia  ? Relevant Orders  ? Hemoglobin A1c  ? Vitamin D insufficiency  ? Relevant Orders  ? VITAMIN D 25 Hydroxy (Vit-D Deficiency, Fractures)  ? ?Other Visit Diagnoses   ? ? Elevated LDL cholesterol level      ? Relevant Orders  ? Lipid panel  ? ?  ? ?Return in about 6 months (around 02/17/2022) for CPE (fasting). ? ?  ? ?ChWilfred LacyNP ? ? ?

## 2021-08-18 NOTE — Assessment & Plan Note (Signed)
Has intermittent panic attacks triggered by high stress enviroment (confrontation with an inmates). Reports this does not incapacitate her ability to function at work. ?Denies need for CBT. ?PHQ/GAD screen: 0 ? ?Maintain buspar dose ?F/up in 3-83month ?

## 2021-10-09 NOTE — Progress Notes (Signed)
51 y.o. [name] Married [place] American female here for annual exam.    Having hot flashes every 20 minutes.  Having night sweats and not sleeping well.  Symptoms come and go.  Tried Amberen.  Wants to check her hormones.   Decreased sex drive and having vaginal dryness.   Labs were consistent with perimenopause.   Takes Buspar one to two times a day.  PCP is prescribing.   Patient did not have genetic testing.  Her daughter had testing which was negative.   PCP:  Abigail Lacy, NP   Patient's last menstrual period was 01/18/2012 (exact date).           Sexually active: Yes.    The current method of family planning is status post hysterectomy.    Exercising: No.  The patient does not participate in regular exercise at present. Smoker:  no  Health Maintenance: Pap:  07/26/20 Neg:Neg HR HPV, 07/28/16 - neg:neg HR HPV. History of abnormal Pap:  yes, 1992 Hx of LEEP MMG:  11-28-20 Neg/BiRads1 Colonoscopy:  NEVER.  She had preliminary consultation but not colonoscopy.  BMD:   n/a  Result  n/a TDaP:  04-10-14 Gardasil:   n/a HIV: 03-31-19 NR Hep C: 03-31-19 Neg Screening Labs:  PCP   reports that she has never smoked. She has never used smokeless tobacco. She reports current alcohol use of about 2.0 standard drinks per week. She reports that she does not use drugs.  Past Medical History:  Diagnosis Date   Anemia    iron supp   Anxiety    COVID 05/2020   Depression    Fibroids    GERD (gastroesophageal reflux disease)    omeprazole   Headache(784.0)    Hypertension 2012   Maxide   S/P LEEP 1992   Urticaria     Past Surgical History:  Procedure Laterality Date   Inicision and drainage of thrombosed hemorrhoid  04/06/2018   LEEP  1998   Abnormal pap   SUPRACERVICAL ABDOMINAL HYSTERECTOMY  01/18/2012   Procedure: HYSTERECTOMY SUPRACERVICAL ABDOMINAL with LSO;  Surgeon: Osborne Oman, MD;  Location: [facility];  Service: Gynecology;  Laterality: N/A;   TUBAL  LIGATION  1997   WISDOM TOOTH EXTRACTION      Current Outpatient Medications  Medication Sig Dispense Refill   amLODipine (NORVASC) 10 MG tablet Take 1 tablet (10 mg total) by mouth daily. 90 tablet 3   busPIRone (BUSPAR) 5 MG tablet Take 1 tablet (5 mg total) by mouth 3 (three) times daily. 90 tablet 5   ciprofloxacin (CIPRO) 500 MG tablet Take 500 mg by mouth 2 (two) times daily.     diphenhydrAMINE (BENADRYL) 25 MG tablet Take 25 mg by mouth as needed.     fluconazole (DIFLUCAN) 150 MG tablet Take 150 mg by mouth every 3 (three) days.     fluticasone (FLONASE) 50 MCG/ACT nasal spray SHAKE LIQUID AND USE 1 SPRAY IN EACH NOSTRIL DAILY 16 g 5   loratadine (CLARITIN) 10 MG tablet Take 10 mg by mouth daily.     losartan (COZAAR) 25 MG tablet Take 1 tablet (25 mg total) by mouth 2 (two) times daily. 180 tablet 3   omeprazole (PRILOSEC) 20 MG capsule Take 1 capsule (20 mg total) by mouth daily. 90 capsule 3   phenazopyridine (PYRIDIUM) 200 MG tablet Take 200 mg by mouth 3 (three) times daily.     SUMAtriptan (IMITREX) 25 MG tablet Take 1-2 tablets (25-50 mg total) by mouth every  2 (two) hours as needed for migraine. May repeat in 2 hours if headache persists or recurs. No more than '100mg'$  in 24hrs 10 tablet 0   triamcinolone ointment (KENALOG) 0.5 % Apply 1 application topically 2 (two) times daily. You may use for 2 weeks at a time as needed. 30 g 0   Vitamin D, Ergocalciferol, 2000 units CAPS Take 1 capsule by mouth daily after breakfast. 90 capsule 1   No current facility-administered medications for this visit.    Family History  Problem Relation Age of Onset   Hypertension Mother    Arthritis Mother    Pulmonary fibrosis Mother    Hypertension Father    Depression Father    Diabetes Father    Alcohol abuse Father    Colonic polyp Father    Diabetes Sister    Congestive Heart Failure Sister    Hypertension Sister    Diabetes Brother    Breast cancer Paternal Grandmother    Heart  attack Paternal Grandmother    Breast cancer Daughter    Allergic rhinitis Neg Hx    Angioedema Neg Hx    Asthma Neg Hx    Eczema Neg Hx    Immunodeficiency Neg Hx    Urticaria Neg Hx     Review of Systems  All other systems reviewed and are negative.  Exam:   BP 128/84   Pulse 67   Ht '5\' 5"'$  (1.651 m)   Wt 184 lb (83.5 kg)   LMP 01/18/2012 (Exact Date)   SpO2 97%   BMI 30.62 kg/m     General appearance: alert, cooperative and appears stated age Head: normocephalic, without obvious abnormality, atraumatic Neck: no adenopathy, supple, symmetrical, trachea midline and thyroid normal to inspection and palpation Lungs: clear to auscultation bilaterally Breasts: normal appearance, no masses or tenderness, No nipple retraction or dimpling, No nipple discharge or bleeding, No axillary adenopathy Heart: regular rate and rhythm Abdomen: soft, non-tender; no masses, no organomegaly Extremities: extremities normal, atraumatic, no cyanosis or edema Skin: skin color, texture, turgor normal. No rashes or lesions Lymph nodes: cervical, supraclavicular, and axillary nodes normal. Neurologic: grossly normal  Pelvic: External genitalia:  no lesions              No abnormal inguinal nodes palpated.              Urethra:  normal appearing urethra with no masses, tenderness or lesions              Bartholins and Skenes: normal                 Vagina: normal appearing vagina with normal color and discharge, no lesions              Cervix: no lesions              Pap taken: no Bimanual Exam:  Uterus: absent              Adnexa: no mass, fullness, tenderness              Rectal exam: yes.  Confirms.              Anus:  normal sphincter tone, no lesions  Chaperone was present for exam:  Estill Bamberg, CMA  Assessment:   Well woman visit with gynecologic exam. Status post supracervical hysterectomy, LSO, right salpingectomy for fibroids.  Right ovary remains. Hx LEEP 1998. Daughter with breast  cancer, age 106 yo.  Menopausal symptoms.  Vaginal atrophy.   Plan: Mammogram screening discussed.  Self breast awareness reviewed. Pap and HR HPV as above. Guidelines for Calcium, Vitamin D, regular exercise program including cardiovascular and weight bearing exercise. FSH and estradiol.  Start Effexor XR 37.5 mg daily.  Risks and benefits reviewed.  Start Vagifem.  Instructed in use.  Potential effect on breast cancer discussed.  Fu in 6 weeks and prn.  Follow up annually and prn.   After visit summary provided.

## 2021-10-13 ENCOUNTER — Encounter: Payer: Self-pay | Admitting: Obstetrics and Gynecology

## 2021-10-13 ENCOUNTER — Ambulatory Visit (INDEPENDENT_AMBULATORY_CARE_PROVIDER_SITE_OTHER): Payer: 59 | Admitting: Obstetrics and Gynecology

## 2021-10-13 VITALS — BP 128/84 | HR 67 | Ht 65.0 in | Wt 184.0 lb

## 2021-10-13 DIAGNOSIS — N951 Menopausal and female climacteric states: Secondary | ICD-10-CM | POA: Diagnosis not present

## 2021-10-13 DIAGNOSIS — Z01419 Encounter for gynecological examination (general) (routine) without abnormal findings: Secondary | ICD-10-CM | POA: Diagnosis not present

## 2021-10-13 MED ORDER — VENLAFAXINE HCL ER 37.5 MG PO CP24: 37.5000 mg | ORAL_CAPSULE | Freq: Every day | ORAL | 1 refills | Status: DC

## 2021-10-13 MED ORDER — ESTRADIOL 10 MCG VA TABS: 1.0000 | ORAL_TABLET | VAGINAL | 3 refills | Status: DC

## 2021-10-13 NOTE — Patient Instructions (Signed)

## 2021-10-14 LAB — ESTRADIOL: Estradiol: 49 pg/mL

## 2021-10-14 LAB — FOLLICLE STIMULATING HORMONE: FSH: 58.3 m[IU]/mL

## 2021-11-21 ENCOUNTER — Encounter: Payer: Self-pay | Admitting: Obstetrics and Gynecology

## 2021-11-26 ENCOUNTER — Ambulatory Visit: Payer: 59 | Admitting: Obstetrics and Gynecology

## 2021-11-30 ENCOUNTER — Encounter: Payer: Self-pay | Admitting: Nurse Practitioner

## 2021-11-30 ENCOUNTER — Other Ambulatory Visit: Payer: Self-pay | Admitting: Nurse Practitioner

## 2021-11-30 DIAGNOSIS — G43009 Migraine without aura, not intractable, without status migrainosus: Secondary | ICD-10-CM

## 2021-12-01 MED ORDER — SUMATRIPTAN SUCCINATE 25 MG PO TABS: 25.0000 mg | ORAL_TABLET | ORAL | 0 refills | Status: DC | PRN

## 2021-12-01 NOTE — Telephone Encounter (Signed)
Chart supports Rx Last OV: 08/2021 Next OV: not scheduled

## 2022-01-13 ENCOUNTER — Other Ambulatory Visit: Payer: Self-pay | Admitting: Nurse Practitioner

## 2022-01-13 ENCOUNTER — Encounter: Payer: Self-pay | Admitting: Nurse Practitioner

## 2022-01-13 DIAGNOSIS — F411 Generalized anxiety disorder: Secondary | ICD-10-CM

## 2022-01-13 DIAGNOSIS — I10 Essential (primary) hypertension: Secondary | ICD-10-CM

## 2022-01-13 NOTE — Telephone Encounter (Signed)
Chart supports Rx Last OV: 08/2021 Next OV: not scheduled, pt needs appt for further refills

## 2022-02-05 ENCOUNTER — Other Ambulatory Visit: Payer: Self-pay | Admitting: Nurse Practitioner

## 2022-02-05 DIAGNOSIS — Z1231 Encounter for screening mammogram for malignant neoplasm of breast: Secondary | ICD-10-CM

## 2022-02-16 ENCOUNTER — Encounter: Payer: Self-pay | Admitting: Nurse Practitioner

## 2022-02-16 ENCOUNTER — Ambulatory Visit: Payer: 59 | Admitting: Nurse Practitioner

## 2022-02-16 ENCOUNTER — Other Ambulatory Visit: Payer: Self-pay | Admitting: Nurse Practitioner

## 2022-02-16 VITALS — BP 120/80 | HR 78 | Temp 97.0°F | Ht 65.0 in | Wt 186.6 lb

## 2022-02-16 DIAGNOSIS — E1165 Type 2 diabetes mellitus with hyperglycemia: Secondary | ICD-10-CM | POA: Diagnosis not present

## 2022-02-16 DIAGNOSIS — F411 Generalized anxiety disorder: Secondary | ICD-10-CM

## 2022-02-16 DIAGNOSIS — Z23 Encounter for immunization: Secondary | ICD-10-CM

## 2022-02-16 DIAGNOSIS — L02422 Furuncle of left axilla: Secondary | ICD-10-CM

## 2022-02-16 DIAGNOSIS — I10 Essential (primary) hypertension: Secondary | ICD-10-CM | POA: Diagnosis not present

## 2022-02-16 LAB — COMPREHENSIVE METABOLIC PANEL
ALT: 14 U/L (ref 0–35)
AST: 22 U/L (ref 0–37)
Albumin: 3.8 g/dL (ref 3.5–5.2)
Alkaline Phosphatase: 57 U/L (ref 39–117)
BUN: 15 mg/dL (ref 6–23)
CO2: 29 mEq/L (ref 19–32)
Calcium: 9 mg/dL (ref 8.4–10.5)
Chloride: 103 mEq/L (ref 96–112)
Creatinine, Ser: 0.77 mg/dL (ref 0.40–1.20)
GFR: 88.72 mL/min (ref >60.00)
Glucose, Bld: 102 mg/dL — ABNORMAL HIGH (ref 70–99)
Potassium: 3.6 mEq/L (ref 3.5–5.1)
Sodium: 139 mEq/L (ref 135–145)
Total Bilirubin: 0.4 mg/dL (ref 0.2–1.2)
Total Protein: 7.1 g/dL (ref 6.0–8.3)

## 2022-02-16 LAB — HEMOGLOBIN A1C: Hgb A1c MFr Bld: 6.5 % (ref 4.6–6.5)

## 2022-02-16 MED ORDER — SULFAMETHOXAZOLE-TRIMETHOPRIM 800-160 MG PO TABS: 1.0000 | ORAL_TABLET | Freq: Two times a day (BID) | ORAL | 0 refills | Status: DC

## 2022-02-16 MED ORDER — LOSARTAN POTASSIUM 25 MG PO TABS: ORAL_TABLET | ORAL | 3 refills | Status: DC

## 2022-02-16 MED ORDER — AMLODIPINE BESYLATE 10 MG PO TABS: 10.0000 mg | ORAL_TABLET | Freq: Every day | ORAL | 3 refills | Status: DC

## 2022-02-16 NOTE — Assessment & Plan Note (Signed)
Uncontrolled with buspar She remains hesitant about trying another medication. She declined referral to psychology.

## 2022-02-16 NOTE — Assessment & Plan Note (Addendum)
Repeat hgbA1c: 6.5% Recommend referral to nutritionist F/up in 23month

## 2022-02-16 NOTE — Patient Instructions (Signed)
Go to lab Schedule appt with GI for colonoscopy: [phone] Need to maintain DASH diet and daily exercise. Start effexor as prescribed by GYN Apply warm compress 2-3x/day, x 25mns each

## 2022-02-16 NOTE — Progress Notes (Signed)
Established Patient Visit  Patient: Abigail [name]   DOB: [birth year]   51 y.o. Female  MRN: [id number] Visit Date: 02/16/2022  Subjective:    Chief Complaint  Patient presents with   Follow-up    Follow up med check  c/o boil under left armpit x 2 days. Would like flu vaccine   Rash This is a recurrent problem. The current episode started 1 to 4 weeks ago. The problem has been waxing and waning since onset. The affected locations include the left axilla. The rash is characterized by pain, redness and swelling. She was exposed to nothing. Pertinent negatives include no fatigue, fever or joint pain. Past treatments include nothing.   HTN (hypertension) Stable BP with amlodipine and losartan BP Readings from Last 3 Encounters:  02/16/22 120/80  10/13/21 128/84  08/18/21 110/70   Maintain med dose  Generalized anxiety disorder Uncontrolled with buspar She remains hesitant about trying another medication. She declined referral to psychology.  Hyperglycemia Repeat hgbA1c  Reviewed medical, surgical, and social history today  Medications: Outpatient Medications Prior to Visit  Medication Sig   busPIRone (BUSPAR) 5 MG tablet TAKE 1 TABLET(5 MG) BY MOUTH THREE TIMES DAILY   diphenhydrAMINE (BENADRYL) 25 MG tablet Take 25 mg by mouth as needed.   fluticasone (FLONASE) 50 MCG/ACT nasal spray SHAKE LIQUID AND USE 1 SPRAY IN EACH NOSTRIL DAILY   loratadine (CLARITIN) 10 MG tablet Take 10 mg by mouth daily.   omeprazole (PRILOSEC) 20 MG capsule Take 1 capsule (20 mg total) by mouth daily.   SUMAtriptan (IMITREX) 25 MG tablet Take 1-2 tablets (25-50 mg total) by mouth every 2 (two) hours as needed for migraine. May repeat in 2 hours if headache persists or recurs. No more than '100mg'$  in 24hrs   triamcinolone ointment (KENALOG) 0.5 % Apply 1 application topically 2 (two) times daily. You may use for 2 weeks at a time as needed.   Vitamin D, Ergocalciferol, 2000 units CAPS Take  1 capsule by mouth daily after breakfast.   [DISCONTINUED] amLODipine (NORVASC) 10 MG tablet Take 1 tablet (10 mg total) by mouth daily.   [DISCONTINUED] losartan (COZAAR) 25 MG tablet TAKE 1 TABLET(25 MG) BY MOUTH DAILY   fluconazole (DIFLUCAN) 150 MG tablet Take 150 mg by mouth every 3 (three) days. (Patient not taking: Reported on 02/16/2022)   [DISCONTINUED] ciprofloxacin (CIPRO) 500 MG tablet Take 500 mg by mouth 2 (two) times daily.   [DISCONTINUED] Estradiol (VAGIFEM) 10 MCG TABS vaginal tablet Place 1 tablet (10 mcg total) vaginally 2 (two) times a week. Use every night before bed for two weeks when you first begin this medicine, then after the first two weeks, begin using it twice a week.   [DISCONTINUED] phenazopyridine (PYRIDIUM) 200 MG tablet Take 200 mg by mouth 3 (three) times daily.   [DISCONTINUED] venlafaxine XR (EFFEXOR XR) 37.5 MG 24 hr capsule Take 1 capsule (37.5 mg total) by mouth daily.   No facility-administered medications prior to visit.   Reviewed past medical and social history.   ROS per HPI above      Objective:  BP 120/80 (BP Location: Left Arm, Patient Position: Sitting, Cuff Size: Normal)   Pulse 78   Temp (!) 97 F (36.1 C) (Temporal)   Ht '5\' 5"'$  (1.651 m)   Wt 186 lb 9.6 oz (84.6 kg)   LMP 01/18/2012 (Exact Date)   SpO2 94%   BMI  31.05 kg/m      Physical Exam Vitals and nursing note reviewed.  Cardiovascular:     Rate and Rhythm: Normal rate.     Pulses: Normal pulses.  Pulmonary:     Effort: Pulmonary effort is normal.  Lymphadenopathy:     Upper Body:     Left upper body: No axillary adenopathy.  Skin:    Findings: Rash present. Rash is pustular.  Neurological:     Mental Status: She is alert and oriented to person, place, and time.  Psychiatric:        Mood and Affect: Mood normal.        Behavior: Behavior normal.        Thought Content: Thought content normal.     No results found for any visits on 02/16/22.    Assessment &  Plan:    Problem List Items Addressed This Visit       Cardiovascular and Mediastinum   HTN (hypertension)    Stable BP with amlodipine and losartan BP Readings from Last 3 Encounters:  02/16/22 120/80  10/13/21 128/84  08/18/21 110/70   Maintain med dose      Relevant Medications   losartan (COZAAR) 25 MG tablet   amLODipine (NORVASC) 10 MG tablet   Other Relevant Orders   Comprehensive metabolic panel     Other   Generalized anxiety disorder    Uncontrolled with buspar She remains hesitant about trying another medication. She declined referral to psychology.      Hyperglycemia - Primary    Repeat hgbA1c      Relevant Orders   Hemoglobin A1c   Other Visit Diagnoses     Furuncle of left axilla       Relevant Medications   sulfamethoxazole-trimethoprim (BACTRIM DS) 800-160 MG tablet   Essential hypertension       Relevant Medications   losartan (COZAAR) 25 MG tablet   amLODipine (NORVASC) 10 MG tablet   Influenza vaccine needed       Relevant Orders   Flu Vaccine QUAD 6+ mos PF IM (Fluarix Quad PF) (Completed)      Return in about 6 months (around 08/18/2022) for CPE (fasting).     Wilfred Lacy, NP

## 2022-02-16 NOTE — Assessment & Plan Note (Signed)
Stable BP with amlodipine and losartan BP Readings from Last 3 Encounters:  02/16/22 120/80  10/13/21 128/84  08/18/21 110/70   Maintain med dose

## 2022-02-19 ENCOUNTER — Other Ambulatory Visit: Payer: Self-pay

## 2022-02-19 DIAGNOSIS — F411 Generalized anxiety disorder: Secondary | ICD-10-CM

## 2022-02-19 MED ORDER — BUSPIRONE HCL 5 MG PO TABS: ORAL_TABLET | ORAL | 0 refills | Status: DC

## 2022-03-05 ENCOUNTER — Ambulatory Visit: Payer: 59

## 2022-03-06 ENCOUNTER — Ambulatory Visit: Payer: 59

## 2022-03-10 ENCOUNTER — Ambulatory Visit
Admission: RE | Admit: 2022-03-10 | Discharge: 2022-03-10 | Disposition: A | Discharge status: Home / Self Care | Payer: 59 | Source: Ambulatory Visit | Attending: Nurse Practitioner | Admitting: Nurse Practitioner

## 2022-03-10 DIAGNOSIS — Z1231 Encounter for screening mammogram for malignant neoplasm of breast: Secondary | ICD-10-CM

## 2022-03-13 ENCOUNTER — Other Ambulatory Visit: Payer: Self-pay | Admitting: Nurse Practitioner

## 2022-03-13 ENCOUNTER — Encounter: Payer: Self-pay | Admitting: Nurse Practitioner

## 2022-03-13 ENCOUNTER — Ambulatory Visit: Payer: 59 | Admitting: Nurse Practitioner

## 2022-03-13 VITALS — BP 120/88 | HR 82 | Temp 97.1°F | Wt 187.4 lb

## 2022-03-13 DIAGNOSIS — R0781 Pleurodynia: Secondary | ICD-10-CM

## 2022-03-13 DIAGNOSIS — R1011 Right upper quadrant pain: Secondary | ICD-10-CM

## 2022-03-13 DIAGNOSIS — E119 Type 2 diabetes mellitus without complications: Secondary | ICD-10-CM

## 2022-03-13 DIAGNOSIS — R928 Other abnormal and inconclusive findings on diagnostic imaging of breast: Secondary | ICD-10-CM

## 2022-03-13 LAB — COMPREHENSIVE METABOLIC PANEL
ALT: 15 U/L (ref 0–35)
AST: 20 U/L (ref 0–37)
Albumin: 4.5 g/dL (ref 3.5–5.2)
Alkaline Phosphatase: 62 U/L (ref 39–117)
BUN: 17 mg/dL (ref 6–23)
CO2: 32 mEq/L (ref 19–32)
Calcium: 9.5 mg/dL (ref 8.4–10.5)
Chloride: 102 mEq/L (ref 96–112)
Creatinine, Ser: 0.8 mg/dL (ref 0.40–1.20)
GFR: 84.7 mL/min (ref >60.00)
Glucose, Bld: 93 mg/dL (ref 70–99)
Potassium: 4 mEq/L (ref 3.5–5.1)
Sodium: 139 mEq/L (ref 135–145)
Total Bilirubin: 0.3 mg/dL (ref 0.2–1.2)
Total Protein: 7.6 g/dL (ref 6.0–8.3)

## 2022-03-13 LAB — POCT URINALYSIS DIPSTICK
Bilirubin, UA: POSITIVE
Blood, UA: NEGATIVE
Glucose, UA: NEGATIVE
Ketones, UA: NEGATIVE
Leukocytes, UA: NEGATIVE
Nitrite, UA: NEGATIVE
Protein, UA: POSITIVE — AB
Spec Grav, UA: 1.025 (ref 1.010–1.025)
pH, UA: 6 (ref 5.0–8.0)

## 2022-03-13 LAB — CBC WITH DIFFERENTIAL/PLATELET
Basophils Absolute: 0.1 10*3/uL (ref 0.0–0.1)
Basophils Relative: 1 % (ref 0.0–3.0)
Eosinophils Absolute: 0.2 10*3/uL (ref 0.0–0.7)
Eosinophils Relative: 3.1 % (ref 0.0–5.0)
HCT: 38.5 % (ref 36.0–46.0)
Hemoglobin: 12.7 g/dL (ref 12.0–15.0)
Lymphocytes Relative: 42 % (ref 12.0–46.0)
Lymphs Abs: 2.7 10*3/uL (ref 0.7–4.0)
MCHC: 33 g/dL (ref 30.0–36.0)
MCV: 92.5 fl (ref 78.0–100.0)
Monocytes Absolute: 0.5 10*3/uL (ref 0.1–1.0)
Monocytes Relative: 7.7 % (ref 3.0–12.0)
Neutro Abs: 2.9 10*3/uL (ref 1.4–7.7)
Neutrophils Relative %: 46.2 % (ref 43.0–77.0)
Platelets: 303 10*3/uL (ref 150.0–400.0)
RBC: 4.16 Mil/uL (ref 3.87–5.11)
RDW: 13.5 % (ref 11.5–15.5)
WBC: 6.4 10*3/uL (ref 4.0–10.5)

## 2022-03-13 LAB — MICROALBUMIN / CREATININE URINE RATIO
Creatinine,U: 199 mg/dL
Microalb Creat Ratio: 0.9 mg/g (ref 0.0–30.0)
Microalb, Ur: 1.9 mg/dL (ref 0.0–1.9)

## 2022-03-13 MED ORDER — CYCLOBENZAPRINE HCL 10 MG PO TABS: 10.0000 mg | ORAL_TABLET | Freq: Three times a day (TID) | ORAL | 0 refills | Status: DC | PRN

## 2022-03-13 NOTE — Assessment & Plan Note (Signed)
Recent diagnosis a few weeks ago. Will check urine microalbumin since checking u/a today. Follow-up with pcp as scheduled.

## 2022-03-13 NOTE — Patient Instructions (Signed)
It was great to see you!  Start flexeril as needed for muscle tightness/spasms/pain. This may make you sleepy.   Keep taking and alternating tylenol and aleve.   I have attached some stretches to do daily.   We are checking your labs today and will let you know the results via mychart/phone.   Let's follow-up if your symptoms worsen or don't improve  Take care,  Vance Peper, NP

## 2022-03-13 NOTE — Progress Notes (Signed)
Acute Office Visit  Subjective:     Patient ID: Abigail [name], female    DOB: [birth year], 51 y.o.   MRN: [id number]  Chief Complaint  Patient presents with   Acute Visit    Pt c/o RT SIDE/BLOWER BACK/ HIP pain for several days. Pt states the pain is dull but persistent. Pain level (3-4)    HPI Patient is in today for right hip, back pain for about 5 days. She states that she went to the gym and did some dips and is not sure if this is what caused it. She describes the pain in her right upper rib area. She describes it as a dull, achy pain. The pain radiates from her right side down to her right hip. She feels weak in that right side. She also notices some nausea. She states that laying down make it's better. She tried aleve without relief. Moving and getting up makes the pain worse. She denies fevers, chest pain, and shortness of breath, dysuria, and urinary frequency. She states that the other night when she was sleeping, she noticed some spasms in the right side.   ROS See pertinent positives and negatives per HPI.     Objective:    BP 120/88   Pulse 82   Temp (!) 97.1 F (36.2 C) (Temporal)   Wt 187 lb 6.4 oz (85 kg)   LMP 01/18/2012 (Exact Date)   SpO2 94%   BMI 31.18 kg/m    Physical Exam Vitals and nursing note reviewed.  Constitutional:      General: She is not in acute distress.    Appearance: Normal appearance.  HENT:     Head: Normocephalic.  Eyes:     Conjunctiva/sclera: Conjunctivae normal.  Cardiovascular:     Rate and Rhythm: Normal rate and regular rhythm.     Pulses: Normal pulses.     Heart sounds: Normal heart sounds.  Pulmonary:     Effort: Pulmonary effort is normal.     Breath sounds: Normal breath sounds.  Abdominal:     General: There is no distension.     Palpations: Abdomen is soft.     Tenderness: There is abdominal tenderness (RUQ). There is no guarding.  Musculoskeletal:        General: Tenderness (To right ribs while sitting, not  while laying down) present. No swelling. Normal range of motion.     Cervical back: Normal range of motion.  Skin:    General: Skin is warm.  Neurological:     General: No focal deficit present.     Mental Status: She is alert and oriented to person, place, and time.  Psychiatric:        Mood and Affect: Mood normal.        Behavior: Behavior normal.        Thought Content: Thought content normal.        Judgment: Judgment normal.     Results for orders placed or performed in visit on 03/13/22  CBC with Differential/Platelet  Result Value Ref Range   WBC 6.4 4.0 - 10.5 K/uL   RBC 4.16 3.87 - 5.11 Mil/uL   Hemoglobin 12.7 12.0 - 15.0 g/dL   HCT 38.5 36.0 - 46.0 %   MCV 92.5 78.0 - 100.0 fl   MCHC 33.0 30.0 - 36.0 g/dL   RDW 13.5 11.5 - 15.5 %   Platelets 303.0 150.0 - 400.0 K/uL   Neutrophils Relative % 46.2 43.0 - 77.0 %  Lymphocytes Relative 42.0 12.0 - 46.0 %   Monocytes Relative 7.7 3.0 - 12.0 %   Eosinophils Relative 3.1 0.0 - 5.0 %   Basophils Relative 1.0 0.0 - 3.0 %   Neutro Abs 2.9 1.4 - 7.7 K/uL   Lymphs Abs 2.7 0.7 - 4.0 K/uL   Monocytes Absolute 0.5 0.1 - 1.0 K/uL   Eosinophils Absolute 0.2 0.0 - 0.7 K/uL   Basophils Absolute 0.1 0.0 - 0.1 K/uL  Comprehensive metabolic panel  Result Value Ref Range   Sodium 139 135 - 145 mEq/L   Potassium 4.0 3.5 - 5.1 mEq/L   Chloride 102 96 - 112 mEq/L   CO2 32 19 - 32 mEq/L   Glucose, Bld 93 70 - 99 mg/dL   BUN 17 6 - 23 mg/dL   Creatinine, Ser 0.80 0.40 - 1.20 mg/dL   Total Bilirubin 0.3 0.2 - 1.2 mg/dL   Alkaline Phosphatase 62 39 - 117 U/L   AST 20 0 - 37 U/L   ALT 15 0 - 35 U/L   Total Protein 7.6 6.0 - 8.3 g/dL   Albumin 4.5 3.5 - 5.2 g/dL   GFR 84.70 >60.00 mL/min   Calcium 9.5 8.4 - 10.5 mg/dL  Microalbumin / creatinine urine ratio  Result Value Ref Range   Microalb, Ur 1.9 0.0 - 1.9 mg/dL   Creatinine,U 199.0 mg/dL   Microalb Creat Ratio 0.9 0.0 - 30.0 mg/g  POCT urinalysis dipstick  Result Value Ref  Range   Color, UA yellow    Clarity, UA clear    Glucose, UA Negative Negative   Bilirubin, UA Pos    Ketones, UA NEG    Spec Grav, UA 1.025 1.010 - 1.025   Blood, UA NEG    pH, UA 6.0 5.0 - 8.0   Protein, UA Positive (A) Negative   Nitrite, UA neg    Leukocytes, UA Negative Negative   Appearance     Odor          Assessment & Plan:   Problem List Items Addressed This Visit       Endocrine   DM (diabetes mellitus) ([place])    Recent diagnosis a few weeks ago. Will check urine microalbumin since checking u/a today. Follow-up with pcp as scheduled.       Relevant Orders   Microalbumin / creatinine urine ratio (Completed)   Other Visit Diagnoses     Rib pain on right side    -  Primary   Most likely musculoskeletal with recently starting exercising. Continue aleve/tylenol prn. Stretches given to do daily. Flexeril prn muscle tightness/spasms.   RUQ pain       Slight tenderness to palpation. Will check CMP, CBC, and u/a. See plan above for rib pain. F/U if not improving.   Relevant Orders   CBC with Differential/Platelet (Completed)   Comprehensive metabolic panel (Completed)   POCT urinalysis dipstick (Completed)       Meds ordered this encounter  Medications   cyclobenzaprine (FLEXERIL) 10 MG tablet    Sig: Take 1 tablet (10 mg total) by mouth 3 (three) times daily as needed for muscle spasms.    Dispense:  30 tablet    Refill:  0    Return if symptoms worsen or fail to improve.  Charyl Dancer, NP

## 2022-03-23 ENCOUNTER — Ambulatory Visit
Admission: RE | Admit: 2022-03-23 | Discharge: 2022-03-23 | Disposition: A | Discharge status: Home / Self Care | Payer: 59 | Source: Ambulatory Visit | Attending: Nurse Practitioner | Admitting: Nurse Practitioner

## 2022-03-23 DIAGNOSIS — R928 Other abnormal and inconclusive findings on diagnostic imaging of breast: Secondary | ICD-10-CM

## 2022-03-30 ENCOUNTER — Ambulatory Visit (HOSPITAL_COMMUNITY): Payer: Self-pay | Admitting: Psychiatry

## 2022-03-30 ENCOUNTER — Encounter: Payer: Self-pay | Admitting: Nurse Practitioner

## 2022-03-30 DIAGNOSIS — F411 Generalized anxiety disorder: Secondary | ICD-10-CM

## 2022-03-30 MED ORDER — BUSPIRONE HCL 5 MG PO TABS: 5.0000 mg | ORAL_TABLET | Freq: Three times a day (TID) | ORAL | 11 refills | Status: DC

## 2022-03-30 NOTE — Assessment & Plan Note (Signed)
Unable to tolerate effexor (headache, generalized ache) Maintain buspar dose

## 2022-04-13 ENCOUNTER — Encounter: Payer: Self-pay | Admitting: Family Medicine

## 2022-04-13 ENCOUNTER — Ambulatory Visit: Payer: 59 | Admitting: Family Medicine

## 2022-04-13 VITALS — BP 122/78 | HR 76 | Temp 97.1°F | Ht 65.0 in | Wt 184.6 lb

## 2022-04-13 DIAGNOSIS — K5904 Chronic idiopathic constipation: Secondary | ICD-10-CM | POA: Diagnosis not present

## 2022-04-13 DIAGNOSIS — R0789 Other chest pain: Secondary | ICD-10-CM | POA: Diagnosis not present

## 2022-04-13 NOTE — Progress Notes (Signed)
Established Patient Office Visit   Subjective:  Patient ID: Abigail [name], female    DOB: [birth year]  Age: 51 y.o. MRN: [id number]  Chief Complaint  Patient presents with   Follow-up    Follow up on lower right side pains, per patient theres's some improvement seems to come and go.     HPI Encounter Diagnoses  Name Primary?   Chest wall pain Yes   Chronic idiopathic constipation    Intermittent pain persists in her right lower lateral chest wall.  She feels spasms from time to time.  She has felt pain in the chest wall with movement through the chest.  There is been no fever chills, cough, nausea or vomiting.  She has an ongoing issue with constipation.  She may go up to 4 to 5 days without stooling.  First seen for this issue a month ago.  CBC and CMP were both normal.  There is been no injury.   Review of Systems  Constitutional: Negative.   HENT: Negative.    Eyes:  Negative for blurred vision, discharge and redness.  Respiratory: Negative.  Negative for shortness of breath.   Cardiovascular: Negative.  Negative for palpitations.  Gastrointestinal:  Positive for constipation. Negative for abdominal pain, blood in stool, diarrhea and melena.  Genitourinary: Negative.   Musculoskeletal: Negative.  Negative for myalgias.  Skin:  Negative for rash.  Neurological:  Negative for tingling, loss of consciousness and weakness.  Endo/Heme/Allergies:  Negative for polydipsia.     Current Outpatient Medications:    amLODipine (NORVASC) 10 MG tablet, Take 1 tablet (10 mg total) by mouth daily., Disp: 90 tablet, Rfl: 3   busPIRone (BUSPAR) 5 MG tablet, Take 1 tablet (5 mg total) by mouth 3 (three) times daily., Disp: 90 tablet, Rfl: 11   cyclobenzaprine (FLEXERIL) 10 MG tablet, Take 1 tablet (10 mg total) by mouth 3 (three) times daily as needed for muscle spasms., Disp: 30 tablet, Rfl: 0   diphenhydrAMINE (BENADRYL) 25 MG tablet, Take 25 mg by mouth as needed., Disp: , Rfl:     fluticasone (FLONASE) 50 MCG/ACT nasal spray, SHAKE LIQUID AND USE 1 SPRAY IN EACH NOSTRIL DAILY, Disp: 16 g, Rfl: 5   loratadine (CLARITIN) 10 MG tablet, Take 10 mg by mouth daily., Disp: , Rfl:    losartan (COZAAR) 25 MG tablet, TAKE 1 TABLET(25 MG) BY MOUTH DAILY, Disp: 90 tablet, Rfl: 3   omeprazole (PRILOSEC) 20 MG capsule, Take 1 capsule (20 mg total) by mouth daily., Disp: 90 capsule, Rfl: 3   SUMAtriptan (IMITREX) 25 MG tablet, Take 1-2 tablets (25-50 mg total) by mouth every 2 (two) hours as needed for migraine. May repeat in 2 hours if headache persists or recurs. No more than '100mg'$  in 24hrs, Disp: 10 tablet, Rfl: 0   triamcinolone ointment (KENALOG) 0.5 %, Apply 1 application topically 2 (two) times daily. You may use for 2 weeks at a time as needed., Disp: 30 g, Rfl: 0   Vitamin D, Ergocalciferol, 2000 units CAPS, Take 1 capsule by mouth daily after breakfast., Disp: 90 capsule, Rfl: 1   Objective:     BP 122/78 (BP Location: Left Arm, Patient Position: Sitting, Cuff Size: Normal)   Pulse 76   Temp (!) 97.1 F (36.2 C) (Temporal)   Ht '5\' 5"'$  (1.651 m)   Wt 184 lb 9.6 oz (83.7 kg)   LMP 01/18/2012 (Exact Date)   SpO2 96%   BMI 30.72 kg/m    HEENT normal.  Chest Lungs CTA. Heart RRR and Abdomen soft, mild tenderness without rebound or guarding,  non distended. normal bowel sounds.    No results found for any visits on 04/13/22.    The 10-year ASCVD risk score (Arnett DK, et al., 2019) is: 7.5%    Assessment & Plan:   Chest wall pain  Chronic idiopathic constipation    Return Schedule follow-up with Baldo Ash..  We will start MiraLAX 1 scoop twice daily until stooling regularly.  Back down to 1 scoop daily as needed.  Advised that chest wall strain could take several weeks to resolve.  Information was given on constipation.  Needs to follow-up with her primary provider.  Libby Maw, MD

## 2022-04-28 ENCOUNTER — Other Ambulatory Visit: Payer: Self-pay | Admitting: Nurse Practitioner

## 2022-04-28 DIAGNOSIS — I10 Essential (primary) hypertension: Secondary | ICD-10-CM

## 2022-04-29 NOTE — Telephone Encounter (Signed)
Chart supports Rx Last OV: 04/2022 Next OV: 05/2022

## 2022-05-19 ENCOUNTER — Ambulatory Visit: Payer: 59 | Admitting: Nurse Practitioner

## 2022-06-10 ENCOUNTER — Ambulatory Visit (HOSPITAL_COMMUNITY): Payer: Self-pay | Admitting: Psychiatry

## 2022-06-11 ENCOUNTER — Encounter: Payer: Self-pay | Admitting: Nurse Practitioner

## 2022-06-11 ENCOUNTER — Other Ambulatory Visit: Payer: Self-pay

## 2022-06-11 DIAGNOSIS — J302 Other seasonal allergic rhinitis: Secondary | ICD-10-CM

## 2022-06-11 MED ORDER — FLUTICASONE PROPIONATE 50 MCG/ACT NA SUSP: NASAL | 5 refills | Status: DC

## 2022-06-16 ENCOUNTER — Encounter: Payer: Self-pay | Admitting: Nurse Practitioner

## 2022-06-16 ENCOUNTER — Ambulatory Visit: Payer: 59 | Admitting: Nurse Practitioner

## 2022-06-16 VITALS — BP 138/80 | HR 67 | Temp 98.3°F | Ht 66.0 in | Wt 184.0 lb

## 2022-06-16 DIAGNOSIS — R002 Palpitations: Secondary | ICD-10-CM | POA: Diagnosis not present

## 2022-06-16 DIAGNOSIS — G4481 Hypnic headache: Secondary | ICD-10-CM

## 2022-06-16 DIAGNOSIS — E1165 Type 2 diabetes mellitus with hyperglycemia: Secondary | ICD-10-CM | POA: Diagnosis not present

## 2022-06-16 DIAGNOSIS — I10 Essential (primary) hypertension: Secondary | ICD-10-CM | POA: Diagnosis not present

## 2022-06-16 LAB — CBC
HCT: 40.7 % (ref 36.0–46.0)
Hemoglobin: 13.6 g/dL (ref 12.0–15.0)
MCHC: 33.5 g/dL (ref 30.0–36.0)
MCV: 92.3 fl (ref 78.0–100.0)
Platelets: 300 10*3/uL (ref 150.0–400.0)
RBC: 4.41 Mil/uL (ref 3.87–5.11)
RDW: 13.4 % (ref 11.5–15.5)
WBC: 5.8 10*3/uL (ref 4.0–10.5)

## 2022-06-16 LAB — BASIC METABOLIC PANEL
BUN: 18 mg/dL (ref 6–23)
CO2: 28 mEq/L (ref 19–32)
Calcium: 9.6 mg/dL (ref 8.4–10.5)
Chloride: 104 mEq/L (ref 96–112)
Creatinine, Ser: 0.81 mg/dL (ref 0.40–1.20)
GFR: 83.3 mL/min (ref >60.00)
Glucose, Bld: 100 mg/dL — ABNORMAL HIGH (ref 70–99)
Potassium: 4.2 mEq/L (ref 3.5–5.1)
Sodium: 141 mEq/L (ref 135–145)

## 2022-06-16 LAB — LDL CHOLESTEROL, DIRECT: Direct LDL: 128 mg/dL

## 2022-06-16 LAB — TSH: TSH: 0.72 u[IU]/mL (ref 0.35–5.50)

## 2022-06-16 LAB — MAGNESIUM: Magnesium: 2 mg/dL (ref 1.5–2.5)

## 2022-06-16 LAB — HEMOGLOBIN A1C: Hgb A1c MFr Bld: 6.2 % (ref 4.6–6.5)

## 2022-06-16 NOTE — Assessment & Plan Note (Signed)
Reports persistent AM headache which resolve a few minutes after waking up. Intermittent snoring, daytime fatigue and somnolence, intermittent palpitation at bedtime, no apneic episode  Entered referral to sleep clinic

## 2022-06-16 NOTE — Assessment & Plan Note (Signed)
Reports episode of elevated BP on Sunday. Associated with HA, palpitation and fatigue. BP and HR at work: 156/98 and 116 respectively. She denies any acute illness, no OTC med used, she is compliant with BP medications. Cardiac workup completed in last 19yr: echocardiogram, holter monitor, carotid doppler, and CT coronary calcium (all normal) BP Readings from Last 3 Encounters:  06/16/22 138/80  04/13/22 122/78  03/13/22 120/88    BP is normal today. Check bmp, cbc, tsh and magnesium Advised to maintain adequate oral hydration.

## 2022-06-16 NOTE — Patient Instructions (Addendum)
Go to lab Maintain current medications You will be contacted to schedule appt with sleep clinic

## 2022-06-16 NOTE — Progress Notes (Signed)
Established Patient Visit  Patient: Abigail [name]   DOB: 1970-08-02   52 y.o. Female  MRN: [id number] Visit Date: 06/16/2022  Subjective:    Chief Complaint  Patient presents with   Acute Visit    BP concerns. 150/90 on Sunday 2/4. Weakness in legs, foggy-feeling, headaches, chest feels heavy when lying down. Taking meds everyday.   HPI Migraine without aura and without status migrainosus, not intractable Reports persistent AM headache which resolve a few minutes after waking up. Intermittent snoring, daytime fatigue and somnolence, intermittent palpitation at bedtime, no apneic episode  Entered referral to sleep clinic  DM (diabetes mellitus) ([place]) Repeat hgbA1c today  HTN (hypertension) Reports episode of elevated BP on Sunday. Associated with HA, palpitation and fatigue. BP and HR at work: 156/98 and 116 respectively. She denies any acute illness, no OTC med used, she is compliant with BP medications. Cardiac workup completed in last 14yr: echocardiogram, holter monitor, carotid doppler, and CT coronary calcium (all normal) BP Readings from Last 3 Encounters:  06/16/22 138/80  04/13/22 122/78  03/13/22 120/88    BP is normal today. Check bmp, cbc, tsh and magnesium Advised to maintain adequate oral hydration.  Reviewed medical, surgical, and social history today  Medications: Outpatient Medications Prior to Visit  Medication Sig   amLODipine (NORVASC) 10 MG tablet Take 1 tablet (10 mg total) by mouth daily.   busPIRone (BUSPAR) 5 MG tablet Take 1 tablet (5 mg total) by mouth 3 (three) times daily.   diphenhydrAMINE (BENADRYL) 25 MG tablet Take 25 mg by mouth as needed.   fluticasone (FLONASE) 50 MCG/ACT nasal spray SHAKE LIQUID AND USE 1 SPRAY IN EACH NOSTRIL DAILY   loratadine (CLARITIN) 10 MG tablet Take 10 mg by mouth daily.   losartan (COZAAR) 25 MG tablet TAKE 1 TABLET(25 MG) BY MOUTH DAILY   omeprazole (PRILOSEC) 20 MG capsule Take 1 capsule (20 mg  total) by mouth daily.   SUMAtriptan (IMITREX) 25 MG tablet Take 1-2 tablets (25-50 mg total) by mouth every 2 (two) hours as needed for migraine. May repeat in 2 hours if headache persists or recurs. No more than '100mg'$  in 24hrs   triamcinolone ointment (KENALOG) 0.5 % Apply 1 application topically 2 (two) times daily. You may use for 2 weeks at a time as needed.   Vitamin D, Ergocalciferol, 2000 units CAPS Take 1 capsule by mouth daily after breakfast.   cyclobenzaprine (FLEXERIL) 10 MG tablet Take 1 tablet (10 mg total) by mouth 3 (three) times daily as needed for muscle spasms. (Patient not taking: Reported on 06/16/2022)   No facility-administered medications prior to visit.   Reviewed past medical and social history.   ROS per HPI above      Objective:  BP 138/80 (BP Location: Left Arm, Patient Position: Sitting)   Pulse 67   Temp 98.3 F (36.8 C) (Temporal)   Ht '5\' 6"'$  (1.676 m)   Wt 184 lb (83.5 kg)   LMP 01/18/2012 (Exact Date)   SpO2 97%   BMI 29.70 kg/m      Physical Exam Constitutional:      Appearance: She is obese.  Cardiovascular:     Rate and Rhythm: Normal rate and regular rhythm.     Pulses: Normal pulses.     Heart sounds: No murmur heard.    No friction rub. Gallop present.  Pulmonary:     Effort: Pulmonary effort is  normal.     Breath sounds: Normal breath sounds.  Musculoskeletal:     Right lower leg: No edema.     Left lower leg: No edema.  Neurological:     Mental Status: She is alert and oriented to person, place, and time.     No results found for any visits on 06/16/22.    Assessment & Plan:    Problem List Items Addressed This Visit       Cardiovascular and Mediastinum   HTN (hypertension) - Primary    Reports episode of elevated BP on Sunday. Associated with HA, palpitation and fatigue. BP and HR at work: 156/98 and 116 respectively. She denies any acute illness, no OTC med used, she is compliant with BP medications. Cardiac workup  completed in last 9yr: echocardiogram, holter monitor, carotid doppler, and CT coronary calcium (all normal) BP Readings from Last 3 Encounters:  06/16/22 138/80  04/13/22 122/78  03/13/22 120/88    BP is normal today. Check bmp, cbc, tsh and magnesium Advised to maintain adequate oral hydration.      Relevant Orders   Basic metabolic panel   Ambulatory referral to Sleep Studies     Endocrine   DM (diabetes mellitus) ([place]    Repeat hgbA1c today      Relevant Orders   Hemoglobin A1c   Direct LDL     Other   Intermittent palpitations   Relevant Orders   Magnesium   TSH   CBC   Basic metabolic panel   Other Visit Diagnoses     Hypnic headache       Relevant Orders   Ambulatory referral to Sleep Studies      Return in about 3 months (around 09/14/2022) for HTN, hyperlipidemia (fasting).     CWilfred Lacy NP

## 2022-06-16 NOTE — Assessment & Plan Note (Signed)
Repeat hgbA1c today

## 2022-06-19 ENCOUNTER — Encounter (HOSPITAL_COMMUNITY): Payer: Self-pay | Admitting: Psychiatry

## 2022-06-19 ENCOUNTER — Ambulatory Visit (HOSPITAL_BASED_OUTPATIENT_CLINIC_OR_DEPARTMENT_OTHER): Payer: 59 | Admitting: Psychiatry

## 2022-06-19 DIAGNOSIS — F410 Panic disorder [episodic paroxysmal anxiety] without agoraphobia: Secondary | ICD-10-CM | POA: Diagnosis not present

## 2022-06-19 MED ORDER — PROPRANOLOL HCL 10 MG PO TABS: 10.0000 mg | ORAL_TABLET | Freq: Two times a day (BID) | ORAL | 1 refills | Status: DC | PRN
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
## 2022-07-31 ENCOUNTER — Other Ambulatory Visit (HOSPITAL_COMMUNITY): Payer: Self-pay | Admitting: Psychiatry

## 2022-07-31 ENCOUNTER — Telehealth (HOSPITAL_BASED_OUTPATIENT_CLINIC_OR_DEPARTMENT_OTHER): Payer: 59 | Admitting: Psychiatry

## 2022-07-31 ENCOUNTER — Encounter (HOSPITAL_COMMUNITY): Payer: Self-pay | Admitting: Psychiatry

## 2022-07-31 DIAGNOSIS — F410 Panic disorder [episodic paroxysmal anxiety] without agoraphobia: Secondary | ICD-10-CM | POA: Diagnosis not present

## 2022-07-31 MED ORDER — PROPRANOLOL HCL 10 MG PO TABS: 10.0000 mg | ORAL_TABLET | Freq: Three times a day (TID) | ORAL | 1 refills | Status: DC

## 2022-07-31 NOTE — Progress Notes (Signed)
BH MD/PA/NP OP Progress Note  07/31/2022 7:55 AM Abigail [name]  MRN:  [id number]  Visit Diagnosis:    ICD-10-CM   1. Panic disorder  F41.0       Assessment: Abigail [name] is a 52 y.o. female with a history of GAD, Vitamin D deficiency  who presented to [place] at [facility] for initial evaluation on 06/19/2022.  During initial evaluation patient reported experiencing symptoms of adrenaline rush, racing heart, flushed feeling, occasional blood pressure elevations, and feelings of "antsiness" that have been occurring for around a year now.  Symptoms occur secondary to stressful, emotional, or exciting situations and can lead to some minor anxiety that there is something wrong with her.  Episodes resolve after she is able to breathe for a few minutes.  Of note patient has a past history of panic attacks which presented with palpitations, fatigue, hypertension, and fear of something awful happening.  She describes these current episodes as different from her prior panic attacks.  Patient denied any depressed mood, anxiety, psychosis, paranoia, delusions, or history of mania.  She did endorse a past history of trauma though denies symptoms or symptoms consistent with PTSD.  Patient met criteria for panic disorder.  Abigail [name] presents for follow-up evaluation. Today, 07/31/22, patient reports some improvement on the prorpanolol over the past month. The episodes have seemed to decrease some and do not occur around the times she takes the medication. She denies any adverse effects from the medication. We will increase the dose to 10 mg TID scheduled today.   Plan: - Increase Propranolol to 10 mg TID prn anxiety - Discontinued BuSpar  - Can consider therapy in the future - Crisis resources reviewed - Follow up in 6 weeks   Chief Complaint:  Chief Complaint  Patient presents with   Follow-up   HPI: Abigail [name] presents reporting that she feels that she is a little bit  better over the past month. She thinks the medication helps sometimes, but she still can feel that sense of adrenaline rush/palpitations during times when she has not taken it. Currently she is using the medication around once a day, occasionally twice day. She does not believe that there have been episodes of this when she has taken the medication shortly beforehand. She denies any adverse side effects from the medication. Patient also mentioned that she can wake up with racing blood pressure/palpitations at times and was unsure if it was related to the anxiety/medicine.  Of note she had one panic attack out of the blue a couple weeks ago, that felt like a heart attack. This was her first panic episode like this in a while. She called her husband her and he talked her through it. Abigail [name] is not sure what the trigger was but does know that it ocured after the dentist which is typically stressful for.   Past Psychiatric History: Denies any prior psychiatric care until starting BuSpar from her PCP recently. Had been diagnosed with depression in the past which improved as her personal life  Has tried Buspar 5 mg TID, Ambien, Zoloft, and possibly fluoxetine (head hurt, joint aches)  Started propranolol on 06/19/2022  Substance use uses alcohol once or twice a week. Denies any othe substance  Past Medical History:  Past Medical History:  Diagnosis Date   Anemia    iron supp   Anxiety    COVID 05/2020   Depression    Fibroids    GERD (gastroesophageal reflux disease)    omeprazole  Headache(784.0)    Hypertension 2012   Maxide   S/P LEEP 1992   Urticaria     Past Surgical History:  Procedure Laterality Date   Inicision and drainage of thrombosed hemorrhoid  04/06/2018   LEEP  1998   Abnormal pap   SUPRACERVICAL ABDOMINAL HYSTERECTOMY  01/18/2012   Procedure: HYSTERECTOMY SUPRACERVICAL ABDOMINAL with LSO;  Surgeon: Osborne Oman, MD;  Location: [facility];  Service: Gynecology;  Laterality:  N/A;   TUBAL LIGATION  1997   WISDOM TOOTH EXTRACTION      Family History:  Family History  Problem Relation Age of Onset   Hypertension Mother    Arthritis Mother    Pulmonary fibrosis Mother    Hypertension Father    Depression Father    Diabetes Father    Alcohol abuse Father    Colonic polyp Father    Diabetes Sister    Congestive Heart Failure Sister    Hypertension Sister    Breast cancer Daughter 24   Breast cancer Paternal Grandmother    Heart attack Paternal Grandmother    Breast cancer Cousin        1st cousin, paternal   Diabetes Brother    Allergic rhinitis Neg Hx    Angioedema Neg Hx    Asthma Neg Hx    Eczema Neg Hx    Immunodeficiency Neg Hx    Urticaria Neg Hx     Social History:  Social History   Socioeconomic History   Marital status: Married    Spouse name: Not on file   Number of children: 2   Years of education: 14   Highest education level: Not on file  Occupational History   Occupation: MEDICAL ASSISTANT    Employer: [employer]  Tobacco Use   Smoking status: Never   Smokeless tobacco: Never  Vaping Use   Vaping Use: Never used  Substance and Sexual Activity   Alcohol use: Yes    Alcohol/week: 2.0 standard drinks of alcohol    Types: 2 Standard drinks or equivalent per week    Comment: socially   Drug use: No   Sexual activity: Yes    Birth control/protection: Surgical    Comment: Hysterectomy  Other Topics Concern   Not on file  Social History Narrative   Regular exercise-no   Caffeine Use-yes   Social Determinants of Health   Financial Resource Strain: Not on file  Food Insecurity: Not on file  Transportation Needs: Not on file  Physical Activity: Not on file  Stress: Not on file  Social Connections: Not on file    Allergies:  Allergies  Allergen Reactions   Shellfish Allergy Itching and Swelling    Swollen tongue, itching   Tylox [Oxycodone-Acetaminophen] Itching   Tyloxapol     Current  Medications: Current Outpatient Medications  Medication Sig Dispense Refill   amLODipine (NORVASC) 10 MG tablet Take 1 tablet (10 mg total) by mouth daily. 90 tablet 3   cyclobenzaprine (FLEXERIL) 10 MG tablet Take 1 tablet (10 mg total) by mouth 3 (three) times daily as needed for muscle spasms. (Patient not taking: Reported on 06/16/2022) 30 tablet 0   diphenhydrAMINE (BENADRYL) 25 MG tablet Take 25 mg by mouth as needed.     fluticasone (FLONASE) 50 MCG/ACT nasal spray SHAKE LIQUID AND USE 1 SPRAY IN EACH NOSTRIL DAILY 16 g 5   loratadine (CLARITIN) 10 MG tablet Take 10 mg by mouth daily.     losartan (COZAAR) 25 MG tablet  TAKE 1 TABLET(25 MG) BY MOUTH DAILY 90 tablet 3   omeprazole (PRILOSEC) 20 MG capsule Take 1 capsule (20 mg total) by mouth daily. 90 capsule 3   propranolol (INDERAL) 10 MG tablet Take 1 tablet (10 mg total) by mouth 2 (two) times daily as needed (anxiety). 60 tablet 1   SUMAtriptan (IMITREX) 25 MG tablet Take 1-2 tablets (25-50 mg total) by mouth every 2 (two) hours as needed for migraine. May repeat in 2 hours if headache persists or recurs. No more than 100mg  in 24hrs 10 tablet 0   triamcinolone ointment (KENALOG) 0.5 % Apply 1 application topically 2 (two) times daily. You may use for 2 weeks at a time as needed. 30 g 0   Vitamin D, Ergocalciferol, 2000 units CAPS Take 1 capsule by mouth daily after breakfast. 90 capsule 1   No current facility-administered medications for this visit.     Psychiatric Specialty Exam: Review of Systems  Last menstrual period 01/18/2012.There is no height or weight on file to calculate BMI.  General Appearance: Well Groomed  Eye Contact:  Good  Speech:  Clear and Coherent and Normal Rate  Volume:  Normal  Mood:  Anxious  Affect:  Appropriate  Thought Process:  Coherent, Goal Directed, and Linear  Orientation:  Full (Time, Place, and Person)  Thought Content: Logical   Suicidal Thoughts:  No  Homicidal Thoughts:  No  Memory:   Immediate;   Good  Judgement:  Good  Insight:  Fair  Psychomotor Activity:  Normal  Concentration:  Concentration: Good  Recall:  Good  Fund of Knowledge: Good  Language: Good  Akathisia:  NA    AIMS (if indicated): not done  Assets:  Communication Skills Desire for Improvement Financial Resources/Insurance Housing Resilience Social Support Talents/Skills Transportation Vocational/Educational  ADL's:  Intact  Cognition: WNL  Sleep:  Good   Metabolic Disorder Labs: Lab Results  Component Value Date   HGBA1C 6.2 06/16/2022   MPG 114 07/28/2016   No results found for: "PROLACTIN" Lab Results  Component Value Date   CHOL 211 (H) 08/18/2021   TRIG 90.0 08/18/2021   HDL 59.10 08/18/2021   CHOLHDL 4 08/18/2021   VLDL 18.0 08/18/2021   LDLCALC 134 (H) 08/18/2021   LDLCALC 112 (H) 01/31/2021   Lab Results  Component Value Date   TSH 0.72 06/16/2022   TSH 0.47 08/13/2020    Therapeutic Level Labs: No results found for: "LITHIUM" No results found for: "VALPROATE" No results found for: "CBMZ"   Screenings: GAD-7    [place] Office Visit from 06/19/2022 in [place] [facility] Visit from 08/18/2021 in [place] at [facility] [facility] Visit from 12/20/2020 in [place] at [place] from 07/27/2017 in [place] at [facility]  Total GAD-7 Score 0 0 4 4      PHQ2-9    [place] Visit from 06/19/2022 in [place] [facility] Office Visit from 06/16/2022 in [place] at [facility] Visit from 04/13/2022 in [place] at [place] from 02/16/2022 in [place] at [place] from 08/18/2021 in [place] at [facility]  PHQ-2 Total Score 0 0 0 0 0  PHQ-9 Total Score --  -- -- -- 0       Collaboration of Care: Collaboration of Care: Medication Management AEB medication prescription  Patient/Guardian  was advised Release of Information must be obtained prior to any record release in order to collaborate their care with an outside provider. Patient/Guardian was advised if they have not already done so to contact the registration department to sign all necessary forms in order for [place] to release information regarding their care.   Consent: Patient/Guardian gives verbal consent for treatment and assignment of benefits for services provided during this visit. Patient/Guardian expressed understanding and agreed to proceed.    Vista Mink, MD 07/31/2022, 7:55 AM   Virtual Visit via Video Note  I connected with Abigail [name] on 07/31/22 at  9:00 AM EDT by a video enabled telemedicine application and verified that I am speaking with the correct person using two identifiers.  Location: Patient: Home Provider: Home Office   I discussed the limitations of evaluation and management by telemedicine and the availability of in person appointments. The patient expressed understanding and agreed to proceed.   I discussed the assessment and treatment plan with the patient. The patient was provided an opportunity to ask questions and all were answered. The patient agreed with the plan and demonstrated an understanding of the instructions.   The patient was advised to call back or seek an in-person evaluation if the symptoms worsen or if the condition fails to improve as anticipated.  I provided 10 minutes of non-face-to-face time during this encounter.   Vista Mink, MD

## 2022-08-18 ENCOUNTER — Other Ambulatory Visit: Payer: Self-pay | Admitting: Nurse Practitioner

## 2022-08-18 DIAGNOSIS — G43009 Migraine without aura, not intractable, without status migrainosus: Secondary | ICD-10-CM

## 2022-08-20 ENCOUNTER — Encounter: Payer: Self-pay | Admitting: Nurse Practitioner

## 2022-08-20 ENCOUNTER — Other Ambulatory Visit: Payer: Self-pay

## 2022-08-20 DIAGNOSIS — G43009 Migraine without aura, not intractable, without status migrainosus: Secondary | ICD-10-CM

## 2022-08-20 MED ORDER — SUMATRIPTAN SUCCINATE 25 MG PO TABS: 25.0000 mg | ORAL_TABLET | ORAL | 0 refills | Status: DC | PRN

## 2022-09-11 ENCOUNTER — Encounter (HOSPITAL_COMMUNITY): Payer: Self-pay | Admitting: Psychiatry

## 2022-09-11 ENCOUNTER — Telehealth (HOSPITAL_BASED_OUTPATIENT_CLINIC_OR_DEPARTMENT_OTHER): Payer: 59 | Admitting: Psychiatry

## 2022-09-11 DIAGNOSIS — F410 Panic disorder [episodic paroxysmal anxiety] without agoraphobia: Secondary | ICD-10-CM | POA: Diagnosis not present

## 2022-09-11 MED ORDER — PROPRANOLOL HCL 10 MG PO TABS: 10.0000 mg | ORAL_TABLET | Freq: Three times a day (TID) | ORAL | 1 refills | Status: DC | PRN

## 2022-09-11 NOTE — Progress Notes (Addendum)
BH MD/PA/NP OP Progress Note  09/11/2022 9:44 AM Abigail [name]  MRN:  [id number]  Visit Diagnosis:    ICD-10-CM   1. Panic disorder  F41.0 propranolol (INDERAL) 10 MG tablet      Assessment: Abigail [name] is a 52 y.o. female with a history of GAD, Vitamin D deficiency  who presented to [facility] for initial evaluation on 06/19/2022.  During initial evaluation patient reported experiencing symptoms of adrenaline rush, racing heart, flushed feeling, occasional blood pressure elevations, and feelings of "antsiness" that have been occurring for around a year now.  Symptoms occur secondary to stressful, emotional, or exciting situations and can lead to some minor anxiety that there is something wrong with her.  Episodes resolve after she is able to breathe for a few minutes.  Of note patient has a past history of panic attacks which presented with palpitations, fatigue, hypertension, and fear of something awful happening.  She describes these current episodes as different from her prior panic attacks.  Patient denied any depressed mood, anxiety, psychosis, paranoia, delusions, or history of mania.  She did endorse a past history of trauma though denies symptoms or symptoms consistent with PTSD.  Patient met criteria for panic disorder.  Abigail [name] presents for follow-up evaluation. Today, 09/11/22, patient reports increased improvement in her symptoms over the past month after increasing propranolol and using it more frequently.  The episodes of drooling can still occur though are a bit less frequent and have not occurred during work.  She also has not had any panic attacks in the past month and a half.  Patient denies any adverse side effects from the medication.  We will continue on her current regimen and follow-up in 2 to 3 months.  Plan: - Continue Propranolol 10 mg TID prn anxiety  - Can consider therapy in the future - Crisis resources reviewed - Follow up  in 2-3 months   Chief Complaint:  Chief Complaint  Patient presents with   Follow-up   HPI: Abigail [name] presents reporting the past month and a half have gone pretty well and she is doing okay.  She has decreased the propranolol and is taking it more regularly and thinks it has been helping some.  She has not had any more palpitations in her sleep and the episodes of adrenaline rush/palpitations have been less frequent.  She denies any adverse side effects from the medication.  Patient notes that she can still get a bit antsy or over excited about something that she finds enjoyable such as a good movie at which time she will take propranolol and I will help her calm down.  She denies any episodes of work since last visit however also denies any triggers that could cause an episode.  Patient also denies any panic attacks since the last visit.  We discussed treatment options including increasing propranolol, adding a long-acting agent for anxiety, or staying on the current dose.  Based off her current level improvement patient was open to continuing on her current regimen.  Past Psychiatric History: Denies any prior psychiatric care until starting BuSpar from her PCP recently. Had been diagnosed with depression in the past which improved as her personal life  Has tried Buspar 5 mg TID, Ambien, Zoloft, and possibly fluoxetine (head hurt, joint aches)  Started propranolol on 06/19/2022  Substance use uses alcohol once or twice a week. Denies any othe substance  Past Medical History:  Past Medical History:  Diagnosis Date  Anemia    iron supp   Anxiety    COVID 05/2020   Depression    Fibroids    GERD (gastroesophageal reflux disease)    omeprazole   Headache(784.0)    Hypertension 2012   Maxide   S/P LEEP 1992   Urticaria     Past Surgical History:  Procedure Laterality Date   Inicision and drainage of thrombosed hemorrhoid  04/06/2018   LEEP  1998   Abnormal pap   SUPRACERVICAL  ABDOMINAL HYSTERECTOMY  01/18/2012   Procedure: HYSTERECTOMY SUPRACERVICAL ABDOMINAL with LSO;  Surgeon: Tereso Newcomer, MD;  Location: [facility];  Service: Gynecology;  Laterality: N/A;   TUBAL LIGATION  1997   WISDOM TOOTH EXTRACTION      Family History:  Family History  Problem Relation Age of Onset   Hypertension Mother    Arthritis Mother    Pulmonary fibrosis Mother    Hypertension Father    Depression Father    Diabetes Father    Alcohol abuse Father    Colonic polyp Father    Diabetes Sister    Congestive Heart Failure Sister    Hypertension Sister    Breast cancer Daughter 53   Breast cancer Paternal Grandmother    Heart attack Paternal Grandmother    Breast cancer Cousin        1st cousin, paternal   Diabetes Brother    Allergic rhinitis Neg Hx    Angioedema Neg Hx    Asthma Neg Hx    Eczema Neg Hx    Immunodeficiency Neg Hx    Urticaria Neg Hx     Social History:  Social History   Socioeconomic History   Marital status: Married    Spouse name: Not on file   Number of children: 2   Years of education: 14   Highest education level: Not on file  Occupational History   Occupation: MEDICAL ASSISTANT    Employer: [employer]  Tobacco Use   Smoking status: Never   Smokeless tobacco: Never  Vaping Use   Vaping Use: Never used  Substance and Sexual Activity   Alcohol use: Yes    Alcohol/week: 2.0 standard drinks of alcohol    Types: 2 Standard drinks or equivalent per week    Comment: socially   Drug use: No   Sexual activity: Yes    Birth control/protection: Surgical    Comment: Hysterectomy  Other Topics Concern   Not on file  Social History Narrative   Regular exercise-no   Caffeine Use-yes   Social Determinants of Health   Financial Resource Strain: Not on file  Food Insecurity: Not on file  Transportation Needs: Not on file  Physical Activity: Not on file  Stress: Not on file  Social Connections: Not on file    Allergies:   Allergies  Allergen Reactions   Shellfish Allergy Itching and Swelling    Swollen tongue, itching   Tylox [Oxycodone-Acetaminophen] Itching   Tyloxapol     Current Medications: Current Outpatient Medications  Medication Sig Dispense Refill   amLODipine (NORVASC) 10 MG tablet Take 1 tablet (10 mg total) by mouth daily. 90 tablet 3   cyclobenzaprine (FLEXERIL) 10 MG tablet Take 1 tablet (10 mg total) by mouth 3 (three) times daily as needed for muscle spasms. (Patient not taking: Reported on 06/16/2022) 30 tablet 0   diphenhydrAMINE (BENADRYL) 25 MG tablet Take 25 mg by mouth as needed.     fluticasone (FLONASE) 50 MCG/ACT nasal spray SHAKE  LIQUID AND USE 1 SPRAY IN EACH NOSTRIL DAILY 16 g 5   loratadine (CLARITIN) 10 MG tablet Take 10 mg by mouth daily.     losartan (COZAAR) 25 MG tablet TAKE 1 TABLET(25 MG) BY MOUTH DAILY 90 tablet 3   omeprazole (PRILOSEC) 20 MG capsule Take 1 capsule (20 mg total) by mouth daily. 90 capsule 3   propranolol (INDERAL) 10 MG tablet Take 1 tablet (10 mg total) by mouth 3 (three) times daily as needed. 270 tablet 1   SUMAtriptan (IMITREX) 25 MG tablet Take 1-2 tablets (25-50 mg total) by mouth every 2 (two) hours as needed for migraine. May repeat in 2 hours if headache persists or recurs. No more than 100mg  in 24hrs 10 tablet 0   triamcinolone ointment (KENALOG) 0.5 % Apply 1 application topically 2 (two) times daily. You may use for 2 weeks at a time as needed. 30 g 0   Vitamin D, Ergocalciferol, 2000 units CAPS Take 1 capsule by mouth daily after breakfast. 90 capsule 1   No current facility-administered medications for this visit.     Psychiatric Specialty Exam: Review of Systems  Last menstrual period 01/18/2012.There is no height or weight on file to calculate BMI.  General Appearance: Well Groomed  Eye Contact:  Good  Speech:  Clear and Coherent and Normal Rate  Volume:  Normal  Mood:  Euthymic  Affect:  Appropriate  Thought Process:   Coherent, Goal Directed, and Linear  Orientation:  Full (Time, Place, and Person)  Thought Content: Logical   Suicidal Thoughts:  No  Homicidal Thoughts:  No  Memory:  Immediate;   Good  Judgement:  Good  Insight:  Fair  Psychomotor Activity:  Normal  Concentration:  Concentration: Good  Recall:  Good  Fund of Knowledge: Good  Language: Good  Akathisia:  NA    AIMS (if indicated): not done  Assets:  Communication Skills Desire for Improvement Financial Resources/Insurance Housing Resilience Social Support Talents/Skills Transportation Vocational/Educational  ADL's:  Intact  Cognition: WNL  Sleep:  Good   Metabolic Disorder Labs: Lab Results  Component Value Date   HGBA1C 6.2 06/16/2022   MPG 114 07/28/2016   No results found for: "PROLACTIN" Lab Results  Component Value Date   CHOL 211 (H) 08/18/2021   TRIG 90.0 08/18/2021   HDL 59.10 08/18/2021   CHOLHDL 4 08/18/2021   VLDL 18.0 08/18/2021   LDLCALC 134 (H) 08/18/2021   LDLCALC 112 (H) 01/31/2021   Lab Results  Component Value Date   TSH 0.72 06/16/2022   TSH 0.47 08/13/2020    Therapeutic Level Labs: No results found for: "LITHIUM" No results found for: "VALPROATE" No results found for: "CBMZ"   Screenings: GAD-7    [place] [facility] Visit from 08/18/2021 in [facility] Visit from 12/20/2020 in [facility] Visit from 07/27/2017 in [facility] [employer]  Total GAD-7 Score 0 0 4 4      PHQ2-9    Flowsheet Row Office [facility] Visit from 06/16/2022 in [facility] Visit from 04/13/2022 in [facility] Visit from 02[facility]16/2022 in [facility] Visit from 08/18/2021 in [facility] Total Score  0 0 0 0 0  PHQ-9 Total Score -- -- -- -- 0       Collaboration of Care: Collaboration of Care: Medication Management AEB medication prescription  Patient/Guardian was advised Release of Information must be obtained prior to any record release in order to collaborate their care with an outside provider. Patient/Guardian was advised if they have not already done so to contact the registration department to sign all necessary forms in order for [place] to release information regarding their care.   Consent: Patient/Guardian gives verbal consent for treatment and assignment of benefits for services provided during this visit. Patient/Guardian expressed understanding and agreed to proceed.    Stasia Cavalier, MD 09/11/2022, 9:44 AM   Virtual Visit via Video Note  I connected with Abigail [name] on 09/11/22 at  9:00 AM EDT by a video enabled telemedicine application and verified that I am speaking with the correct person using two identifiers.  Location: Patient: Home Provider: Home Office   I discussed the limitations of evaluation and management by telemedicine and the availability of in person appointments. The patient expressed understanding and agreed to proceed.   I discussed the assessment and treatment plan with the patient. The patient was provided an opportunity to ask questions and all were answered. The patient agreed with the plan and demonstrated an understanding of the instructions.   The patient was advised to call back or seek an in-person evaluation if the symptoms worsen or if the condition fails to improve as anticipated.  I provided 15 minutes of non-face-to-face time during this encounter.   Stasia Cavalier, MD

## 2022-09-22 ENCOUNTER — Encounter: Payer: Self-pay | Admitting: Nurse Practitioner

## 2022-09-24 ENCOUNTER — Encounter: Payer: 59 | Admitting: Nurse Practitioner

## 2022-09-28 LAB — HM DIABETES EYE EXAM

## 2022-10-01 ENCOUNTER — Other Ambulatory Visit: Payer: Self-pay | Admitting: Nurse Practitioner

## 2022-10-01 DIAGNOSIS — K219 Gastro-esophageal reflux disease without esophagitis: Secondary | ICD-10-CM

## 2022-10-02 ENCOUNTER — Other Ambulatory Visit: Payer: Self-pay

## 2022-10-02 ENCOUNTER — Other Ambulatory Visit: Payer: Self-pay | Admitting: Nurse Practitioner

## 2022-10-02 DIAGNOSIS — I10 Essential (primary) hypertension: Secondary | ICD-10-CM

## 2022-10-02 DIAGNOSIS — K219 Gastro-esophageal reflux disease without esophagitis: Secondary | ICD-10-CM

## 2022-10-02 MED ORDER — OMEPRAZOLE 20 MG PO CPDR: 20.0000 mg | DELAYED_RELEASE_CAPSULE | Freq: Every day | ORAL | 3 refills | Status: DC

## 2022-10-02 MED ORDER — AMLODIPINE BESYLATE 10 MG PO TABS: 10.0000 mg | ORAL_TABLET | Freq: Every day | ORAL | 0 refills | Status: DC

## 2022-10-20 ENCOUNTER — Ambulatory Visit: Payer: 59 | Admitting: Nurse Practitioner

## 2022-11-06 ENCOUNTER — Encounter: Payer: Self-pay | Admitting: Nurse Practitioner

## 2022-11-06 ENCOUNTER — Ambulatory Visit (INDEPENDENT_AMBULATORY_CARE_PROVIDER_SITE_OTHER): Payer: 59 | Admitting: Nurse Practitioner

## 2022-11-06 VITALS — BP 120/82 | HR 78 | Temp 97.6°F | Resp 16 | Ht 66.0 in | Wt 184.2 lb

## 2022-11-06 DIAGNOSIS — Z0001 Encounter for general adult medical examination with abnormal findings: Secondary | ICD-10-CM

## 2022-11-06 DIAGNOSIS — E7800 Pure hypercholesterolemia, unspecified: Secondary | ICD-10-CM

## 2022-11-06 DIAGNOSIS — Z0000 Encounter for general adult medical examination without abnormal findings: Secondary | ICD-10-CM | POA: Diagnosis not present

## 2022-11-06 DIAGNOSIS — E785 Hyperlipidemia, unspecified: Secondary | ICD-10-CM

## 2022-11-06 DIAGNOSIS — E1169 Type 2 diabetes mellitus with other specified complication: Secondary | ICD-10-CM | POA: Diagnosis not present

## 2022-11-06 DIAGNOSIS — E1165 Type 2 diabetes mellitus with hyperglycemia: Secondary | ICD-10-CM | POA: Diagnosis not present

## 2022-11-06 LAB — COMPREHENSIVE METABOLIC PANEL
ALT: 16 U/L (ref 0–35)
AST: 19 U/L (ref 0–37)
Albumin: 4.3 g/dL (ref 3.5–5.2)
Alkaline Phosphatase: 69 U/L (ref 39–117)
BUN: 14 mg/dL (ref 6–23)
CO2: 32 mEq/L (ref 19–32)
Calcium: 10.1 mg/dL (ref 8.4–10.5)
Chloride: 102 mEq/L (ref 96–112)
Creatinine, Ser: 0.74 mg/dL (ref 0.40–1.20)
GFR: 92.59 mL/min (ref >60.00)
Glucose, Bld: 96 mg/dL (ref 70–99)
Potassium: 4.5 mEq/L (ref 3.5–5.1)
Sodium: 140 mEq/L (ref 135–145)
Total Bilirubin: 0.4 mg/dL (ref 0.2–1.2)
Total Protein: 7.4 g/dL (ref 6.0–8.3)

## 2022-11-06 LAB — LIPID PANEL
Cholesterol: 234 mg/dL — ABNORMAL HIGH (ref 0–200)
HDL: 55.9 mg/dL (ref >39.00)
LDL Cholesterol: 152 mg/dL — ABNORMAL HIGH (ref 0–99)
NonHDL: 177.7
Total CHOL/HDL Ratio: 4
Triglycerides: 128 mg/dL (ref 0.0–149.0)
VLDL: 25.6 mg/dL (ref 0.0–40.0)

## 2022-11-06 LAB — HEMOGLOBIN A1C: Hgb A1c MFr Bld: 6.2 % (ref 4.6–6.5)

## 2022-11-06 NOTE — Assessment & Plan Note (Signed)
Repeat hgbA1c and lipid panel today BP at goal Eye exam completed, report requested

## 2022-11-06 NOTE — Progress Notes (Signed)
Complete physical exam  Patient: Abigail [name]   DOB: [birth year]   52 y.o. Female  MRN: [id number] Visit Date: 11/07/2022  Subjective:    Chief Complaint  Patient presents with   Annual Exam    Fasting    Taisha Knuckles is a 52 y.o. female who presents today for a complete physical exam. She reports consuming a low fat and low sodium diet.  Exercise 1x/week (cardio and strength training)  She generally feels well. She reports sleeping well. She does have additional problems to discuss today.  Vision:Yes Dental:Yes STD Screen:No  Has appointment for colonoscopy 01/04/2023 with [facility]  BP Readings from Last 3 Encounters:  11/06/22 120/82  06/16/22 138/80  04/13/22 122/78   Wt Readings from Last 3 Encounters:  11/06/22 184 lb 3.2 oz (83.6 kg)  06/16/22 184 lb (83.5 kg)  04/13/22 184 lb 9.6 oz (83.7 kg)   Most recent fall risk assessment:    11/06/2022   10:32 AM  Fall Risk   Falls in the past year? 0  Number falls in past yr: 0  Injury with Fall? 0  Follow up Falls evaluation completed   Depression screen:Yes - Depression  Most recent depression screenings:    11/06/2022   10:32 AM 06/19/2022    8:29 AM  PHQ 2/9 Scores  PHQ - 2 Score 0      Information is confidential and restricted. Go to Review Flowsheets to unlock data.   HPI   DM (diabetes mellitus) (HCC) Repeat hgbA1c and lipid panel today BP at goal Eye exam completed, report requested   Hyperlipidemia associated with type 2 diabetes mellitus (HCC) Repeat lipid panel Advised about importance of heart healthy diet and daily exercise  Past Medical History:  Diagnosis Date   Allergy    Anemia    iron supp   Anxiety    COVID 05/2020   Depression    Fibroids    GERD (gastroesophageal reflux disease)    omeprazole   Headache(784.0)    Hypertension 2012   Maxide   S/P LEEP 1992   Urticaria    Past Surgical History:  Procedure Laterality Date   ABDOMINAL HYSTERECTOMY  2014   Inicision and  drainage of thrombosed hemorrhoid  04/06/2018   LEEP  1998   Abnormal pap   SUPRACERVICAL ABDOMINAL HYSTERECTOMY  01/18/2012   Procedure: HYSTERECTOMY SUPRACERVICAL ABDOMINAL with LSO;  Surgeon: Tereso Newcomer, MD;  Location: [facility];  Service: Gynecology;  Laterality: N/A;   TUBAL LIGATION  1997   WISDOM TOOTH EXTRACTION     Social History   Socioeconomic History   Marital status: Married    Spouse name: Not on file   Number of children: 2   Years of education: 14   Highest education level: Not on file  Occupational History   Occupation: MEDICAL ASSISTANT    Employer: [employer]  Tobacco Use   Smoking status: Never   Smokeless tobacco: Never  Vaping Use   Vaping Use: Never used  Substance and Sexual Activity   Alcohol use: Yes    Alcohol/week: 4.0 standard drinks of alcohol    Types: 3 Glasses of wine, 1 Standard drinks or equivalent per week    Comment: socially   Drug use: No   Sexual activity: Yes    Birth control/protection: Surgical    Comment: Hysterectomy  Other Topics Concern   Not on file  Social History Narrative   Regular exercise-no   Caffeine Use-yes   Social  Determinants of Health   Financial Resource Strain: Not on file  Food Insecurity: Not on file  Transportation Needs: Not on file  Physical Activity: Not on file  Stress: Not on file  Social Connections: Not on file  Intimate Partner Violence: Not on file   Family Status  Relation Name Status   Mother Jennette Dubin Deceased       fibrosis of lung   Father Rise Mu Alive   Sister Jesse Sans Alive   Sister Sherlane [address] Alive   Sister  Alive   Sister  Alive   Daughter Valentina Lucks (Not Specified)   MGM  Deceased   MGF  Deceased   PGM  Deceased       heart attack   PGF  Deceased   Cousin  (Not Specified)   Brother R Maisie Fus Alive   Brother  Alive   Neg Hx  (Not Specified)   Family History  Problem Relation Age of Onset   Hypertension Mother    Arthritis Mother     Pulmonary fibrosis Mother    Hypertension Father    Depression Father    Diabetes Father    Alcohol abuse Father    Colonic polyp Father    Diabetes Sister    Congestive Heart Failure Sister    Anxiety disorder Sister    Hypertension Sister    Arthritis Sister    Breast cancer Daughter 41   Cancer Daughter    Breast cancer Paternal Grandmother    Heart attack Paternal Grandmother    Breast cancer Cousin        1st cousin, paternal   Diabetes Brother    Allergic rhinitis Neg Hx    Angioedema Neg Hx    Asthma Neg Hx    Eczema Neg Hx    Immunodeficiency Neg Hx    Urticaria Neg Hx    Allergies  Allergen Reactions   Shellfish Allergy Itching and Swelling    Swollen tongue, itching   Tylox [Oxycodone-Acetaminophen] Itching   Tyloxapol     Patient Care Team: Natalina Wieting, Bonna Gains, NP as PCP - General (Internal Medicine) Ria Comment, FNP (Gynecology)   Medications: Outpatient Medications Prior to Visit  Medication Sig   amLODipine (NORVASC) 10 MG tablet Take 1 tablet (10 mg total) by mouth daily.   cyclobenzaprine (FLEXERIL) 10 MG tablet Take 1 tablet (10 mg total) by mouth 3 (three) times daily as needed for muscle spasms. (Patient not taking: Reported on 06/16/2022)   diphenhydrAMINE (BENADRYL) 25 MG tablet Take 25 mg by mouth as needed.   fluticasone (FLONASE) 50 MCG/ACT nasal spray SHAKE LIQUID AND USE 1 SPRAY IN EACH NOSTRIL DAILY   loratadine (CLARITIN) 10 MG tablet Take 10 mg by mouth daily.   losartan (COZAAR) 25 MG tablet TAKE 1 TABLET(25 MG) BY MOUTH DAILY   omeprazole (PRILOSEC) 20 MG capsule Take 1 capsule (20 mg total) by mouth daily.   propranolol (INDERAL) 10 MG tablet Take 1 tablet (10 mg total) by mouth 3 (three) times daily as needed.   SUMAtriptan (IMITREX) 25 MG tablet Take 1-2 tablets (25-50 mg total) by mouth every 2 (two) hours as needed for migraine. May repeat in 2 hours if headache persists or recurs. No more than 100mg  in 24hrs   triamcinolone  ointment (KENALOG) 0.5 % Apply 1 application topically 2 (two) times daily. You may use for 2 weeks at a time as needed.   Vitamin D, Ergocalciferol, 2000 units CAPS Take 1 capsule by  mouth daily after breakfast.   No facility-administered medications prior to visit.    Review of Systems  Constitutional:  Negative for activity change, appetite change and unexpected weight change.  Respiratory: Negative.    Cardiovascular: Negative.   Gastrointestinal: Negative.   Endocrine: Negative for cold intolerance and heat intolerance.  Genitourinary: Negative.   Musculoskeletal: Negative.   Skin: Negative.   Neurological: Negative.   Hematological: Negative.   Psychiatric/Behavioral:  Negative for behavioral problems, decreased concentration, dysphoric mood, hallucinations, self-injury, sleep disturbance and suicidal ideas. The patient is not nervous/anxious.         Objective:  BP 120/82 (BP Location: Left Arm, Patient Position: Sitting, Cuff Size: Large)   Pulse 78   Temp 97.6 F (36.4 C) (Temporal)   Resp 16   Ht 5\' 6"  (1.676 m)   Wt 184 lb 3.2 oz (83.6 kg)   LMP 01/18/2012 (Exact Date)   SpO2 99%   BMI 29.73 kg/m     Physical Exam   Results for orders placed or performed in visit on 11/06/22  [facility] DIABETES EYE EXAM  Result Value Ref Range   HM Diabetic Eye Exam No Retinopathy No Retinopathy  Results for orders placed or performed in visit on 11/06/22  Lipid panel  Result Value Ref Range   Cholesterol 234 (H) 0 - 200 mg/dL   Triglycerides 782.9 0.0 - 149.0 mg/dL   HDL 56.21 >30.86 mg/dL   VLDL 57.8 0.0 - 46.9 mg/dL   LDL Cholesterol 629 (H) 0 - 99 mg/dL   Total CHOL/HDL Ratio 4    NonHDL 177.70   Hemoglobin A1c  Result Value Ref Range   Hgb A1c MFr Bld 6.2 4.6 - 6.5 %  Comprehensive metabolic panel  Result Value Ref Range   Sodium 140 135 - 145 mEq/L   Potassium 4.5 3.5 - 5.1 mEq/L   Chloride 102 96 - 112 mEq/L   CO2 32 19 - 32 mEq/L   Glucose, Bld 96 70 - 99 mg/dL    BUN 14 6 - 23 mg/dL   Creatinine, Ser 5.28 0.40 - 1.20 mg/dL   Total Bilirubin 0.4 0.2 - 1.2 mg/dL   Alkaline Phosphatase 69 39 - 117 U/L   AST 19 0 - 37 U/L   ALT 16 0 - 35 U/L   Total Protein 7.4 6.0 - 8.3 g/dL   Albumin 4.3 3.5 - 5.2 g/dL   GFR 41.32 >44.01 mL/min   Calcium 10.1 8.4 - 10.5 mg/dL      Assessment & Plan:    Routine Health Maintenance and Physical Exam  Immunization History  Administered Date(s) Administered   Hepatitis B 02/23/2018   Hepb-cpg 02/23/2018   Influenza,inj,Quad PF,6+ Mos 03/20/2020, 02/16/2022   Influenza-Unspecified 03/11/2012, 04/24/2016   PFIZER(Purple Top)SARS-COV-2 Vaccination 08/30/2018, 08/09/2019   PPD Test 02/23/2018   Tdap 05/11/2005, 04/10/2014    Health Maintenance  Topic Date Due   Colonoscopy  Never done   Zoster Vaccines- Shingrix (1 of 2) 02/06/2023 (Originally 10/04/2019)   COVID-19 Vaccine (3 - 2023-24 season) 04/29/2032 (Originally 01/09/2022)   INFLUENZA VACCINE  12/10/2022   Diabetic kidney evaluation - Urine ACR  03/14/2023   MAMMOGRAM  03/24/2023   HEMOGLOBIN A1C  05/08/2023   PAP SMEAR-Modifier  07/27/2023   OPHTHALMOLOGY EXAM  09/28/2023   Diabetic kidney evaluation - eGFR measurement  11/06/2023   FOOT EXAM  11/06/2023   DTaP/Tdap/Td (3 - Td or Tdap) 04/10/2024   Hepatitis C Screening  Completed   HIV  Screening  Completed   HPV VACCINES  Aged Out    Discussed health benefits of physical activity, and encouraged her to engage in regular exercise appropriate for her age and condition.  Problem List Items Addressed This Visit       Endocrine   DM (diabetes mellitus) (HCC)    Repeat hgbA1c and lipid panel today BP at goal Eye exam completed, report requested       Relevant Medications   pravastatin (PRAVACHOL) 20 MG tablet   Other Relevant Orders   Hemoglobin A1c (Completed)   Hyperlipidemia associated with type 2 diabetes mellitus (HCC)    Repeat lipid panel Advised about importance of heart healthy  diet and daily exercise      Relevant Medications   pravastatin (PRAVACHOL) 20 MG tablet   Other Relevant Orders   Lipid panel (Completed)   Other Visit Diagnoses     Encounter for preventative adult health care exam with abnormal findings    -  Primary   Relevant Orders   Comprehensive metabolic panel (Completed)      Return in about 6 months (around 05/08/2023) for HTN, DM, hyperlipidemia (fasting).     Alysia Penna, NP

## 2022-11-06 NOTE — Assessment & Plan Note (Signed)
Repeat lipid panel Advised about importance of heart healthy diet and daily exercise 

## 2022-11-06 NOTE — Patient Instructions (Addendum)
Go to lab Continue Heart healthy diet and daily exercise. Maintain current medications. Ok to use senna or dulcolax or magnesium glycinate OVER THE COUNTER for constipation  Preventive Care 75-53 Years Old, Female Preventive care refers to lifestyle choices and visits with your health care provider that can promote health and wellness. Preventive care visits are also called wellness exams. What can I expect for my preventive care visit? Counseling Your health care provider may ask you questions about your: Medical history, including: Past medical problems. Family medical history. Pregnancy history. Current health, including: Menstrual cycle. Method of birth control. Emotional well-being. Home life and relationship well-being. Sexual activity and sexual health. Lifestyle, including: Alcohol, nicotine or tobacco, and drug use. Access to firearms. Diet, exercise, and sleep habits. Work and work Astronomer. Sunscreen use. Safety issues such as seatbelt and bike helmet use. Physical exam Your health care provider will check your: Height and weight. These may be used to calculate your BMI (body mass index). BMI is a measurement that tells if you are at a healthy weight. Waist circumference. This measures the distance around your waistline. This measurement also tells if you are at a healthy weight and may help predict your risk of certain diseases, such as type 2 diabetes and high blood pressure. Heart rate and blood pressure. Body temperature. Skin for abnormal spots. What immunizations do I need?  Vaccines are usually given at various ages, according to a schedule. Your health care provider will recommend vaccines for you based on your age, medical history, and lifestyle or other factors, such as travel or where you work. What tests do I need? Screening Your health care provider may recommend screening tests for certain conditions. This may include: Lipid and cholesterol  levels. Diabetes screening. This is done by checking your blood sugar (glucose) after you have not eaten for a while (fasting). Pelvic exam and Pap test. Hepatitis B test. Hepatitis C test. HIV (human immunodeficiency virus) test. STI (sexually transmitted infection) testing, if you are at risk. Lung cancer screening. Colorectal cancer screening. Mammogram. Talk with your health care provider about when you should start having regular mammograms. This may depend on whether you have a family history of breast cancer. BRCA-related cancer screening. This may be done if you have a family history of breast, ovarian, tubal, or peritoneal cancers. Bone density scan. This is done to screen for osteoporosis. Talk with your health care provider about your test results, treatment options, and if necessary, the need for more tests. Follow these instructions at home: Eating and drinking  Eat a diet that includes fresh fruits and vegetables, whole grains, lean protein, and low-fat dairy products. Take vitamin and mineral supplements as recommended by your health care provider. Do not drink alcohol if: Your health care provider tells you not to drink. You are pregnant, may be pregnant, or are planning to become pregnant. If you drink alcohol: Limit how much you have to 0-1 drink a day. Know how much alcohol is in your drink. In the [place] one drink equals one 12 oz bottle of beer (355 mL), one 5 oz glass of wine (148 mL), or one 1 oz glass of hard liquor (44 mL). Lifestyle Brush your teeth every morning and night with fluoride toothpaste. Floss one time each day. Exercise for at least 30 minutes 5 or more days each week. Do not use any products that contain nicotine or tobacco. These products include cigarettes, chewing tobacco, and vaping devices, such as e-cigarettes. If you need  help quitting, ask your health care provider. Do not use drugs. If you are sexually active, practice safe sex. Use a  condom or other form of protection to prevent STIs. If you do not wish to become pregnant, use a form of birth control. If you plan to become pregnant, see your health care provider for a prepregnancy visit. Take aspirin only as told by your health care provider. Make sure that you understand how much to take and what form to take. Work with your health care provider to find out whether it is safe and beneficial for you to take aspirin daily. Find healthy ways to manage stress, such as: Meditation, yoga, or listening to music. Journaling. Talking to a trusted person. Spending time with friends and family. Minimize exposure to UV radiation to reduce your risk of skin cancer. Safety Always wear your seat belt while driving or riding in a vehicle. Do not drive: If you have been drinking alcohol. Do not ride with someone who has been drinking. When you are tired or distracted. While texting. If you have been using any mind-altering substances or drugs. Wear a helmet and other protective equipment during sports activities. If you have firearms in your house, make sure you follow all gun safety procedures. Seek help if you have been physically or sexually abused. What's next? Visit your health care provider once a year for an annual wellness visit. Ask your health care provider how often you should have your eyes and teeth checked. Stay up to date on all vaccines. This information is not intended to replace advice given to you by your health care provider. Make sure you discuss any questions you have with your health care provider. Document Revised: 10/23/2020 Document Reviewed: 10/23/2020 Elsevier Patient Education  2024 [employer].

## 2022-11-07 ENCOUNTER — Encounter: Payer: Self-pay | Admitting: Nurse Practitioner

## 2022-11-07 MED ORDER — PRAVASTATIN SODIUM 20 MG PO TABS: 20.0000 mg | ORAL_TABLET | Freq: Every day | ORAL | 3 refills | Status: DC

## 2022-11-07 NOTE — Addendum Note (Signed)
Addended by: Alysia Penna L on: 11/07/2022 09:09 AM   Modules accepted: Orders

## 2022-11-19 ENCOUNTER — Telehealth (HOSPITAL_COMMUNITY): Payer: 59 | Admitting: Psychiatry

## 2022-11-24 ENCOUNTER — Telehealth (HOSPITAL_COMMUNITY): Payer: 59 | Admitting: Psychiatry

## 2022-12-31 NOTE — Progress Notes (Signed)
BH MD/PA/NP OP Progress Note  01/04/2023 8:08 AM Abigail [name]  MRN:  [id number]  Visit Diagnosis:    ICD-10-CM   1. Erroneous encounter - disregard  ERROR EN        Assessment: Abigail [name] is a 52 y.o. female with a history of GAD, Vitamin D deficiency  who presented to [facility] for initial evaluation on 06/19/2022.  During initial evaluation patient reported experiencing symptoms of adrenaline rush, racing heart, flushed feeling, occasional blood pressure elevations, and feelings of "antsiness" that have been occurring for around a year now.  Symptoms occur secondary to stressful, emotional, or exciting situations and can lead to some minor anxiety that there is something wrong with her.  Episodes resolve after she is able to breathe for a few minutes.  Of note patient has a past history of panic attacks which presented with palpitations, fatigue, hypertension, and fear of something awful happening.  She describes these current episodes as different from her prior panic attacks.  Patient denied any depressed mood, anxiety, psychosis, paranoia, delusions, or history of mania.  She did endorse a past history of trauma though denies symptoms or symptoms consistent with PTSD.  Patient met criteria for panic disorder.  Abigail [name] presents for follow-up evaluation. Today, 01/04/23, patient reports    increased improvement in her symptoms over the past month after increasing propranolol and using it more frequently.  The episodes of drooling can still occur though are a bit less frequent and have not occurred during work.  She also has not had any panic attacks in the past month and a half.  Patient denies any adverse side effects from the medication.  We will continue on her current regimen and follow-up in 2 to 3 months.  Plan: - Continue Propranolol 10 mg TID prn anxiety  - Can consider therapy in the future - Crisis resources reviewed - Follow up in  2-3 months   Chief Complaint:  Chief Complaint  Patient presents with  . Follow-up  . Error    HPI: Abigail [name] presents reporting the past 2 months    and a half have gone pretty well and she is doing okay.  She has decreased the propranolol and is taking it more regularly and thinks it has been helping some.  She has not had any more palpitations in her sleep and the episodes of adrenaline rush/palpitations have been less frequent.  She denies any adverse side effects from the medication.  Patient notes that she can still get a bit antsy or over excited about something that she finds enjoyable such as a good movie at which time she will take propranolol and I will help her calm down.  She denies any episodes of work since last visit however also denies any triggers that could cause an episode.  Patient also denies any panic attacks since the last visit.  We discussed treatment options including increasing propranolol, adding a long-acting agent for anxiety, or staying on the current dose.  Based off her current level improvement patient was open to continuing on her current regimen.  Past Psychiatric History: Denies any prior psychiatric care until starting BuSpar from her PCP recently. Had been diagnosed with depression in the past which improved as her personal life  Has tried Buspar 5 mg TID, Ambien, Zoloft, and possibly fluoxetine (head hurt, joint aches)  Started propranolol on 06/19/2022  Substance use uses alcohol once or twice a week. Denies any othe substance  Past  Medical History:  Past Medical History:  Diagnosis Date  . Allergy   . Anemia    iron supp  . Anxiety   . COVID 05/2020  . Depression   . Fibroids   . GERD (gastroesophageal reflux disease)    omeprazole  . Headache(784.0)   . Hypertension 2012   Maxide  . S/P LEEP 1992  . Urticaria     Past Surgical History:  Procedure Laterality Date  . ABDOMINAL HYSTERECTOMY  2014  . Inicision and drainage of thrombosed  hemorrhoid  04/06/2018  . LEEP  1998   Abnormal pap  . SUPRACERVICAL ABDOMINAL HYSTERECTOMY  01/18/2012   Procedure: HYSTERECTOMY SUPRACERVICAL ABDOMINAL with LSO;  Surgeon: Tereso Newcomer, MD;  Location: [facility];  Service: Gynecology;  Laterality: N/A;  . TUBAL LIGATION  1997  . WISDOM TOOTH EXTRACTION      Family History:  Family History  Problem Relation Age of Onset  . Hypertension Mother   . Arthritis Mother   . Pulmonary fibrosis Mother   . Hypertension Father   . Depression Father   . Diabetes Father   . Alcohol abuse Father   . Colonic polyp Father   . Diabetes Sister   . Congestive Heart Failure Sister   . Anxiety disorder Sister   . Hypertension Sister   . Arthritis Sister   . Breast cancer Daughter 63  . Cancer Daughter   . Breast cancer Paternal Grandmother   . Heart attack Paternal Grandmother   . Breast cancer Cousin        1st cousin, paternal  . Diabetes Brother   . Allergic rhinitis Neg Hx   . Angioedema Neg Hx   . Asthma Neg Hx   . Eczema Neg Hx   . Immunodeficiency Neg Hx   . Urticaria Neg Hx     Social History:  Social History   Socioeconomic History  . Marital status: Married    Spouse name: Not on file  . Number of children: 2  . Years of education: 36  . Highest education level: Not on file  Occupational History  . Occupation: MEDICAL ASSISTANT    Employer: [employer]  Tobacco Use  . Smoking status: Never  . Smokeless tobacco: Never  Vaping Use  . Vaping status: Never Used  Substance and Sexual Activity  . Alcohol use: Yes    Alcohol/week: 4.0 standard drinks of alcohol    Types: 3 Glasses of wine, 1 Standard drinks or equivalent per week    Comment: socially  . Drug use: No  . Sexual activity: Yes    Birth control/protection: Surgical    Comment: Hysterectomy  Other Topics Concern  . Not on file  Social History Narrative   Regular exercise-no   Caffeine Use-yes   Social Determinants of Health   Financial  Resource Strain: Not on file  Food Insecurity: Not on file  Transportation Needs: Not on file  Physical Activity: Not on file  Stress: Not on file  Social Connections: Not on file    Allergies:  Allergies  Allergen Reactions  . Shellfish Allergy Itching and Swelling    Swollen tongue, itching  . Tylox [Oxycodone-Acetaminophen] Itching  . Tyloxapol     Current Medications: Current Outpatient Medications  Medication Sig Dispense Refill  . amLODipine (NORVASC) 10 MG tablet Take 1 tablet (10 mg total) by mouth daily. 30 tablet 0  . cyclobenzaprine (FLEXERIL) 10 MG tablet Take 1 tablet (10 mg total) by mouth 3 (three)  times daily as needed for muscle spasms. (Patient not taking: Reported on 06/16/2022) 30 tablet 0  . diphenhydrAMINE (BENADRYL) 25 MG tablet Take 25 mg by mouth as needed.    . fluticasone (FLONASE) 50 MCG/ACT nasal spray SHAKE LIQUID AND USE 1 SPRAY IN EACH NOSTRIL DAILY 16 g 5  . loratadine (CLARITIN) 10 MG tablet Take 10 mg by mouth daily.    Marland Kitchen losartan (COZAAR) 25 MG tablet TAKE 1 TABLET(25 MG) BY MOUTH DAILY 90 tablet 3  . omeprazole (PRILOSEC) 20 MG capsule Take 1 capsule (20 mg total) by mouth daily. 90 capsule 3  . pravastatin (PRAVACHOL) 20 MG tablet Take 1 tablet (20 mg total) by mouth daily. 90 tablet 3  . propranolol (INDERAL) 10 MG tablet Take 1 tablet (10 mg total) by mouth 3 (three) times daily as needed. 270 tablet 1  . SUMAtriptan (IMITREX) 25 MG tablet Take 1-2 tablets (25-50 mg total) by mouth every 2 (two) hours as needed for migraine. May repeat in 2 hours if headache persists or recurs. No more than 100mg  in 24hrs 10 tablet 0  . triamcinolone ointment (KENALOG) 0.5 % Apply 1 application topically 2 (two) times daily. You may use for 2 weeks at a time as needed. 30 g 0  . Vitamin D, Ergocalciferol, 2000 units CAPS Take 1 capsule by mouth daily after breakfast. 90 capsule 1   No current facility-administered medications for this visit.     Psychiatric  Specialty Exam: Review of Systems  Last menstrual period 01/18/2012.There is no height or weight on file to calculate BMI.  General Appearance: Well Groomed  Eye Contact:  Good  Speech:  Clear and Coherent and Normal Rate  Volume:  Normal  Mood:  Euthymic  Affect:  Appropriate  Thought Process:  Coherent, Goal Directed, and Linear  Orientation:  Full (Time, Place, and Person)  Thought Content: Logical   Suicidal Thoughts:  No  Homicidal Thoughts:  No  Memory:  Immediate;   Good  Judgement:  Good  Insight:  Fair  Psychomotor Activity:  Normal  Concentration:  Concentration: Good  Recall:  Good  Fund of Knowledge: Good  Language: Good  Akathisia:  NA    AIMS (if indicated): not done  Assets:  Communication Skills Desire for Improvement Financial Resources/Insurance Housing Resilience Social Support Talents/Skills Transportation Vocational/Educational  ADL's:  Intact  Cognition: WNL  Sleep:  Good   Metabolic Disorder Labs: Lab Results  Component Value Date   HGBA1C 6.2 11/06/2022   MPG 114 07/28/2016   No results found for: "PROLACTIN" Lab Results  Component Value Date   CHOL 234 (H) 11/06/2022   TRIG 128.0 11/06/2022   HDL 55.90 11/06/2022   CHOLHDL 4 11/06/2022   VLDL 25.6 11/06/2022   LDLCALC 152 (H) 11/06/2022   LDLCALC 134 (H) 08/18/2021   Lab Results  Component Value Date   TSH 0.72 06/16/2022   TSH 0.47 08/13/2020    Therapeutic Level Labs: No results found for: "LITHIUM" No results found for: "VALPROATE" No results found for: "CBMZ"   Screenings: GAD-7   Flowsheet Row [facility] Office Visit from 08/18/2021 in [facility] [employer] [facility] Visit from 12/20/2020 in [facility] Visit from 07/27/2017 in [facility] [employer]  Total GAD-7 Score 0 0 4 4    PHQ2-9    Flowsheet Row Office Visit from 11/06/2022 in [facility] Visit from 06/19/2022 in [facility] Office Visit from 06/16/2022 in [facility] Visit from 04/13/2022 in [facility] Visit from 02/16/2022 in [facility] [employer]  PHQ-2 Total Score 0 0 0 0 0      Collaboration of Care: Collaboration of Care: Medication Management AEB medication prescription  Patient/Guardian was advised Release of Information must be obtained prior to any record release in order to collaborate their care with an outside provider. Patient/Guardian was advised if they have not already done so to contact the registration department to sign all necessary forms in order for [place] to release information regarding their care.   Consent: Patient/Guardian gives verbal consent for treatment and assignment of benefits for services provided during this visit. Patient/Guardian expressed understanding and agreed to proceed.    Stasia Cavalier, MD 01/04/2023, 8:08 AM   Virtual Visit via Video Note  I connected with Abigail [name] on 01/04/23 at 11:00 AM EDT by a video enabled telemedicine application and verified that I am speaking with the correct person using two identifiers.  Location: Patient: Home Provider: Home Office   I discussed the limitations of evaluation and management by telemedicine and the availability of in person appointments. The patient expressed understanding and agreed to proceed.   I discussed the assessment and treatment plan with the patient. The patient was provided an opportunity to ask questions and all were answered. The patient agreed with the plan and demonstrated an understanding of the instructions.   The patient was advised to call back or seek an in-person evaluation if the symptoms worsen or if  the condition fails to improve as anticipated.  I provided 15 minutes of non-face-to-face time during this encounter.   Stasia Cavalier, MD  This encounter was created in error - please disregard.

## 2023-01-01 ENCOUNTER — Encounter (HOSPITAL_COMMUNITY): Payer: 59 | Admitting: Psychiatry

## 2023-01-04 ENCOUNTER — Ambulatory Visit: Payer: 59 | Admitting: Nurse Practitioner

## 2023-01-12 ENCOUNTER — Ambulatory Visit: Payer: 59 | Admitting: Obstetrics and Gynecology

## 2023-01-12 ENCOUNTER — Encounter: Payer: Self-pay | Admitting: Obstetrics and Gynecology

## 2023-01-12 ENCOUNTER — Encounter: Payer: Self-pay | Admitting: Nurse Practitioner

## 2023-01-12 ENCOUNTER — Other Ambulatory Visit: Payer: Self-pay | Admitting: Nurse Practitioner

## 2023-01-12 ENCOUNTER — Other Ambulatory Visit: Payer: Self-pay

## 2023-01-12 VITALS — BP 128/88 | HR 64 | Ht 65.5 in | Wt 181.0 lb

## 2023-01-12 DIAGNOSIS — N951 Menopausal and female climacteric states: Secondary | ICD-10-CM | POA: Diagnosis not present

## 2023-01-12 DIAGNOSIS — R102 Pelvic and perineal pain: Secondary | ICD-10-CM

## 2023-01-12 DIAGNOSIS — R350 Frequency of micturition: Secondary | ICD-10-CM | POA: Diagnosis not present

## 2023-01-12 DIAGNOSIS — R3915 Urgency of urination: Secondary | ICD-10-CM

## 2023-01-12 DIAGNOSIS — I10 Essential (primary) hypertension: Secondary | ICD-10-CM

## 2023-01-12 LAB — WET PREP FOR TRICH, YEAST, CLUE

## 2023-01-12 MED ORDER — ESTRADIOL 0.05 MG/24HR TD PTTW: 1.0000 | MEDICATED_PATCH | TRANSDERMAL | 2 refills | Status: DC

## 2023-01-12 NOTE — Progress Notes (Signed)
GYNECOLOGY  VISIT   HPI: 52 y.o.   Married  [place]  female   [phone] with Patient's last menstrual period was 01/18/2012 (exact date).   here for   UTI-urinary urgency and frequency, pelvic pain (symptoms have improved). Can void small amounts.  No blood in urine.  No back pain.  No fevers.   Some nausea, couple of days per week.   Some discharge.  No odor.  No itching.   Having a lot of hot flashes.  Had headache with use of Effexor.   FSH 58.3 on 10/13/21.   OTC herbal options do not help much.   No new products.  No partner change.   GYNECOLOGIC HISTORY: Patient's last menstrual period was 01/18/2012 (exact date). Contraception:  hyst Menopausal hormone therapy:  n/a Last mammogram:  03/23/22 Breast Density cat C, BI-RADS CAT 2 benign Last pap smear:   07/26/20 neg: HR HPV neg        OB History     Gravida  3   Para  2   Term  2   Preterm  0   AB  1   Living  2      SAB  1   IAB  0   Ectopic  0   Multiple  0   Live Births  2              Patient Active Problem List   Diagnosis Date Noted   Hyperlipidemia associated with type 2 diabetes mellitus (HCC) 11/06/2022   Panic disorder 06/19/2022   Urticaria due to food allergy 08/27/2020   Chronic idiopathic constipation 08/29/2019   Intermittent palpitations 11/29/2018   Migraine without aura and without status migrainosus, not intractable 10/19/2018   Chronic right-sided low back pain without sciatica 12/13/2017   Vitamin D insufficiency 07/27/2017   Seasonal allergic rhinitis 07/27/2017   Gallop rhythm 06/25/2017   Generalized anxiety disorder 04/10/2016   DM (diabetes mellitus) (HCC) 04/10/2016   GERD (gastroesophageal reflux disease) 04/10/2016   Environmental allergies 06/17/2012   S/p Abdominal Supracervical Hysterectomy, Left Salpingoohorectomy, Right Salpingectomy on 01/18/12 01/18/2012   HTN (hypertension) 10/29/2011    Past Medical History:  Diagnosis Date   Allergy     Anemia    iron supp   Anxiety    COVID 05/2020   Depression    Fibroids    GERD (gastroesophageal reflux disease)    omeprazole   Headache(784.0)    Hypertension 2012   Maxide   S/P LEEP 1992   Urticaria     Past Surgical History:  Procedure Laterality Date   ABDOMINAL HYSTERECTOMY  2014   Inicision and drainage of thrombosed hemorrhoid  04/06/2018   LEEP  1998   Abnormal pap   SUPRACERVICAL ABDOMINAL HYSTERECTOMY  01/18/2012   Procedure: HYSTERECTOMY SUPRACERVICAL ABDOMINAL with LSO;  Surgeon: Tereso Newcomer, MD;  Location: [facility];  Service: Gynecology;  Laterality: N/A;   TUBAL LIGATION  1997   WISDOM TOOTH EXTRACTION      Current Outpatient Medications  Medication Sig Dispense Refill   amLODipine (NORVASC) 10 MG tablet Take 1 tablet (10 mg total) by mouth daily. 30 tablet 0   cyclobenzaprine (FLEXERIL) 10 MG tablet Take 1 tablet (10 mg total) by mouth 3 (three) times daily as needed for muscle spasms. 30 tablet 0   diphenhydrAMINE (BENADRYL) 25 MG tablet Take 25 mg by mouth as needed.     fluticasone (FLONASE) 50 MCG/ACT nasal spray SHAKE LIQUID AND USE  1 SPRAY IN EACH NOSTRIL DAILY 16 g 5   loratadine (CLARITIN) 10 MG tablet Take 10 mg by mouth daily.     losartan (COZAAR) 25 MG tablet TAKE 1 TABLET(25 MG) BY MOUTH DAILY 90 tablet 3   omeprazole (PRILOSEC) 20 MG capsule Take 1 capsule (20 mg total) by mouth daily. 90 capsule 3   pravastatin (PRAVACHOL) 20 MG tablet Take 1 tablet (20 mg total) by mouth daily. 90 tablet 3   propranolol (INDERAL) 10 MG tablet Take 1 tablet (10 mg total) by mouth 3 (three) times daily as needed. 270 tablet 1   SUMAtriptan (IMITREX) 25 MG tablet Take 1-2 tablets (25-50 mg total) by mouth every 2 (two) hours as needed for migraine. May repeat in 2 hours if headache persists or recurs. No more than 100mg  in 24hrs 10 tablet 0   triamcinolone ointment (KENALOG) 0.5 % Apply 1 application topically 2 (two) times daily. You may use for 2 weeks at  a time as needed. 30 g 0   Vitamin D, Ergocalciferol, 2000 units CAPS Take 1 capsule by mouth daily after breakfast. 90 capsule 1   No current facility-administered medications for this visit.     ALLERGIES: Shellfish allergy, Tylox [oxycodone-acetaminophen], and Tyloxapol  Family History  Problem Relation Age of Onset   Hypertension Mother    Arthritis Mother    Pulmonary fibrosis Mother    Hypertension Father    Depression Father    Diabetes Father    Alcohol abuse Father    Colonic polyp Father    Diabetes Sister    Congestive Heart Failure Sister    Anxiety disorder Sister    Hypertension Sister    Arthritis Sister    Breast cancer Daughter 85   Cancer Daughter    Breast cancer Paternal Grandmother    Heart attack Paternal Grandmother    Breast cancer Cousin        1st cousin, paternal   Diabetes Brother    Allergic rhinitis Neg Hx    Angioedema Neg Hx    Asthma Neg Hx    Eczema Neg Hx    Immunodeficiency Neg Hx    Urticaria Neg Hx     Social History   Socioeconomic History   Marital status: Married    Spouse name: Not on file   Number of children: 2   Years of education: 14   Highest education level: Not on file  Occupational History   Occupation: MEDICAL ASSISTANT    Employer: [employer]  Tobacco Use   Smoking status: Never   Smokeless tobacco: Never  Vaping Use   Vaping status: Never Used  Substance and Sexual Activity   Alcohol use: Yes    Alcohol/week: 4.0 standard drinks of alcohol    Types: 3 Glasses of wine, 1 Standard drinks or equivalent per week    Comment: socially   Drug use: No   Sexual activity: Yes    Birth control/protection: Surgical    Comment: Hysterectomy  Other Topics Concern   Not on file  Social History Narrative   Regular exercise-no   Caffeine Use-yes   Social Determinants of Health   Financial Resource Strain: Not on file  Food Insecurity: Not on file  Transportation Needs: Not on file  Physical Activity:  Not on file  Stress: Not on file  Social Connections: Not on file  Intimate Partner Violence: Not on file    Review of Systems  Genitourinary:  Positive for frequency, pelvic pain  and urgency.    PHYSICAL EXAMINATION:    BP 128/88 (BP Location: Left Arm, Patient Position: Sitting, Cuff Size: Normal)   Pulse 64   Ht 5' 5.5" (1.664 m)   Wt 181 lb (82.1 kg)   LMP 01/18/2012 (Exact Date)   SpO2 100%   BMI 29.66 kg/m     General appearance: alert, cooperative and appears stated age   Pelvic: External genitalia:  no lesions              Urethra:  normal appearing urethra with no masses, tenderness or lesions              Bartholins and Skenes: normal                 Vagina: normal appearing vagina with normal color and discharge, no lesions              Cervix: no lesions                Bimanual Exam:  Uterus:  normal size, contour, position, consistency, mobility, non-tender              Adnexa: no mass, fullness, tenderness            Chaperone was present for exam:  Ladona Ridgel, CMA  ASSESSMENT  Urinary frequency.   Pelvic pain.  Menopausal symptoms.  PLAN  Urinalysis:  sg 1.026, ph 5.5, 0 - 5 WBC, 0 - 2 RBC, few bacteria, moderate mucus.  Reflex UC.  Wet prep:  negative.  Declines Rx for UTI and will wait final UC to finalize.  Vivelle Dot 0.05 mg twice a week. #8, RF 2.  Fu for recheck and annual exam in 10 weeks for a recheck and annual exam.   24 min  total time was spent for this patient encounter, including preparation, face-to-face counseling with the patient, coordination of care, and documentation of the encounter.

## 2023-01-14 LAB — URINALYSIS, COMPLETE W/RFL CULTURE
Glucose, UA: NEGATIVE
Hyaline Cast: NONE SEEN /LPF
Leukocyte Esterase: NEGATIVE
Nitrites, Initial: NEGATIVE
Protein, ur: NEGATIVE
Specific Gravity, Urine: 1.026 (ref 1.001–1.035)
pH: 5.5 (ref 5.0–8.0)

## 2023-01-14 LAB — URINE CULTURE
MICRO NUMBER:: 15412981
SPECIMEN QUALITY:: ADEQUATE

## 2023-01-14 LAB — CULTURE INDICATED

## 2023-02-06 ENCOUNTER — Other Ambulatory Visit: Payer: Self-pay | Admitting: Nurse Practitioner

## 2023-02-06 DIAGNOSIS — I10 Essential (primary) hypertension: Secondary | ICD-10-CM

## 2023-02-17 ENCOUNTER — Encounter: Payer: Self-pay | Admitting: Nurse Practitioner

## 2023-02-17 ENCOUNTER — Ambulatory Visit: Payer: 59 | Admitting: Nurse Practitioner

## 2023-02-17 VITALS — BP 122/80 | HR 71 | Temp 98.3°F | Wt 185.6 lb

## 2023-02-17 DIAGNOSIS — H1031 Unspecified acute conjunctivitis, right eye: Secondary | ICD-10-CM | POA: Diagnosis not present

## 2023-02-17 MED ORDER — CIPROFLOXACIN HCL 0.3 % OP SOLN: 1.0000 [drp] | OPHTHALMIC | 0 refills | Status: DC

## 2023-02-17 MED ORDER — CIPROFLOXACIN HCL 0.3 % OP SOLN: 1.0000 [drp] | OPHTHALMIC | 0 refills | Status: AC

## 2023-02-17 NOTE — Patient Instructions (Signed)
Stop polymixin Start cipro Do not use contact lens till treatment in completed and symptoms resolved  Bacterial Conjunctivitis, Adult Bacterial conjunctivitis is an infection of the clear membrane that covers the white part of the eye and the inner surface of the eyelid (conjunctiva). When the blood vessels in the conjunctiva become inflamed, the eye becomes red or pink. The eye often feels irritated or itchy. Bacterial conjunctivitis spreads easily from person to person (is contagious). It also spreads easily from one eye to the other eye. What are the causes? This condition is caused by bacteria. You may get the infection if you come into close contact with: A person who is infected with the bacteria. Items that are contaminated with the bacteria, such as a face towel, contact lens solution, or eye makeup. What increases the risk? You are more likely to develop this condition if: You are exposed to other people who have the infection. You wear contact lenses. You have a sinus infection. You have had a recent eye injury or surgery. You have a weak body defense system (immune system). You have a medical condition that causes dry eyes. What are the signs or symptoms? Symptoms of this condition include: Thick, yellowish discharge from the eye. This may turn into a crust on the eyelid overnight and cause your eyelids to stick together. Tearing or watery eyes. Itchy eyes. Burning feeling in your eyes. Eye redness. Swollen eyelids. Blurred vision. How is this diagnosed? This condition is diagnosed based on your symptoms and medical history. Your health care provider may also take a sample of discharge from your eye to find the cause of your infection. How is this treated? This condition may be treated with: Antibiotic eye drops or ointment to clear the infection more quickly and prevent the spread of infection to others. Antibiotic medicines taken by mouth (orally) to treat infections that  do not respond to drops or ointments or that last longer than 10 days. Cool, wet cloths (cool compresses) placed on the eyes. Artificial tears applied 2-6 times a day. Follow these instructions at home: Medicines Take or apply your antibiotic medicine as told by your health care provider. Do not stop using the antibiotic, even if your condition improves, unless directed by your health care provider. Take or apply over-the-counter and prescription medicines only as told by your health care provider. Be very careful to avoid touching the edge of your eyelid with the eye-drop bottle or the ointment tube when you apply medicines to the affected eye. This will keep you from spreading the infection to your other eye or to other people. Managing discomfort Gently wipe away any drainage from your eye with a warm, wet washcloth or a cotton ball. Apply a clean, cool compress to your eye for 10-20 minutes, 3-4 times a day. General instructions Do not wear contact lenses until the inflammation is gone and your health care provider says it is safe to wear them again. Ask your health care provider how to sterilize or replace your contact lenses before you use them again. Wear glasses until you can resume wearing contact lenses. Avoid wearing eye makeup until the inflammation is gone. Throw away any old eye cosmetics that may be contaminated. Change or wash your pillowcase every day. Do not share towels or washcloths. This may spread the infection. Wash your hands often with soap and water for at least 20 seconds and especially before touching your face or eyes. Use paper towels to dry your hands. Avoid touching  or rubbing your eyes. Do not drive or use heavy machinery if your vision is blurred. Contact a health care provider if: You have a fever. Your symptoms do not get better after 10 days. Get help right away if: You have a fever and your symptoms suddenly get worse. You have severe pain when you move  your eye. You have facial pain, redness, or swelling. You have a sudden loss of vision. Summary Bacterial conjunctivitis is an infection of the clear membrane that covers the white part of the eye and the inner surface of the eyelid (conjunctiva). Bacterial conjunctivitis spreads easily from eye to eye and from person to person (is contagious). Wash your hands often with soap and water for at least 20 seconds and especially before touching your face or eyes. Use paper towels to dry your hands. Take or apply your antibiotic medicine as told by your health care provider. Do not stop using the antibiotic even if your condition improves. Contact a health care provider if you have a fever or if your symptoms do not get better after 10 days. Get help right away if you have a sudden loss of vision. This information is not intended to replace advice given to you by your health care provider. Make sure you discuss any questions you have with your health care provider. Document Revised: 08/07/2020 Document Reviewed: 08/07/2020 Elsevier Patient Education  2024 [employer].

## 2023-02-17 NOTE — Progress Notes (Signed)
Acute Office Visit  Subjective:    Patient ID: Abigail [name], female    DOB: [birth year], 52 y.o.   MRN: [id number]  Chief Complaint  Patient presents with   eye irritation    Left eye redness and pain x 2 days ago.  Tx with polytrim drops from UC from August for a day and is still present.     Eye Pain  The right eye is affected. This is a recurrent problem. The current episode started in the past 7 days. The problem occurs constantly. The problem has been unchanged. The injury mechanism was contact lenses. The pain is mild. There is No known exposure to pink eye. She Wears contacts. Associated symptoms include eye redness and a foreign body sensation. Pertinent negatives include no blurred vision, eye discharge, double vision, fever, itching, nausea, photophobia, recent URI or vomiting. She has tried eye drops for the symptoms. The treatment provided no relief.   Outpatient Medications Prior to Visit  Medication Sig   amLODipine (NORVASC) 10 MG tablet TAKE 1 TABLET(10 MG) BY MOUTH DAILY   diphenhydrAMINE (BENADRYL) 25 MG tablet Take 25 mg by mouth as needed.   estradiol (VIVELLE-DOT) 0.05 MG/24HR patch Place 1 patch (0.05 mg total) onto the skin 2 (two) times a week.   fluticasone (FLONASE) 50 MCG/ACT nasal spray SHAKE LIQUID AND USE 1 SPRAY IN EACH NOSTRIL DAILY   loratadine (CLARITIN) 10 MG tablet Take 10 mg by mouth daily.   losartan (COZAAR) 25 MG tablet TAKE 1 TABLET(25 MG) BY MOUTH DAILY   omeprazole (PRILOSEC) 20 MG capsule Take 1 capsule (20 mg total) by mouth daily.   propranolol (INDERAL) 10 MG tablet Take 1 tablet (10 mg total) by mouth 3 (three) times daily as needed.   SUMAtriptan (IMITREX) 25 MG tablet Take 1-2 tablets (25-50 mg total) by mouth every 2 (two) hours as needed for migraine. May repeat in 2 hours if headache persists or recurs. No more than 100mg  in 24hrs   triamcinolone ointment (KENALOG) 0.5 % Apply 1 application topically 2 (two) times daily. You may use for  2 weeks at a time as needed.   Vitamin D, Ergocalciferol, 2000 units CAPS Take 1 capsule by mouth daily after breakfast.   [DISCONTINUED] trimethoprim-polymyxin b (POLYTRIM) ophthalmic solution Place 1 drop into the right eye every 4 (four) hours.   pravastatin (PRAVACHOL) 20 MG tablet Take 1 tablet (20 mg total) by mouth daily. (Patient not taking: Reported on 02/17/2023)   [DISCONTINUED] cyclobenzaprine (FLEXERIL) 10 MG tablet Take 1 tablet (10 mg total) by mouth 3 (three) times daily as needed for muscle spasms. (Patient not taking: Reported on 02/17/2023)   No facility-administered medications prior to visit.    Reviewed past medical and social history.  Review of Systems  Constitutional:  Negative for fever.  Eyes:  Positive for pain and redness. Negative for blurred vision, double vision, photophobia, discharge and itching.  Gastrointestinal:  Negative for nausea and vomiting.   Per HPI     Objective:    Physical Exam Eyes:     General: Lids are normal.        Right eye: No foreign body, discharge or hordeolum.        Left eye: No foreign body, discharge or hordeolum.     Extraocular Movements: Extraocular movements intact.     Conjunctiva/sclera:     Right eye: Right conjunctiva is injected. Chemosis present. No exudate or hemorrhage.    Left eye: Left conjunctiva is  not injected. No chemosis, exudate or hemorrhage.   BP 122/80 (BP Location: Left Arm, Patient Position: Sitting, Cuff Size: Large)   Pulse 71   Temp 98.3 F (36.8 C) (Temporal)   Wt 185 lb 9.6 oz (84.2 kg)   LMP 01/18/2012 (Exact Date)   SpO2 99%   BMI 30.42 kg/m    No results found for any visits on 02/17/23.     Assessment & Plan:   Problem List Items Addressed This Visit   None Visit Diagnoses     Acute bacterial conjunctivitis of right eye    -  Primary   Relevant Medications   ciprofloxacin (CILOXAN) 0.3 % ophthalmic solution      Meds ordered this encounter  Medications   DISCONTD:  ciprofloxacin (CILOXAN) 0.3 % ophthalmic solution    Sig: Place 1 drop into the right eye every 4 (four) hours while awake for 7 days.    Dispense:  2.5 mL    Refill:  0    Order Specific Question:   Supervising Provider    Answer:   Nadene Rubins ALFRED [[id number]]   ciprofloxacin (CILOXAN) 0.3 % ophthalmic solution    Sig: Place 1 drop into the right eye every 4 (four) hours while awake for 7 days.    Dispense:  2.5 mL    Refill:  0  Stop polymixin. Start cipro Advised to Do not use contact lens till treatment in completed and symptoms resolved. Use warm compress prn.  Return if symptoms worsen or fail to improve.    Alysia Penna, NP

## 2023-02-23 ENCOUNTER — Other Ambulatory Visit: Payer: Self-pay | Admitting: Nurse Practitioner

## 2023-02-23 DIAGNOSIS — I10 Essential (primary) hypertension: Secondary | ICD-10-CM

## 2023-03-03 ENCOUNTER — Ambulatory Visit: Payer: 59 | Admitting: Nurse Practitioner

## 2023-03-03 ENCOUNTER — Encounter: Payer: Self-pay | Admitting: Nurse Practitioner

## 2023-03-03 VITALS — BP 139/86 | HR 83 | Temp 98.2°F | Resp 18 | Wt 182.8 lb

## 2023-03-03 DIAGNOSIS — G43009 Migraine without aura, not intractable, without status migrainosus: Secondary | ICD-10-CM | POA: Diagnosis not present

## 2023-03-03 DIAGNOSIS — K529 Noninfective gastroenteritis and colitis, unspecified: Secondary | ICD-10-CM | POA: Diagnosis not present

## 2023-03-03 DIAGNOSIS — G8929 Other chronic pain: Secondary | ICD-10-CM

## 2023-03-03 DIAGNOSIS — M79644 Pain in right finger(s): Secondary | ICD-10-CM | POA: Diagnosis not present

## 2023-03-03 MED ORDER — SUMATRIPTAN SUCCINATE 25 MG PO TABS: 25.0000 mg | ORAL_TABLET | ORAL | 0 refills | Status: AC | PRN

## 2023-03-03 NOTE — Patient Instructions (Addendum)
Maintain adequate oral hydration daily and bland diet x 2days, then advance diet as tolerated Ok to use peptobismol OVER THE COUNTER as needed Use wrist brace with Spica splint.  Apply voltaren gel TID to right wrist and thumb Apply cold compress as needed  Bland Diet A bland diet may consist of soft foods or foods that are not high in fat or are not greasy, acidic, or spicy. Avoiding certain foods may cause less irritation to your mouth, throat, stomach, or gastrointestinal tract. Avoiding certain foods may make you feel better. Everyone's tolerances are different. A bland diet should be based on what you can tolerate and what may cause discomfort. What is my plan? Your health care provider or dietitian may recommend specific changes to your diet to treat your symptoms. These changes may include: Eating small meals frequently. Cooking food until it is soft enough to chew easily. Taking the time to chew your food thoroughly, so it is easy to swallow and digest. Avoiding foods that cause you discomfort. These may include spicy food, fried food, greasy foods, hard-to-chew foods, or citrus fruits and juices. Drinking slowly. What are tips for following this plan? Reading food labels To reduce fiber intake, look for food labels that say "whole," such as whole wheat or whole grain. Shopping Avoid food items that may have nuts or seeds. Avoid vegetables that may make you gassy or have a tough texture, such as broccoli, cauliflower, or corn. Cooking Cook foods thoroughly so they have a soft texture. Meal planning Make sure you include foods from all food groups to eat a balanced diet. Eat a variety of types of foods. Eat foods and drink beverages that do not cause you discomfort. These may include soups and broths with cooked meats, pasta, and vegetables. Lifestyle Sit up after meals, avoid tight clothing, and take time to eat and chew your food slowly. Ask your health care provider whether you  should take dietary supplements. General information Mildly season your foods. Some seasonings, such as cayenne pepper, vinegar, or hot sauce, may cause irritation. The foods, beverages, or seasonings to avoid should be based on individual tolerance. What foods should I eat? Fruits Canned or cooked fruit such as peaches, pears, or applesauce. Bananas. Vegetables Well-cooked vegetables. Canned or cooked vegetables such as carrots, green beans, beets, or spinach. Mashed or boiled potatoes. Grains  Hot cereals, such as cream of wheat and processed oatmeal. Rice. Bread, crackers, pasta, or tortillas made from refined white flour. Meats and other proteins  Eggs. Creamy peanut butter or other nut butters. Lean, well-cooked tender meats, such as beef, pork, chicken, or fish. Dairy Low-fat dairy products such as milk, cottage cheese, or yogurt. Beverages  Water. Herbal tea. Apple juice. Fats and oils Mild salad dressings. Canola or olive oil. Sweets and desserts Low-fat pudding, custard, or ice cream. Fruit gelatin. The items listed above may not be a complete list of foods and beverages you can eat. Contact a dietitian for more information. What foods should I avoid? Fruits Citrus fruits, such as oranges and grapefruit. Fruits with a stringy texture. Fruits that have lots of seeds, such as kiwi or strawberries. Dried fruits. Vegetables Raw, uncooked vegetables. Salads. Grains Whole grain breads, muffins, and cereals. Meats and other proteins Tough, fibrous meats. Highly seasoned meat such as corned beef, smoked meats, or fish. Processed high-fat meats such as brats, hot dogs, or sausage. Dairy Full-fat dairy foods such as ice cream and cheese. Beverages Caffeinated drinks. Alcohol. Seasonings and condiments  Strongly flavored seasonings or condiments. Hot sauce. Salsa. Other foods Spicy foods. Fried or greasy foods. Sour foods, such as pickled or fermented foods like sauerkraut.  Foods high in fiber. The items listed above may not be a complete list of foods and beverages you should avoid. Contact a dietitian for more information. Summary A bland diet should be based on individual tolerance. It may consist of foods that are soft textured and do not have a lot of fat, fiber, acid, or seasonings. A bland diet may be recommended because avoiding certain foods, beverages, or spices may make you feel better. This information is not intended to replace advice given to you by your health care provider. Make sure you discuss any questions you have with your health care provider. Document Revised: 03/17/2021 Document Reviewed: 03/17/2021 Elsevier Patient Education  2024 [employer].

## 2023-03-03 NOTE — Progress Notes (Signed)
Established Patient Visit  Patient: Abigail [name]   DOB: October 21, 1970   52 y.o. Female  MRN: [id number] Visit Date: 03/03/2023  Subjective:    Chief Complaint  Patient presents with   Acute Visit    PT is here for vomiting, diarrhea that started Monday. Patient symptoms are improving with mild fatigue. PT is also having right thumb pain hard to grip possible x ray request. RX refills are needed for (Imitrex 25 mg )   Hand Pain  The incident occurred more than 1 week ago. The injury mechanism was repetitive motion. Pain location: right thumb. The quality of the pain is described as aching. The pain does not radiate. The pain has been Fluctuating since the incident. Pertinent negatives include no chest pain, muscle weakness, numbness or tingling. The symptoms are aggravated by movement, lifting and palpation. She has tried nothing for the symptoms.  GI Problem The primary symptoms include abdominal pain, nausea, vomiting and diarrhea. Primary symptoms do not include fever, weight loss, fatigue, melena, hematemesis, jaundice, hematochezia, dysuria, myalgias, arthralgias or rash. The illness began 2 days ago. The onset was sudden. The problem has been resolved.  The illness does not include chills, anorexia, dysphagia, odynophagia, bloating, constipation, tenesmus, back pain or itching. Significant associated medical issues include GERD and irritable bowel syndrome. Associated medical issues do not include inflammatory bowel disease, gallstones, liver disease, alcohol abuse, PUD, gastric bypass, bowel resection, hemorrhoids or diverticulitis.  Headache and vomiting on Monday, now resolved Diarrhea 3 episode yesterday, now resolved Possible food contamination at birthday party and eating at restaurant.  Chronic pain of right thumb Right thumb pain with movement, with intermittent stiffness no swelling, no redness, no injury Right hand dominant.  Advised to use voltaren grl, compress  and spica splint.  Reviewed medical, surgical, and social history today  Medications: Outpatient Medications Prior to Visit  Medication Sig Note   amLODipine (NORVASC) 10 MG tablet TAKE 1 TABLET(10 MG) BY MOUTH DAILY    ciprofloxacin (CILOXAN) 0.3 % ophthalmic solution Place into the right eye.    diphenhydrAMINE (BENADRYL) 25 MG tablet Take 25 mg by mouth as needed.    estradiol (VIVELLE-DOT) 0.05 MG/24HR patch Place 1 patch (0.05 mg total) onto the skin 2 (two) times a week.    fluticasone (FLONASE) 50 MCG/ACT nasal spray SHAKE LIQUID AND USE 1 SPRAY IN EACH NOSTRIL DAILY    loratadine (CLARITIN) 10 MG tablet Take 10 mg by mouth daily.    losartan (COZAAR) 25 MG tablet TAKE 1 TABLET(25 MG) BY MOUTH DAILY    omeprazole (PRILOSEC) 20 MG capsule Take 1 capsule (20 mg total) by mouth daily.    pravastatin (PRAVACHOL) 20 MG tablet Take 1 tablet (20 mg total) by mouth daily.    propranolol (INDERAL) 10 MG tablet Take 1 tablet (10 mg total) by mouth 3 (three) times daily as needed.    triamcinolone ointment (KENALOG) 0.5 % Apply 1 application topically 2 (two) times daily. You may use for 2 weeks at a time as needed.    Vitamin D, Ergocalciferol, 2000 units CAPS Take 1 capsule by mouth daily after breakfast.    [DISCONTINUED] SUMAtriptan (IMITREX) 25 MG tablet Take 1-2 tablets (25-50 mg total) by mouth every 2 (two) hours as needed for migraine. May repeat in 2 hours if headache persists or recurs. No more than 100mg  in 24hrs 03/03/2023: Re fill needed    No facility-administered  medications prior to visit.   Reviewed past medical and social history.   ROS per HPI above      Objective:  BP 139/86 (BP Location: Left Arm, Patient Position: Sitting, Cuff Size: Large)   Pulse 83   Temp 98.2 F (36.8 C) (Oral)   Resp 18   Wt 182 lb 12.8 oz (82.9 kg)   LMP 01/18/2012 (Exact Date)   SpO2 96%   BMI 29.96 kg/m      Physical Exam Vitals and nursing note reviewed.  Constitutional:       General: She is not in acute distress.    Appearance: She is not ill-appearing.  Cardiovascular:     Rate and Rhythm: Normal rate.     Pulses: Normal pulses.  Pulmonary:     Effort: Pulmonary effort is normal.  Abdominal:     General: Bowel sounds are normal. There is no distension.     Palpations: Abdomen is soft.     Tenderness: There is no abdominal tenderness. There is no guarding.  Musculoskeletal:     Right wrist: Normal.     Left wrist: Normal.     Right hand: Tenderness present. No swelling, deformity or bony tenderness. Normal range of motion. Normal strength. Normal sensation.     Left hand: Normal.       Hands:     Comments: No atrophy noted  Neurological:     Mental Status: She is alert and oriented to person, place, and time.     No results found for any visits on 03/03/23.    Assessment & Plan:    Problem List Items Addressed This Visit     Chronic pain of right thumb    Right thumb pain with movement, with intermittent stiffness no swelling, no redness, no injury Right hand dominant.  Advised to use voltaren grl, compress and spica splint.      Migraine without aura and without status migrainosus, not intractable   Relevant Medications   SUMAtriptan (IMITREX) 25 MG tablet   Other Visit Diagnoses     Gastroenteritis    -  Primary     Maintain adequate oral hydration daily and bland diet x 2days, then advance diet as tolerated Ok to use peptobismol OVER THE COUNTER as needed Use wrist brace with Spica splint.  Apply voltaren gel TID to right wrist and thumb Apply cold compress as needed  Return in about 3 months (around 06/03/2023) for HTN, hyperlipidemia (fasting).     Alysia Penna, NP

## 2023-03-03 NOTE — Assessment & Plan Note (Signed)
Right thumb pain with movement, with intermittent stiffness no swelling, no redness, no injury Right hand dominant.  Advised to use voltaren grl, compress and spica splint.

## 2023-04-01 ENCOUNTER — Encounter: Payer: Self-pay | Admitting: Internal Medicine

## 2023-04-01 ENCOUNTER — Ambulatory Visit: Payer: 59 | Admitting: Internal Medicine

## 2023-04-01 ENCOUNTER — Other Ambulatory Visit: Payer: Self-pay | Admitting: Family Medicine

## 2023-04-01 ENCOUNTER — Ambulatory Visit: Payer: Self-pay

## 2023-04-01 VITALS — BP 130/80 | HR 71 | Temp 97.9°F | Ht 65.5 in | Wt 185.0 lb

## 2023-04-01 DIAGNOSIS — M26609 Unspecified temporomandibular joint disorder, unspecified side: Secondary | ICD-10-CM | POA: Diagnosis not present

## 2023-04-01 DIAGNOSIS — S86911A Strain of unspecified muscle(s) and tendon(s) at lower leg level, right leg, initial encounter: Secondary | ICD-10-CM

## 2023-04-01 MED ORDER — IBUPROFEN 800 MG PO TABS: 800.0000 mg | ORAL_TABLET | Freq: Three times a day (TID) | ORAL | 0 refills | Status: AC

## 2023-04-01 NOTE — Progress Notes (Signed)
[facility] [address] [place] [address] Dept: [phone] Dept Fax: [phone]  Acute Care Office Visit  Subjective:   Abigail [name] [birth year] 04/01/2023  Chief Complaint  Patient presents with   Jaw Pain    Moving to ear on left side  Started sunday    HPI: Discussed the use of AI scribe software for clinical note transcription with the patient, who gave verbal consent to proceed.  History of Present Illness   The patient presents with left-sided jaw discomfort that began on Sunday. The pain is continuous, but intensifies when they lay down. The discomfort initially localized to the jaw, but has since radiated towards the ear. They deny fever, chills, and sinus congestion. They have a history of tooth loss on the affected side and are aware of the need for dentures. They have not taken any pain medication as the discomfort is bearable during the day. The patient also reports difficulty opening and closing their jaw due to pain, and a sensation of swelling in the ear. They have not attempted any home remedies for the pain.          The following portions of the patient's history were reviewed and updated as appropriate: past medical history, past surgical history, family history, social history, allergies, medications, and problem list.   Patient Active Problem List   Diagnosis Date Noted   Chronic pain of right thumb 03/03/2023   Hyperlipidemia associated with type 2 diabetes mellitus (HCC) 11/06/2022   Panic disorder 06/19/2022   Urticaria due to food allergy 08/27/2020   Chronic idiopathic constipation 08/29/2019   Intermittent palpitations 11/29/2018   Migraine without aura and without status migrainosus, not intractable 10/19/2018   Chronic right-sided low back pain without sciatica 12/13/2017   Vitamin D insufficiency 07/27/2017   Seasonal allergic rhinitis 07/27/2017   Gallop rhythm 06/25/2017    Generalized anxiety disorder 04/10/2016   DM (diabetes mellitus) (HCC) 04/10/2016   GERD (gastroesophageal reflux disease) 04/10/2016   Environmental allergies 06/17/2012   S/p Abdominal Supracervical Hysterectomy, Left Salpingoohorectomy, Right Salpingectomy on 01/18/12 01/18/2012   HTN (hypertension) 10/29/2011   Past Medical History:  Diagnosis Date   Allergy    Anemia    iron supp   Anxiety    COVID 05/2020   Depression    Fibroids    GERD (gastroesophageal reflux disease)    omeprazole   Headache(784.0)    Hypertension 2012   Maxide   S/P LEEP 1992   Urticaria    Past Surgical History:  Procedure Laterality Date   ABDOMINAL HYSTERECTOMY  2014   Inicision and drainage of thrombosed hemorrhoid  04/06/2018   LEEP  1998   Abnormal pap   SUPRACERVICAL ABDOMINAL HYSTERECTOMY  01/18/2012   Procedure: HYSTERECTOMY SUPRACERVICAL ABDOMINAL with LSO;  Surgeon: Tereso Newcomer, MD;  Location: [facility];  Service: Gynecology;  Laterality: N/A;   TUBAL LIGATION  1997   WISDOM TOOTH EXTRACTION     Family History  Problem Relation Age of Onset   Hypertension Mother    Arthritis Mother    Pulmonary fibrosis Mother    Hypertension Father    Depression Father    Diabetes Father    Alcohol abuse Father    Colonic polyp Father    Diabetes Sister    Congestive Heart Failure Sister    Anxiety disorder Sister    Hypertension Sister    Arthritis Sister    Breast cancer Daughter 34  Cancer Daughter    Breast cancer Paternal Grandmother    Heart attack Paternal Grandmother    Breast cancer Cousin        1st cousin, paternal   Diabetes Brother    Allergic rhinitis Neg Hx    Angioedema Neg Hx    Asthma Neg Hx    Eczema Neg Hx    Immunodeficiency Neg Hx    Urticaria Neg Hx     Current Outpatient Medications:    amLODipine (NORVASC) 10 MG tablet, TAKE 1 TABLET(10 MG) BY MOUTH DAILY, Disp: 90 tablet, Rfl: 1   diphenhydrAMINE (BENADRYL) 25 MG tablet, Take 25 mg by mouth as  needed., Disp: , Rfl:    estradiol (VIVELLE-DOT) 0.05 MG/24HR patch, Place 1 patch (0.05 mg total) onto the skin 2 (two) times a week., Disp: 8 patch, Rfl: 2   fluticasone (FLONASE) 50 MCG/ACT nasal spray, SHAKE LIQUID AND USE 1 SPRAY IN EACH NOSTRIL DAILY, Disp: 16 g, Rfl: 5   ibuprofen (ADVIL) 800 MG tablet, Take 1 tablet (800 mg total) by mouth every 8 (eight) hours for 5 days., Disp: 15 tablet, Rfl: 0   losartan (COZAAR) 25 MG tablet, TAKE 1 TABLET(25 MG) BY MOUTH DAILY, Disp: 90 tablet, Rfl: 3   omeprazole (PRILOSEC) 20 MG capsule, Take 1 capsule (20 mg total) by mouth daily., Disp: 90 capsule, Rfl: 3   propranolol (INDERAL) 10 MG tablet, Take 1 tablet (10 mg total) by mouth 3 (three) times daily as needed., Disp: 270 tablet, Rfl: 1   SUMAtriptan (IMITREX) 25 MG tablet, Take 1-2 tablets (25-50 mg total) by mouth every 2 (two) hours as needed for migraine. May repeat in 2 hours if headache persists or recurs. No more than 100mg  in 24hrs, Disp: 10 tablet, Rfl: 0   triamcinolone ointment (KENALOG) 0.5 %, Apply 1 application topically 2 (two) times daily. You may use for 2 weeks at a time as needed., Disp: 30 g, Rfl: 0   Vitamin D, Ergocalciferol, 2000 units CAPS, Take 1 capsule by mouth daily after breakfast., Disp: 90 capsule, Rfl: 1   ciprofloxacin (CILOXAN) 0.3 % ophthalmic solution, Place into the right eye. (Patient not taking: Reported on 04/01/2023), Disp: , Rfl:    loratadine (CLARITIN) 10 MG tablet, Take 10 mg by mouth daily. (Patient not taking: Reported on 04/01/2023), Disp: , Rfl:    pravastatin (PRAVACHOL) 20 MG tablet, Take 1 tablet (20 mg total) by mouth daily. (Patient not taking: Reported on 04/01/2023), Disp: 90 tablet, Rfl: 3 Allergies  Allergen Reactions   Shellfish Allergy Itching and Swelling    Swollen tongue, itching   Tylox [Oxycodone-Acetaminophen] Itching   Tyloxapol      ROS: A complete ROS was performed with pertinent positives/negatives noted in the HPI. The  remainder of the ROS are negative.    Objective:   Today's Vitals   04/01/23 1037  BP: 130/80  Pulse: 71  Temp: 97.9 F (36.6 C)  TempSrc: Temporal  SpO2: 98%  Weight: 185 lb (83.9 kg)  Height: 5' 5.5" (1.664 m)    GENERAL: Well-appearing, in NAD. Well nourished.  SKIN: Pink, warm and dry. No rash, lesion, ulceration, or ecchymoses.  HEENT:    HEAD: Normocephalic, non-traumatic.  EYES: Conjunctive pink without exudate. PERRL, EOMI.  EARS: External ear w/o redness, swelling, masses, or lesions. EAC clear. TM's intact, translucent w/o bulging, appropriate landmarks visualized.  NOSE: Septum midline w/o deformity. Nares patent, mucosa pink and non-inflamed w/o drainage  THROAT: Uvula midline. Oropharynx clear.  Tonsils non-inflamed w/o exudate. Mucus membranes pink and moist. No abscess or tooth infection.Absent teeth to left upper and lower jaw.  NECK: Trachea midline. Full ROM w/o pain or tenderness. No lymphadenopathy.  RESPIRATORY: Chest wall symmetrical. Respirations even and non-labored.  MSK: Muscle tone and strength appropriate for age. No clicks or pops with jaw open/close.  PSYCH/MENTAL STATUS: Alert, oriented x 3. Cooperative, appropriate mood and affect.    No results found for any visits on 04/01/23.    Assessment & Plan:  Assessment and Plan    Temporomandibular Joint (TMJ) Syndrome Left-sided jaw pain radiating to the ear, exacerbated by opening and closing the mouth, and lying down. No signs of ear or gum infection. -Prescribe high-dose ibuprofen (800mg ) to be taken a couple of times a day for the next five days. -Advise patient to follow up with dentist next month. -If symptoms worsen or new symptoms develop, patient should return for re-evaluation.       Meds ordered this encounter  Medications   ibuprofen (ADVIL) 800 MG tablet    Sig: Take 1 tablet (800 mg total) by mouth every 8 (eight) hours for 5 days.    Dispense:  15 tablet    Refill:  0    Order  Specific Question:   Supervising Provider    Answer:   Garnette Gunner [id number]   No orders of the defined types were placed in this encounter.  Lab Orders  No laboratory test(s) ordered today   No images are attached to the encounter or orders placed in the encounter.  Return if symptoms worsen or fail to improve.   Salvatore Decent, FNP

## 2023-04-14 ENCOUNTER — Other Ambulatory Visit: Payer: Self-pay | Admitting: Nurse Practitioner

## 2023-04-14 DIAGNOSIS — I10 Essential (primary) hypertension: Secondary | ICD-10-CM

## 2023-04-21 ENCOUNTER — Encounter: Payer: Self-pay | Admitting: Nurse Practitioner

## 2023-04-22 ENCOUNTER — Other Ambulatory Visit: Payer: Self-pay

## 2023-04-22 DIAGNOSIS — J302 Other seasonal allergic rhinitis: Secondary | ICD-10-CM

## 2023-04-22 MED ORDER — FLUTICASONE PROPIONATE 50 MCG/ACT NA SUSP: NASAL | 5 refills | Status: DC

## 2023-04-22 NOTE — Telephone Encounter (Signed)
RX refilled; called patient to inform, no answer left brief VM.

## 2023-04-30 ENCOUNTER — Encounter: Payer: Self-pay | Admitting: Obstetrics and Gynecology

## 2023-05-03 ENCOUNTER — Other Ambulatory Visit: Payer: Self-pay

## 2023-05-03 MED ORDER — ESTRADIOL 0.05 MG/24HR TD PTTW: 1.0000 | MEDICATED_PATCH | TRANSDERMAL | 0 refills | Status: DC

## 2023-05-03 NOTE — Telephone Encounter (Signed)
Routed to provider for refill

## 2023-05-03 NOTE — Telephone Encounter (Signed)
Med refill request: Abigail [name] Last AEX: 10/13/21 Next AEX: 05/26/23 Last MMG (if hormonal med) 03/23/22 Refill authorized: Please Advise, #8, 0 RF

## 2023-05-10 ENCOUNTER — Other Ambulatory Visit: Payer: Self-pay | Admitting: Nurse Practitioner

## 2023-05-10 DIAGNOSIS — Z1231 Encounter for screening mammogram for malignant neoplasm of breast: Secondary | ICD-10-CM

## 2023-05-18 NOTE — Progress Notes (Signed)
 53 y.o. [name] Married [place] American female here for annual exam.    On ERT for vasomotor symptoms.  Still having hot flashes, which are not controlled but are improved. Has some thigh pain when she is due to change her patch. No vaginal bleeding.   Desires STD screening.  Having some marital issues.   Osteoarthritis in her right knee.   PCP: Abigail Organ, NP   Patient's last menstrual period was 01/18/2012 (exact date).           Sexually active: Yes.    The current method of family planning is status post hysterectomy.    Menopausal hormone therapy:  vivelle -dot Exercising: No.   Smoker:  no  OB History  Gravida Para Term Preterm AB Living  3 2 2  0 1 2  SAB IAB Ectopic Multiple Live Births  1 0 0 0 2    # Outcome Date GA Lbr Len/2nd Weight Sex Type Anes PTL Lv  3 Term 06/1995 [redacted]w[redacted]d  10 lb 8 oz (4.763 kg) M Vag-Spont   LIV  2 Term 04/1988 [redacted]w[redacted]d  8 lb 13 oz (3.997 kg) F Vag-Spont   LIV  1 SAB 1988             HEALTH MAINTENANCE: Last 2 paps:  07/26/20 neg: HR HPV neg History of abnormal Pap or positive HPV:  yes, 1992 hx of LEEP Mammogram:   03/23/22 Breast Density Cat C, BI-RADS CAT 2 benign.  Has appointment for 06/01/2023.  Colonoscopy:  will schedule Bone Density:  n/a  Result  n/a   Immunization History  Administered Date(s) Administered   Hepatitis B 02/23/2018   Hepb-cpg 02/23/2018   Influenza,inj,Quad PF,6+ Mos 03/20/2020, 02/16/2022   Influenza-Unspecified 03/11/2012, 04/24/2016   PFIZER(Purple Top)SARS-COV-2 Vaccination 08/30/2018, 08/09/2019   PPD Test 02/23/2018   Tdap 05/11/2005, 04/10/2014      reports that she has never smoked. She has never used smokeless tobacco. She reports current alcohol use of about 4.0 standard drinks of alcohol per week. She reports that she does not use drugs.  Past Medical History:  Diagnosis Date   Allergy     Anemia    iron supp   Anxiety    COVID 05/2020   Depression    Fibroids    GERD  (gastroesophageal reflux disease)    omeprazole    Headache(784.0)    Hypertension 2012   Maxide   S/P LEEP 1992   Urticaria     Past Surgical History:  Procedure Laterality Date   ABDOMINAL HYSTERECTOMY  2014   Inicision and drainage of thrombosed hemorrhoid  04/06/2018   LEEP  1998   Abnormal pap   SUPRACERVICAL ABDOMINAL HYSTERECTOMY  01/18/2012   Procedure: HYSTERECTOMY SUPRACERVICAL ABDOMINAL with LSO;  Surgeon: Julianne Octave, MD;  Location: [facility];  Service: Gynecology;  Laterality: N/A;   TUBAL LIGATION  1997   WISDOM TOOTH EXTRACTION      Current Outpatient Medications  Medication Sig Dispense Refill   amLODipine  (NORVASC ) 10 MG tablet TAKE 1 TABLET(10 MG) BY MOUTH DAILY 90 tablet 1   diphenhydrAMINE  (BENADRYL ) 25 MG tablet Take 25 mg by mouth as needed.     [START ON 05/27/2023] estradiol  (VIVELLE -DOT) 0.075 MG/24HR Place 1 patch onto the skin 2 (two) times a week. 24 patch 3   fluticasone  (FLONASE ) 50 MCG/ACT nasal spray SHAKE LIQUID AND USE 1 SPRAY IN EACH NOSTRIL DAILY 16 g 5   loratadine (CLARITIN) 10 MG tablet Take 10 mg by  mouth daily.     losartan  (COZAAR ) 25 MG tablet TAKE 1 TABLET(25 MG) BY MOUTH DAILY 90 tablet 3   meloxicam  (MOBIC ) 15 MG tablet Take 15 mg by mouth daily as needed.     omeprazole  (PRILOSEC ) 20 MG capsule Take 1 capsule (20 mg total) by mouth daily. 90 capsule 3   predniSONE  (STERAPRED UNI-PAK 21 TAB) 5 MG (21) TBPK tablet Take by mouth as directed.     propranolol  (INDERAL ) 10 MG tablet Take 1 tablet (10 mg total) by mouth 3 (three) times daily as needed. 270 tablet 1   SUMAtriptan  (IMITREX ) 25 MG tablet Take 1-2 tablets (25-50 mg total) by mouth every 2 (two) hours as needed for migraine. May repeat in 2 hours if headache persists or recurs. No more than 100mg  in 24hrs 10 tablet 0   triamcinolone  ointment (KENALOG ) 0.5 % Apply 1 application topically 2 (two) times daily. You may use for 2 weeks at a time as needed. 30 g 0   Vitamin D ,  Ergocalciferol , 2000 units CAPS Take 1 capsule by mouth daily after breakfast. 90 capsule 1   No current facility-administered medications for this visit.    ALLERGIES: Shellfish allergy , Tylox [oxycodone -acetaminophen ], and Tyloxapol  Family History  Problem Relation Age of Onset   Hypertension Mother    Arthritis Mother    Pulmonary fibrosis Mother    Hypertension Father    Depression Father    Diabetes Father    Alcohol abuse Father    Colonic polyp Father    Diabetes Sister    Congestive Heart Failure Sister    Anxiety disorder Sister    Hypertension Sister    Arthritis Sister    Breast cancer Daughter 29   Cancer Daughter    Breast cancer Paternal Grandmother    Heart attack Paternal Grandmother    Breast cancer Cousin        1st cousin, paternal   Diabetes Brother    Allergic rhinitis Neg Hx    Angioedema Neg Hx    Asthma Neg Hx    Eczema Neg Hx    Immunodeficiency Neg Hx    Urticaria Neg Hx     Review of Systems  All other systems reviewed and are negative.   PHYSICAL EXAM:  BP 130/74 (BP Location: Left Arm, Patient Position: Sitting, Cuff Size: Small)   Pulse 71   Ht 5' 4.5" (1.638 m)   Wt 183 lb (83 kg)   LMP 01/18/2012 (Exact Date)   SpO2 99%   BMI 30.93 kg/m     General appearance: alert, cooperative and appears stated age Head: normocephalic, without obvious abnormality, atraumatic Neck: no adenopathy, supple, symmetrical, trachea midline and thyroid  normal to inspection and palpation Lungs: clear to auscultation bilaterally Breasts: normal appearance, no masses or tenderness, No nipple retraction or dimpling, No nipple discharge or bleeding, No axillary adenopathy Heart: regular rate and rhythm Abdomen: soft, non-tender; no masses, no organomegaly Extremities: extremities normal, atraumatic, no cyanosis or edema Skin: skin color, texture, turgor normal. No rashes or lesions Lymph nodes: cervical, supraclavicular, and axillary nodes  normal. Neurologic: grossly normal  Pelvic: External genitalia:  no lesions              No abnormal inguinal nodes palpated.              Urethra:  normal appearing urethra with no masses, tenderness or lesions              Bartholins and Skenes:  normal                 Vagina: normal appearing vagina with normal color and discharge, no lesions              Cervix: no lesions              Pap taken: No. Bimanual Exam:  Uterus:  absent              Adnexa: no mass, fullness, tenderness              Rectal exam: Yes.  .  Confirms.              Anus:  normal sphincter tone, no lesions  Chaperone was present for exam:  Emmaline Haring, CMA  ASSESSMENT: Well woman visit with gynecologic exam Status post supracervical hysterectomy, LSO, right salpingectomy for fibroids.  Right ovary remains. Hx LEEP 1998. Daughter with breast cancer, age 70 yo.  Menopausal symptoms on ERT Vivelle  Dot 0.05 mg twice weekly. STD screening. Colon cancer screening.  Routine labs with PCP.  PLAN: Mammogram screening discussed. Self breast awareness reviewed. Pap and HRV collected:  No.  Due in 2027. Guidelines for Calcium , Vitamin D , regular exercise program including cardiovascular and weight bearing exercise. Medication refills:  Will increased Vivelle  Dot to 0.075 gm twice weekly.  STD screening.  Referral for colonoscopy.  Follow up:  yearly and prn.

## 2023-05-26 ENCOUNTER — Other Ambulatory Visit (HOSPITAL_COMMUNITY)
Admission: RE | Admit: 2023-05-26 | Discharge: 2023-05-26 | Disposition: A | Discharge status: Home / Self Care | Payer: 59 | Source: Ambulatory Visit | Attending: Obstetrics and Gynecology | Admitting: Obstetrics and Gynecology

## 2023-05-26 ENCOUNTER — Encounter: Payer: Self-pay | Admitting: Obstetrics and Gynecology

## 2023-05-26 ENCOUNTER — Ambulatory Visit (INDEPENDENT_AMBULATORY_CARE_PROVIDER_SITE_OTHER): Payer: 59 | Admitting: Obstetrics and Gynecology

## 2023-05-26 VITALS — BP 130/74 | HR 71 | Ht 64.5 in | Wt 183.0 lb

## 2023-05-26 DIAGNOSIS — Z113 Encounter for screening for infections with a predominantly sexual mode of transmission: Secondary | ICD-10-CM

## 2023-05-26 DIAGNOSIS — Z01419 Encounter for gynecological examination (general) (routine) without abnormal findings: Secondary | ICD-10-CM | POA: Diagnosis not present

## 2023-05-26 DIAGNOSIS — Z114 Encounter for screening for human immunodeficiency virus [HIV]: Secondary | ICD-10-CM

## 2023-05-26 DIAGNOSIS — Z1159 Encounter for screening for other viral diseases: Secondary | ICD-10-CM

## 2023-05-26 DIAGNOSIS — Z1211 Encounter for screening for malignant neoplasm of colon: Secondary | ICD-10-CM

## 2023-05-26 MED ORDER — ESTRADIOL 0.075 MG/24HR TD PTTW: 1.0000 | MEDICATED_PATCH | TRANSDERMAL | 3 refills | Status: AC

## 2023-05-26 NOTE — Patient Instructions (Signed)

## 2023-05-27 LAB — HIV ANTIBODY (ROUTINE TESTING W REFLEX): HIV 1&2 Ab, 4th Generation: NONREACTIVE

## 2023-05-27 LAB — CERVICOVAGINAL ANCILLARY ONLY
Chlamydia: NEGATIVE
Comment: NEGATIVE
Comment: NEGATIVE
Comment: NORMAL
Neisseria Gonorrhea: NEGATIVE
Trichomonas: NEGATIVE

## 2023-05-27 LAB — HEPATITIS C ANTIBODY: Hepatitis C Ab: NONREACTIVE

## 2023-05-27 LAB — RPR: RPR Ser Ql: NONREACTIVE

## 2023-05-29 ENCOUNTER — Encounter: Payer: Self-pay | Admitting: Obstetrics and Gynecology

## 2023-06-01 ENCOUNTER — Ambulatory Visit
Admission: RE | Admit: 2023-06-01 | Discharge: 2023-06-01 | Disposition: A | Discharge status: Home / Self Care | Payer: 59 | Source: Ambulatory Visit | Attending: Nurse Practitioner

## 2023-06-01 DIAGNOSIS — Z1231 Encounter for screening mammogram for malignant neoplasm of breast: Secondary | ICD-10-CM

## 2023-06-09 ENCOUNTER — Ambulatory Visit: Payer: 59 | Admitting: Nurse Practitioner

## 2023-06-10 ENCOUNTER — Ambulatory Visit: Payer: 59 | Admitting: Nurse Practitioner

## 2023-06-10 ENCOUNTER — Encounter: Payer: Self-pay | Admitting: Nurse Practitioner

## 2023-06-10 VITALS — BP 121/77 | HR 75 | Temp 98.5°F | Resp 18 | Wt 185.4 lb

## 2023-06-10 DIAGNOSIS — J069 Acute upper respiratory infection, unspecified: Secondary | ICD-10-CM

## 2023-06-10 LAB — POC COVID19 BINAXNOW: SARS Coronavirus 2 Ag: NEGATIVE

## 2023-06-10 LAB — POCT INFLUENZA A/B
Influenza A, POC: NEGATIVE
Influenza B, POC: NEGATIVE

## 2023-06-10 NOTE — Patient Instructions (Signed)
URI Instructions: Use systane or refresh eye drops as needed Avoid decongestants if you have high blood pressure.  Ok to use Coricidin HBP for chest and sinus congestion Use mucinex DM or Robitussin  or delsym for cough without sinus congestion  You can use plain "Tylenol" or "Advil" for fever, chills and achyness. Use cool mist humidifier at bedtime to help with nasal congestion and cough.  Cold/cough medications may have tylenol or ibuprofen or guaifenesin or dextromethophan in them, so be careful not to take beyond the recommended dose for each of these medications.   "Common cold" symptoms are usually triggered by a virus.  The antibiotics are usually not necessary. On average, a" viral cold" illness may take 7-10 days to resolve. Please, make an appointment if you are not better or if you're worse.

## 2023-06-10 NOTE — Progress Notes (Signed)
Acute Office Visit  Subjective:    Patient ID: Abigail [name], female    DOB: [birth year], 53 y.o.   MRN: [id number]  Chief Complaint  Patient presents with   ACUTE VISIT     PT C/O of left eye redness for 2 days; with congestion for 1 day headache. PT took sudafed for symptoms    URI  This is a new problem. The current episode started in the past 7 days. The problem has been unchanged. Associated symptoms include congestion, headaches, rhinorrhea and sinus pain. Pertinent negatives include no abdominal pain, chest pain, coughing, diarrhea, dysuria, ear pain, joint pain, joint swelling, nausea, neck pain, plugged ear sensation, rash, sneezing, sore throat, swollen glands, vomiting or wheezing. She has tried decongestant and acetaminophen for the symptoms. The treatment provided no relief.  Prednisone dose pack completed 2weeks ago. Current use of flonase daily.  Outpatient Medications Prior to Visit  Medication Sig   amLODipine (NORVASC) 10 MG tablet TAKE 1 TABLET(10 MG) BY MOUTH DAILY   diphenhydrAMINE (BENADRYL) 25 MG tablet Take 25 mg by mouth as needed.   estradiol (VIVELLE-DOT) 0.075 MG/24HR Place 1 patch onto the skin 2 (two) times a week.   fluticasone (FLONASE) 50 MCG/ACT nasal spray SHAKE LIQUID AND USE 1 SPRAY IN EACH NOSTRIL DAILY   loratadine (CLARITIN) 10 MG tablet Take 10 mg by mouth daily.   losartan (COZAAR) 25 MG tablet TAKE 1 TABLET(25 MG) BY MOUTH DAILY   meloxicam (MOBIC) 15 MG tablet Take 15 mg by mouth daily as needed.   omeprazole (PRILOSEC) 20 MG capsule Take 1 capsule (20 mg total) by mouth daily.   propranolol (INDERAL) 10 MG tablet Take 1 tablet (10 mg total) by mouth 3 (three) times daily as needed.   SUMAtriptan (IMITREX) 25 MG tablet Take 1-2 tablets (25-50 mg total) by mouth every 2 (two) hours as needed for migraine. May repeat in 2 hours if headache persists or recurs. No more than 100mg  in 24hrs   triamcinolone ointment (KENALOG) 0.5 % Apply 1 application  topically 2 (two) times daily. You may use for 2 weeks at a time as needed.   Vitamin D, Ergocalciferol, 2000 units CAPS Take 1 capsule by mouth daily after breakfast.   [DISCONTINUED] predniSONE (STERAPRED UNI-PAK 21 TAB) 5 MG (21) TBPK tablet Take by mouth as directed. (Patient not taking: Reported on 06/10/2023)   No facility-administered medications prior to visit.   Reviewed past medical and social history.  Review of Systems  HENT:  Positive for congestion, rhinorrhea and sinus pain. Negative for ear pain, sneezing and sore throat.   Respiratory:  Negative for cough and wheezing.   Cardiovascular:  Negative for chest pain.  Gastrointestinal:  Negative for abdominal pain, diarrhea, nausea and vomiting.  Genitourinary:  Negative for dysuria.  Musculoskeletal:  Negative for joint pain and neck pain.  Skin:  Negative for rash.  Neurological:  Positive for headaches.      Objective:    Physical Exam Vitals and nursing note reviewed.  HENT:     Nose:     Right Nostril: No occlusion.     Left Nostril: No occlusion.     Right Turbinates: Swollen.     Left Turbinates: Swollen.     Right Sinus: Maxillary sinus tenderness and frontal sinus tenderness present.     Left Sinus: Maxillary sinus tenderness and frontal sinus tenderness present.  Eyes:     General: Lids are normal.     Extraocular Movements: Extraocular  movements intact.     Conjunctiva/sclera: Conjunctivae normal.     Right eye: Chemosis present.     Left eye: Chemosis present.  Cardiovascular:     Rate and Rhythm: Normal rate and regular rhythm.     Pulses: Normal pulses.     Heart sounds: Normal heart sounds.  Pulmonary:     Effort: Pulmonary effort is normal.     Breath sounds: Normal breath sounds.  Neurological:     Mental Status: She is alert and oriented to person, place, and time.    BP 121/77 (BP Location: Left Arm, Patient Position: Sitting, Cuff Size: Large)   Pulse 75   Temp 98.5 F (36.9 C) (Oral)    Resp 18   Wt 185 lb 6.4 oz (84.1 kg)   LMP 01/18/2012 (Exact Date)   SpO2 100%   BMI 31.33 kg/m    Results for orders placed or performed in visit on 06/10/23  POCT Influenza A/B  Result Value Ref Range   Influenza A, POC Negative Negative   Influenza B, POC Negative Negative  POC COVID-19  Result Value Ref Range   SARS Coronavirus 2 Ag Negative Negative      Assessment & Plan:   Problem List Items Addressed This Visit   None Visit Diagnoses       Viral upper respiratory tract infection    -  Primary   Relevant Orders   POCT Influenza A/B (Completed)   POC COVID-19 (Completed)     URI instructions: Use systane or refresh eye drops as needed Avoid decongestants if you have high blood pressure.  Ok to use Coricidin HBP for chest and sinus congestion Use mucinex DM or Robitussin  or delsym for cough without sinus congestion  You can use plain "Tylenol" or "Advil" for fever, chills and achyness. Use cool mist humidifier at bedtime to help with nasal congestion and cough.  No orders of the defined types were placed in this encounter.  Return if symptoms worsen or fail to improve.  Alysia Penna, NP

## 2023-06-15 ENCOUNTER — Encounter: Payer: Self-pay | Admitting: Gastroenterology

## 2023-06-16 ENCOUNTER — Telehealth: Payer: Self-pay

## 2023-06-16 NOTE — Telephone Encounter (Signed)
 Copied from CRM [id number]. Topic: Clinical - Medical Advice >> Jun 16, 2023  2:27 PM Thersia C wrote: Reason for CRM: Patient called stating her job has reported cases of covid tested negative earlier in the week tested this morning and was positive, wanted to know if she can relay on that test or does she need to come in to actually be tested. Is requesting a callback on this matter

## 2023-06-16 NOTE — Telephone Encounter (Signed)
 I called and spoke with patient and advised her of [name]' recommendations. She agreed and will follow up in office if she starts to feel worse.

## 2023-06-17 ENCOUNTER — Encounter: Payer: Self-pay | Admitting: Nurse Practitioner

## 2023-06-17 DIAGNOSIS — U071 COVID-19: Secondary | ICD-10-CM

## 2023-06-17 MED ORDER — BENZONATATE 200 MG PO CAPS: 200.0000 mg | ORAL_CAPSULE | Freq: Three times a day (TID) | ORAL | 0 refills | Status: DC | PRN

## 2023-06-18 ENCOUNTER — Ambulatory Visit (AMBULATORY_SURGERY_CENTER): Payer: 59

## 2023-06-18 VITALS — Ht 66.0 in | Wt 180.0 lb

## 2023-06-18 DIAGNOSIS — Z1211 Encounter for screening for malignant neoplasm of colon: Secondary | ICD-10-CM

## 2023-06-18 MED ORDER — SUFLAVE 178.7 G PO SOLR: 1.0000 | Freq: Once | ORAL | 0 refills | Status: AC

## 2023-06-18 NOTE — Progress Notes (Signed)
 No egg or soy allergy  known to patient  No issues known to pt with past sedation with any surgeries or procedures Patient denies ever being told they had issues or difficulty with intubation  No FH of Malignant Hyperthermia Pt is not on diet pills Pt is not on  home 02  Pt is not on blood thinners  Pt has issues with constipation takes senna tablets No A fib or A flutter Have any cardiac testing pending- no Pt can ambulate  Pt denies use of chewing tobacco Discussed diabetic I weight loss medication holds Discussed NSAID holds Checked BMIindependently Pt instructed to use [employer] or GoodRx for a price reduction on prep  Patient's chart reviewed by Norleen Schillings CNRA prior to previsit and patient appropriate for the [facility].  Pre visit completed and red dot placed by patient's name on their procedure day (on provider's schedule).

## 2023-06-25 ENCOUNTER — Ambulatory Visit: Payer: 59 | Admitting: Nurse Practitioner

## 2023-07-07 ENCOUNTER — Ambulatory Visit: Payer: 59 | Admitting: Nurse Practitioner

## 2023-07-27 ENCOUNTER — Encounter: Payer: Self-pay | Admitting: Nurse Practitioner

## 2023-07-27 ENCOUNTER — Ambulatory Visit: Payer: 59 | Admitting: Nurse Practitioner

## 2023-07-27 VITALS — BP 132/84 | HR 78 | Temp 97.9°F | Ht 66.0 in | Wt 183.4 lb

## 2023-07-27 DIAGNOSIS — I10 Essential (primary) hypertension: Secondary | ICD-10-CM | POA: Diagnosis not present

## 2023-07-27 DIAGNOSIS — Z91013 Allergy to seafood: Secondary | ICD-10-CM | POA: Diagnosis not present

## 2023-07-27 DIAGNOSIS — E1169 Type 2 diabetes mellitus with other specified complication: Secondary | ICD-10-CM | POA: Diagnosis not present

## 2023-07-27 DIAGNOSIS — E785 Hyperlipidemia, unspecified: Secondary | ICD-10-CM

## 2023-07-27 DIAGNOSIS — E1165 Type 2 diabetes mellitus with hyperglycemia: Secondary | ICD-10-CM | POA: Diagnosis not present

## 2023-07-27 DIAGNOSIS — Z23 Encounter for immunization: Secondary | ICD-10-CM | POA: Diagnosis not present

## 2023-07-27 LAB — COMPREHENSIVE METABOLIC PANEL
ALT: 16 U/L (ref 0–35)
AST: 18 U/L (ref 0–37)
Albumin: 4.4 g/dL (ref 3.5–5.2)
Alkaline Phosphatase: 59 U/L (ref 39–117)
BUN: 14 mg/dL (ref 6–23)
CO2: 29 meq/L (ref 19–32)
Calcium: 9.5 mg/dL (ref 8.4–10.5)
Chloride: 103 meq/L (ref 96–112)
Creatinine, Ser: 0.78 mg/dL (ref 0.40–1.20)
GFR: 86.48 mL/min (ref >60.00)
Glucose, Bld: 83 mg/dL (ref 70–99)
Potassium: 4.1 meq/L (ref 3.5–5.1)
Sodium: 139 meq/L (ref 135–145)
Total Bilirubin: 0.4 mg/dL (ref 0.2–1.2)
Total Protein: 7.4 g/dL (ref 6.0–8.3)

## 2023-07-27 LAB — LIPID PANEL
Cholesterol: 219 mg/dL — ABNORMAL HIGH (ref 0–200)
HDL: 60 mg/dL (ref >39.00)
LDL Cholesterol: 143 mg/dL — ABNORMAL HIGH (ref 0–99)
NonHDL: 158.93
Total CHOL/HDL Ratio: 4
Triglycerides: 82 mg/dL (ref 0.0–149.0)
VLDL: 16.4 mg/dL (ref 0.0–40.0)

## 2023-07-27 LAB — MICROALBUMIN / CREATININE URINE RATIO
Creatinine,U: 175.3 mg/dL
Microalb Creat Ratio: 9.2 mg/g (ref 0.0–30.0)
Microalb, Ur: 1.6 mg/dL (ref 0.0–1.9)

## 2023-07-27 LAB — HEMOGLOBIN A1C: Hgb A1c MFr Bld: 6.1 % (ref 4.6–6.5)

## 2023-07-27 MED ORDER — EPINEPHRINE 0.3 MG/0.3ML IJ SOAJ: 0.3000 mg | INTRAMUSCULAR | 1 refills | Status: AC | PRN

## 2023-07-27 NOTE — Assessment & Plan Note (Signed)
 BP at goal with amlodipine, losartan, and propanolol BP Readings from Last 3 Encounters:  07/27/23 132/84  06/10/23 121/77  05/26/23 130/74    Maintain med doses Repeat CMP F/up in 6months

## 2023-07-27 NOTE — Assessment & Plan Note (Signed)
 Repeat hgbA1c, UACr and lipid panel today BP at goal No statin Use of ARB Eye exam completed No neuropathy or retinopathy. No medication at this time

## 2023-07-27 NOTE — Patient Instructions (Addendum)
 Schedule appointment for colonoscopy: [phone] Go to lab Maintain Heart healthy diet and daily exercise. Maintain current medications.  How to Increase Your Level of Physical Activity Getting regular physical activity is important for your overall health and well-being. Most people do not get enough exercise. There are easy ways to increase your level of physical activity, even if you have not been very active in the past or if you are just starting out. What are the benefits of physical activity? Physical activity has many short-term and long-term benefits. Being active on a regular basis can improve your physical and mental health as well as provide other benefits. Physical health benefits Helping you lose weight or maintain a healthy weight. Strengthening your muscles and bones. Reducing your risk of certain long-term (chronic) diseases, including heart disease, cancer, and diabetes. Being able to move around more easily and for longer periods of time without getting tired (increased endurance or stamina). Improving your ability to fight off illness (enhanced immunity). Being able to sleep better. Helping you stay healthy as you get older, including: Helping you stay mobile, or capable of walking and moving around. Preventing accidents, such as falls. Increasing life expectancy. Mental health benefits Boosting your mood and improving your self-esteem. Lowering your chance of having mental health problems, such as depression or anxiety. Helping you feel good about your body. Other benefits Finding new sources of fun and enjoyment. Meeting new people who share a common interest. Before you begin If you have a chronic illness or have not been active for a while, check with your health care provider about how to get started. Ask your health care provider what activities are safe for you. Start out slowly. Walking or doing some simple chair exercises is a good place to start,  especially if you have not been active before or for a long time. Set goals that you can work toward. Ask your health care provider how much exercise is best for you. In general, most adults should: Do moderate-intensity exercise for at least 150 minutes each week (30 minutes on most days of the week) or vigorous exercise for at least 75 minutes each week, or a combination of these. Moderate-intensity exercise can include walking at a quick pace, biking, yoga, water aerobics, or gardening. Vigorous exercise involves activities that take more effort, such as jogging or running, playing sports, swimming laps, or jumping rope. Do strength exercises on at least 2 days each week. This can include weight lifting, body weight exercises, and resistance-band exercises. How to be more physically active Make a plan  Try to find activities that you enjoy. You are more likely to commit to an exercise routine if it does not feel like a chore. If you have bone or joint problems, choose low-impact exercises, like walking or swimming. Use these tips for being successful with an exercise plan: Find a workout partner for accountability. Join a group or class, such as an aerobics class, cycling class, or sports team. Make family time active. Go for a walk, bike, or swim. Include a variety of exercises each week. Consider using a fitness tracker, such as a mobile phone app or a device worn like a watch, that will count the number of steps you take each day. Many people strive to reach 10,000 steps a day. Find ways to be active in your daily routines Besides your formal exercise plans, you can find ways to do physical activity during your daily routines, such as: Walking or biking  to work or to the store. Taking the stairs instead of the elevator. Parking farther away from the door at work or at the store. Planning walking meetings. Walking around while you are on the phone. Where to find more information Centers  for Disease Control and Prevention: [employer]'s Council on Fitness, Sports & Nutrition: www.fitness.gov ChooseMyPlate: http://www.harvey.com/ Contact a health care provider if: You have headaches, muscle aches, or joint pain that is concerning. You feel dizzy or light-headed while exercising. You faint. You feel your heart skipping, racing, or fluttering. You have chest pain while exercising. Summary Exercise benefits your mind and body at any age, even if you are just starting out. If you have a chronic illness or have not been active for a while, check with your health care provider before increasing your physical activity. Choose activities that are safe and enjoyable for you. Ask your health care provider what activities are safe for you. Start slowly. Tell your health care provider if you have problems as you start to increase your activity level. This information is not intended to replace advice given to you by your health care provider. Make sure you discuss any questions you have with your health care provider. Document Revised: 08/23/2020 Document Reviewed: 08/23/2020 Elsevier Patient Education  2024 [employer].

## 2023-07-27 NOTE — Assessment & Plan Note (Addendum)
 Repeat lipid panel: Abnormal lipid panel: start atorvastatin to improve LDL and decrease your risk of developing heart disease. New PRESCRIPTION  sent. 45yrs ASCVD risk at 11% Also need to maintain a mediterranean diet Schedule lab appointment to repeat lipid panel in 3months (fasting) Advised about importance of heart healthy diet and daily exercise

## 2023-07-27 NOTE — Progress Notes (Signed)
 Established Patient Visit  Patient: Abigail [name]   DOB: 03-Mar-1971   53 y.o. Female  MRN: [id number] Visit Date: 07/27/2023  Subjective:    Chief Complaint  Patient presents with   Follow-up   HPI HTN (hypertension) BP at goal with amlodipine, losartan, and propanolol BP Readings from Last 3 Encounters:  07/27/23 132/84  06/10/23 121/77  05/26/23 130/74    Maintain med doses Repeat CMP F/up in 6months  DM (diabetes mellitus) (HCC) Repeat hgbA1c, UACr and lipid panel today BP at goal No statin Use of ARB Eye exam completed No neuropathy or retinopathy. No medication at this time   Hyperlipidemia associated with type 2 diabetes mellitus (HCC) Repeat lipid panel Advised about importance of heart healthy diet and daily exercise  Urticaria due to food allergy Advised to maintain daily clartitn or zyrtec 1tab daily, keep benadryl 25mg  and epipen for prn use.  Wt Readings from Last 3 Encounters:  07/27/23 183 lb 6.4 oz (83.2 kg)  06/18/23 180 lb (81.6 kg)  06/10/23 185 lb 6.4 oz (84.1 kg)    Wt Readings from Last 3 Encounters:  07/27/23 183 lb 6.4 oz (83.2 kg)  06/18/23 180 lb (81.6 kg)  06/10/23 185 lb 6.4 oz (84.1 kg)    Reviewed medical, surgical, and social history today  Medications: Outpatient Medications Prior to Visit  Medication Sig   amLODipine (NORVASC) 10 MG tablet TAKE 1 TABLET(10 MG) BY MOUTH DAILY   diphenhydrAMINE (BENADRYL) 25 MG tablet Take 25 mg by mouth as needed.   estradiol (VIVELLE-DOT) 0.075 MG/24HR Place 1 patch onto the skin 2 (two) times a week.   fluticasone (FLONASE) 50 MCG/ACT nasal spray SHAKE LIQUID AND USE 1 SPRAY IN EACH NOSTRIL DAILY   loratadine (CLARITIN) 10 MG tablet Take 10 mg by mouth daily.   losartan (COZAAR) 25 MG tablet TAKE 1 TABLET(25 MG) BY MOUTH DAILY   meloxicam (MOBIC) 15 MG tablet Take 15 mg by mouth daily as needed.   omeprazole (PRILOSEC) 20 MG capsule Take 1 capsule (20 mg total) by mouth  daily.   propranolol (INDERAL) 10 MG tablet Take 1 tablet (10 mg total) by mouth 3 (three) times daily as needed.   SUMAtriptan (IMITREX) 25 MG tablet Take 1-2 tablets (25-50 mg total) by mouth every 2 (two) hours as needed for migraine. May repeat in 2 hours if headache persists or recurs. No more than 100mg  in 24hrs   triamcinolone ointment (KENALOG) 0.5 % Apply 1 application topically 2 (two) times daily. You may use for 2 weeks at a time as needed.   Vitamin D, Ergocalciferol, 2000 units CAPS Take 1 capsule by mouth daily after breakfast.   [DISCONTINUED] benzonatate (TESSALON) 200 MG capsule Take 1 capsule (200 mg total) by mouth 3 (three) times daily as needed. (Patient not taking: Reported on 07/27/2023)   No facility-administered medications prior to visit.   Reviewed past medical and social history.   ROS per HPI above      Objective:  BP 132/84 (BP Location: Left Arm, Patient Position: Sitting, Cuff Size: Normal)   Pulse 78   Temp 97.9 F (36.6 C) (Temporal)   Ht 5\' 6"  (1.676 m)   Wt 183 lb 6.4 oz (83.2 kg)   LMP 01/18/2012 (Exact Date)   SpO2 99%   BMI 29.60 kg/m      Physical Exam Vitals and nursing note reviewed.  Cardiovascular:  Rate and Rhythm: Normal rate and regular rhythm.     Pulses: Normal pulses.     Heart sounds: Normal heart sounds.  Pulmonary:     Effort: Pulmonary effort is normal.     Breath sounds: Normal breath sounds.  Musculoskeletal:     Right lower leg: No edema.     Left lower leg: No edema.  Neurological:     Mental Status: She is alert and oriented to person, place, and time.     No results found for any visits on 07/27/23.    Assessment & Plan:    Problem List Items Addressed This Visit     DM (diabetes mellitus) (HCC)   Repeat hgbA1c, UACr and lipid panel today BP at goal No statin Use of ARB Eye exam completed No neuropathy or retinopathy. No medication at this time       Relevant Orders   Hemoglobin A1c    Microalbumin / creatinine urine ratio   Comprehensive metabolic panel   HTN (hypertension) - Primary   BP at goal with amlodipine, losartan, and propanolol BP Readings from Last 3 Encounters:  07/27/23 132/84  06/10/23 121/77  05/26/23 130/74    Maintain med doses Repeat CMP F/up in 6months      Relevant Medications   EPINEPHrine 0.3 mg/0.3 mL IJ SOAJ injection   Other Relevant Orders   Comprehensive metabolic panel   Hyperlipidemia associated with type 2 diabetes mellitus (HCC)   Repeat lipid panel Advised about importance of heart healthy diet and daily exercise      Relevant Medications   EPINEPHrine 0.3 mg/0.3 mL IJ SOAJ injection   Other Relevant Orders   Comprehensive metabolic panel   Lipid panel   Urticaria due to food allergy   Advised to maintain daily clartitn or zyrtec 1tab daily, keep benadryl 25mg  and epipen for prn use.      Relevant Medications   EPINEPHrine 0.3 mg/0.3 mL IJ SOAJ injection   Other Visit Diagnoses       Immunization due       Relevant Orders   Pneumococcal conjugate vaccine 20-valent (Prevnar 20) (Completed)      Return in about 6 months (around 01/27/2024) for HTN, DM, hyperlipidemia (fasting).     Alysia Penna, NP

## 2023-07-27 NOTE — Assessment & Plan Note (Signed)
 Advised to maintain daily clartitn or zyrtec 1tab daily, keep benadryl 25mg  and epipen for prn use.

## 2023-07-30 ENCOUNTER — Encounter: Payer: 59 | Admitting: Gastroenterology

## 2023-08-02 ENCOUNTER — Encounter: Payer: Self-pay | Admitting: Nurse Practitioner

## 2023-08-02 MED ORDER — ATORVASTATIN CALCIUM 20 MG PO TABS
20.0000 mg | ORAL_TABLET | Freq: Every day | ORAL | 3 refills | Status: AC
Start: 2023-08-02 — End: ?

## 2023-08-02 NOTE — Addendum Note (Signed)
 Addended by: Alysia Penna L on: 08/02/2023 12:55 PM   Modules accepted: Orders

## 2023-08-03 ENCOUNTER — Other Ambulatory Visit: Payer: Self-pay | Admitting: Nurse Practitioner

## 2023-08-03 DIAGNOSIS — J302 Other seasonal allergic rhinitis: Secondary | ICD-10-CM

## 2023-08-10 ENCOUNTER — Encounter: Payer: 59 | Admitting: Gastroenterology

## 2023-08-26 ENCOUNTER — Encounter: Payer: Self-pay | Admitting: Gastroenterology

## 2023-08-31 ENCOUNTER — Ambulatory Visit (AMBULATORY_SURGERY_CENTER): Admitting: Gastroenterology

## 2023-08-31 ENCOUNTER — Encounter: Payer: Self-pay | Admitting: Gastroenterology

## 2023-08-31 VITALS — BP 151/79 | HR 62 | Temp 98.1°F | Resp 15 | Ht 64.0 in | Wt 180.0 lb

## 2023-08-31 DIAGNOSIS — D122 Benign neoplasm of ascending colon: Secondary | ICD-10-CM

## 2023-08-31 DIAGNOSIS — Z1211 Encounter for screening for malignant neoplasm of colon: Secondary | ICD-10-CM

## 2023-08-31 DIAGNOSIS — K648 Other hemorrhoids: Secondary | ICD-10-CM

## 2023-08-31 MED ORDER — SODIUM CHLORIDE 0.9 % IV SOLN: 500.0000 mL | Freq: Once | INTRAVENOUS | Status: DC

## 2023-08-31 NOTE — Patient Instructions (Signed)
Thank you for letting us take care of your healthcare needs today. Please see handouts given to you on Polyps and Hemorrhoids.    YOU HAD AN ENDOSCOPIC PROCEDURE TODAY AT THE [facility]:   Refer to the procedure report that was given to you for any specific questions about what was found during the examination.  If the procedure report does not answer your questions, please call your gastroenterologist to clarify.  If you requested that your care partner not be given the details of your procedure findings, then the procedure report has been included in a sealed envelope for you to review at your convenience later.  YOU SHOULD EXPECT: Some feelings of bloating in the abdomen. Passage of more gas than usual.  Walking can help get rid of the air that was put into your GI tract during the procedure and reduce the bloating. If you had a lower endoscopy (such as a colonoscopy or flexible sigmoidoscopy) you may notice spotting of blood in your stool or on the toilet paper. If you underwent a bowel prep for your procedure, you may not have a normal bowel movement for a few days.  Please Note:  You might notice some irritation and congestion in your nose or some drainage.  This is from the oxygen used during your procedure.  There is no need for concern and it should clear up in a day or so.  SYMPTOMS TO REPORT IMMEDIATELY:  Following lower endoscopy (colonoscopy or flexible sigmoidoscopy):  Excessive amounts of blood in the stool  Significant tenderness or worsening of abdominal pains  Swelling of the abdomen that is new, acute  Fever of 100F or higher   For urgent or emergent issues, a gastroenterologist can be reached at any hour by calling [phone]. Do not use MyChart messaging for urgent concerns.    DIET:  We do recommend a small meal at first, but then you may proceed to your regular diet.  Drink plenty of fluids but you should avoid alcoholic beverages for 24  hours.  ACTIVITY:  You should plan to take it easy for the rest of today and you should NOT DRIVE or use heavy machinery until tomorrow (because of the sedation medicines used during the test).    FOLLOW UP: Our staff will call the number listed on your records the next business day following your procedure.  We will call around 7:15- 8:00 am to check on you and address any questions or concerns that you may have regarding the information given to you following your procedure. If we do not reach you, we will leave a message.     If any biopsies were taken you will be contacted by phone or by letter within the next 1-3 weeks.  Please call us at [phone] if you have not heard about the biopsies in 3 weeks.    SIGNATURES/CONFIDENTIALITY: You and/or your care partner have signed paperwork which will be entered into your electronic medical record.  These signatures attest to the fact that that the information above on your After Visit Summary has been reviewed and is understood.  Full responsibility of the confidentiality of this discharge information lies with you and/or your care-partner. 

## 2023-08-31 NOTE — Progress Notes (Signed)
 History and Physical:  This patient presents for endoscopic testing for: Encounter Diagnosis  Name Primary?   Special screening for malignant neoplasms, colon Yes    Average risk for colorectal cancer.  1st screening exam.  Father had colon polyps Patient denies chronic abdominal pain, rectal bleeding, or diarrhea. Tends to have constipation.  Patient is otherwise without complaints or active issues today.   Past Medical History: Past Medical History:  Diagnosis Date   Allergy     Anemia    iron supp   Anxiety    COVID 05/2020   Depression    Fibroids    GERD (gastroesophageal reflux disease)    omeprazole    Headache(784.0)    Hypertension 2012   Maxide   S/P LEEP 1992   Urticaria      Past Surgical History: Past Surgical History:  Procedure Laterality Date   ABDOMINAL HYSTERECTOMY  2014   Inicision and drainage of thrombosed hemorrhoid  04/06/2018   LEEP  1998   Abnormal pap   SUPRACERVICAL ABDOMINAL HYSTERECTOMY  01/18/2012   Procedure: HYSTERECTOMY SUPRACERVICAL ABDOMINAL with LSO;  Surgeon: Julianne Octave, MD;  Location: [facility];  Service: Gynecology;  Laterality: N/A;   TUBAL LIGATION  1997   WISDOM TOOTH EXTRACTION      Allergies: Allergies  Allergen Reactions   Shellfish Allergy  Itching and Swelling    Swollen tongue, itching   Tylox [Oxycodone -Acetaminophen ] Itching   Tyloxapol Other (See Comments)    Outpatient Meds: Current Outpatient Medications  Medication Sig Dispense Refill   amLODipine  (NORVASC ) 10 MG tablet TAKE 1 TABLET(10 MG) BY MOUTH DAILY 90 tablet 1   diphenhydrAMINE  (BENADRYL ) 25 MG tablet Take 25 mg by mouth as needed.     estradiol  (VIVELLE -DOT) 0.075 MG/24HR Place 1 patch onto the skin 2 (two) times a week. 24 patch 3   fluticasone  (FLONASE ) 50 MCG/ACT nasal spray SHAKE LIQUID AND USE 1 SPRAY IN EACH NOSTRIL DAILY 16 g 5   loratadine (CLARITIN) 10 MG tablet Take 10 mg by mouth daily.     losartan  (COZAAR ) 25 MG tablet TAKE 1  TABLET(25 MG) BY MOUTH DAILY 90 tablet 3   omeprazole  (PRILOSEC ) 20 MG capsule Take 1 capsule (20 mg total) by mouth daily. 90 capsule 3   propranolol  (INDERAL ) 10 MG tablet Take 1 tablet (10 mg total) by mouth 3 (three) times daily as needed. 270 tablet 1   Vitamin D , Ergocalciferol , 2000 units CAPS Take 1 capsule by mouth daily after breakfast. 90 capsule 1   atorvastatin  (LIPITOR) 20 MG tablet Take 1 tablet (20 mg total) by mouth daily. 90 tablet 3   EPINEPHrine  0.3 mg/0.3 mL IJ SOAJ injection Inject 0.3 mg into the muscle as needed for anaphylaxis. 2 each 1   meloxicam  (MOBIC ) 15 MG tablet Take 15 mg by mouth daily as needed.     SUMAtriptan  (IMITREX ) 25 MG tablet Take 1-2 tablets (25-50 mg total) by mouth every 2 (two) hours as needed for migraine. May repeat in 2 hours if headache persists or recurs. No more than 100mg  in 24hrs 10 tablet 0   triamcinolone  ointment (KENALOG ) 0.5 % Apply 1 application topically 2 (two) times daily. You may use for 2 weeks at a time as needed. 30 g 0   Current Facility-Administered Medications  Medication Dose Route Frequency Provider Last Rate Last Admin   0.9 %  sodium chloride  infusion  500 mL Intravenous Once Danis, Danyah Guastella L III, MD  ___________________________________________________________________ Objective   Exam:  BP (!) 151/95   Pulse 66   Temp 98.1 F (36.7 C)   Ht 5\' 4"  (1.626 m)   Wt 180 lb (81.6 kg)   LMP 01/18/2012 (Exact Date)   SpO2 99%   BMI 30.90 kg/m   CV: regular , S1/S2 Resp: clear to auscultation bilaterally, normal RR and effort noted GI: soft, no tenderness, with active bowel sounds.   Assessment: Encounter Diagnosis  Name Primary?   Special screening for malignant neoplasms, colon Yes     Plan: Colonoscopy   The benefits and risks of the planned procedure(s) were described in detail with the patient or (when appropriate) their health care proxy.  Risks were outlined as including, but not limited to,  bleeding, infection, perforation, adverse medication reaction leading to cardiac or pulmonary decompensation, pancreatitis (if ERCP).  The limitation of incomplete mucosal visualization was also discussed.  No guarantees or warranties were given.  The patient is appropriate for an endoscopic procedure in the ambulatory setting.   - Lorella Roles, MD

## 2023-08-31 NOTE — Progress Notes (Signed)
 Pt's states no medical or surgical changes since previsit or office visit.

## 2023-08-31 NOTE — Progress Notes (Signed)
 Sedate, gd SR, tolerated procedure well, VSS, report to RN

## 2023-08-31 NOTE — Progress Notes (Signed)
 Called to room to assist during endoscopic procedure.  Patient ID and intended procedure confirmed with present staff. Received instructions for my participation in the procedure from the performing physician.

## 2023-08-31 NOTE — Op Note (Signed)
 [facility] Patient Name: Abigail [name] Procedure Date: 08/31/2023 3:38 PM MRN: [id number] Endoscopist: Ace Abu L. Dominic Friendly , MD, [id number] Age: 53 Referring MD:  Date of Birth: [birth year] Gender: Female Account #: [id number] Procedure:                Colonoscopy Indications:              Screening for colorectal malignant neoplasm, This                            is the patient's first colonoscopy Medicines:                Monitored Anesthesia Care Procedure:                Pre-Anesthesia Assessment:                           - Prior to the procedure, a History and Physical                            was performed, and patient medications and                            allergies were reviewed. The patient's tolerance of                            previous anesthesia was also reviewed. The risks                            and benefits of the procedure and the sedation                            options and risks were discussed with the patient.                            All questions were answered, and informed consent                            was obtained. Prior Anticoagulants: The patient has                            taken no anticoagulant or antiplatelet agents. ASA                            Grade Assessment: II - A patient with mild systemic                            disease. After reviewing the risks and benefits,                            the patient was deemed in satisfactory condition to                            undergo the procedure.  After obtaining informed consent, the colonoscope                            was passed under direct vision. Throughout the                            procedure, the patient's blood pressure, pulse, and                            oxygen saturations were monitored continuously. The                            Olympus Scope SN: [id number] was introduced through                            the anus and advanced  to the the cecum, identified                            by appendiceal orifice and ileocecal valve. The                            colonoscopy was performed without difficulty. The                            patient tolerated the procedure well. The quality                            of the bowel preparation was good after lavage of                            some areas. The ileocecal valve, appendiceal                            orifice, and rectum were photographed. The bowel                            preparation used was SUPREP. Scope In: 3:59:23 PM Scope Out: 4:16:00 PM Scope Withdrawal Time: 0 hours 12 minutes 35 seconds  Total Procedure Duration: 0 hours 16 minutes 37 seconds  Findings:                 The perianal and digital rectal examinations were                            normal.                           Repeat examination of right colon under NBI                            performed.                           A diminutive polyp was found in the ascending  colon. The polyp was sessile. The polyp was removed                            with a cold snare. Resection and retrieval were                            complete.                           Internal hemorrhoids were found. The hemorrhoids                            were small.                           The exam was otherwise without abnormality on                            direct and retroflexion views. Complications:            No immediate complications. Estimated Blood Loss:     Estimated blood loss was minimal. Impression:               - One diminutive polyp in the ascending colon,                            removed with a cold snare. Resected and retrieved.                           - Internal hemorrhoids.                           - The examination was otherwise normal on direct                            and retroflexion views. Recommendation:           - Patient has a contact number  available for                            emergencies. The signs and symptoms of potential                            delayed complications were discussed with the                            patient. Return to normal activities tomorrow.                            Written discharge instructions were provided to the                            patient.                           - Resume previous diet.                           -  Continue present medications.                           - Await pathology results.                           - Repeat colonoscopy is recommended for                            surveillance. The colonoscopy date will be                            determined after pathology results from today's                            exam become available for review. Jameison Haji L. Dominic Friendly, MD 08/31/2023 4:20:42 PM This report has been signed electronically.

## 2023-09-01 ENCOUNTER — Telehealth: Payer: Self-pay

## 2023-09-01 ENCOUNTER — Ambulatory Visit: Admitting: Nurse Practitioner

## 2023-09-01 ENCOUNTER — Encounter: Payer: Self-pay | Admitting: Nurse Practitioner

## 2023-09-01 VITALS — BP 128/86 | HR 70 | Temp 97.2°F | Ht 65.0 in | Wt 180.0 lb

## 2023-09-01 DIAGNOSIS — L509 Urticaria, unspecified: Secondary | ICD-10-CM

## 2023-09-01 DIAGNOSIS — L209 Atopic dermatitis, unspecified: Secondary | ICD-10-CM

## 2023-09-01 MED ORDER — CLOTRIMAZOLE-BETAMETHASONE 1-0.05 % EX CREA: 1.0000 | TOPICAL_CREAM | Freq: Two times a day (BID) | CUTANEOUS | 0 refills | Status: DC

## 2023-09-01 NOTE — Telephone Encounter (Signed)
 Left message on follow up call.

## 2023-09-01 NOTE — Patient Instructions (Signed)
 Call office if rash does not improve in 2weeks Use unscented and no dye products  Rash, Adult  A rash is a breakout of spots or blotches on the skin. It can change the way your skin looks and feels. Many things can cause a rash. The goal of treatment is to stop the itching and keep the rash from spreading. Follow these instructions at home: Medicine Take or apply over-the-counter and prescription medicines only as told by your doctor. These may include medicines to treat: Red or swollen skin. Itching. An allergy . Pain. An infection.  Skin care Put a cool, wet cloth on the rash. Do not scratch or rub your skin. Try not to cover the rash. Keep it exposed to air as often as you can. Managing itching and discomfort Avoid hot showers or baths. These can make itching worse. A cold shower may help. Try taking a bath with: Epsom salts. You can get these at your pharmacy or grocery store. Follow the instructions on the package. Baking soda. Pour a small amount into the bath as told by your doctor. Colloidal oatmeal. You can get this at your pharmacy or grocery store. Follow the instructions on the package. Try putting baking soda paste on your skin. Stir water into baking soda until it gets like a paste. Try putting on a lotion to help with itching (calamine lotion). Keep cool. Stay out of the sun. Sweating and being hot can make itching worse. General instructions  Rest as needed. Drink enough fluid to keep your pee (urine) pale yellow. Wear loose-fitting clothes. Avoid scented soaps, detergents, and perfumes. Use gentle soaps, detergents, perfumes, and cosmetics. Avoid the things that cause your rash. Keep a journal to help keep track of what causes your rash. Write down: What you eat. What cosmetics you use. What you drink. What you wear. This includes jewelry. Contact a doctor if: You sweat a lot at night. You pee (urinate) more or less than normal. Your pee is a darker color  than normal. Your eyes are sensitive to light. Your skin or the white parts of your eyes turn yellow. Your skin tingles or is numb. You get painful blisters in your nose or mouth. Your rash does not go away after a few days, or it gets worse. You are more tired than normal. You are more thirsty than normal. You have new or worse symptoms. These may include: Pain in your belly. A fever. Watery poop (diarrhea). Vomiting. Weakness. Weight loss. Get help right away if: You start to feel mixed up (confused). You have a very bad headache or a stiff neck. You have very bad joint pain or stiffness. You get very sleepy or not responsive. You have a seizure. This information is not intended to replace advice given to you by your health care provider. Make sure you discuss any questions you have with your health care provider. Document Revised: 02/13/2022 Document Reviewed: 02/13/2022 Elsevier Patient Education  2024 [employer].

## 2023-09-01 NOTE — Progress Notes (Signed)
 Established Patient Visit  Patient: Abigail [name]   DOB: 01/02/1971   53 y.o. Female  MRN: [id number] Visit Date: 09/01/2023  Subjective:    Chief Complaint  Patient presents with   Rash    Rash was noticed 2-3 weeks ago. Just on forearms.  States itchyness.   Rash This is a recurrent problem. The current episode started 1 to 4 weeks ago. The problem has been waxing and waning since onset. The affected locations include the right arm and left arm. The rash is characterized by itchiness. It is unknown if there was an exposure to a precipitant. Pertinent negatives include no congestion, cough, eye pain, facial edema, fatigue, joint pain, nail changes, rhinorrhea, shortness of breath or sore throat. Past treatments include antihistamine. The treatment provided no relief. Her past medical history is significant for allergies. There is no history of asthma, eczema or varicella.   Reviewed medical, surgical, and social history today  Medications: Outpatient Medications Prior to Visit  Medication Sig   amLODipine  (NORVASC ) 10 MG tablet TAKE 1 TABLET(10 MG) BY MOUTH DAILY   atorvastatin  (LIPITOR) 20 MG tablet Take 1 tablet (20 mg total) by mouth daily.   diphenhydrAMINE  (BENADRYL ) 25 MG tablet Take 25 mg by mouth as needed.   EPINEPHrine  0.3 mg/0.3 mL IJ SOAJ injection Inject 0.3 mg into the muscle as needed for anaphylaxis.   estradiol  (VIVELLE -DOT) 0.075 MG/24HR Place 1 patch onto the skin 2 (two) times a week.   fluticasone  (FLONASE ) 50 MCG/ACT nasal spray SHAKE LIQUID AND USE 1 SPRAY IN EACH NOSTRIL DAILY   loratadine (CLARITIN) 10 MG tablet Take 10 mg by mouth daily.   losartan  (COZAAR ) 25 MG tablet TAKE 1 TABLET(25 MG) BY MOUTH DAILY   meloxicam  (MOBIC ) 15 MG tablet Take 15 mg by mouth daily as needed.   omeprazole  (PRILOSEC ) 20 MG capsule Take 1 capsule (20 mg total) by mouth daily.   propranolol  (INDERAL ) 10 MG tablet Take 1 tablet (10 mg total) by mouth 3 (three) times  daily as needed.   SUMAtriptan  (IMITREX ) 25 MG tablet Take 1-2 tablets (25-50 mg total) by mouth every 2 (two) hours as needed for migraine. May repeat in 2 hours if headache persists or recurs. No more than 100mg  in 24hrs   Vitamin D , Ergocalciferol , 2000 units CAPS Take 1 capsule by mouth daily after breakfast.   [DISCONTINUED] triamcinolone  ointment (KENALOG ) 0.5 % Apply 1 application topically 2 (two) times daily. You may use for 2 weeks at a time as needed.   No facility-administered medications prior to visit.   Reviewed past medical and social history.   ROS per HPI above      Objective:  BP 128/86   Pulse 70   Temp (!) 97.2 F (36.2 C) (Temporal)   Ht 5\' 5"  (1.651 m)   Wt 180 lb (81.6 kg)   LMP 01/18/2012 (Exact Date)   SpO2 97%   BMI 29.95 kg/m      Physical Exam Vitals and nursing note reviewed.  Skin:    Findings: Rash present. Rash is papular.  Neurological:     Mental Status: She is alert.     No results found for any visits on 09/01/23.    Assessment & Plan:    Problem List Items Addressed This Visit   None Visit Diagnoses       Atopic dermatitis, unspecified type    -  Primary  Relevant Medications   clotrimazole -betamethasone  (LOTRISONE ) cream   Other Relevant Orders   Ambulatory referral to Allergy      Urticaria       Relevant Orders   Ambulatory referral to Allergy       Return if symptoms worsen or fail to improve.     Kathrene Parents, NP

## 2023-09-03 LAB — SURGICAL PATHOLOGY

## 2023-09-07 ENCOUNTER — Encounter: Payer: Self-pay | Admitting: Gastroenterology

## 2023-10-14 ENCOUNTER — Encounter: Payer: Self-pay | Admitting: Nurse Practitioner

## 2023-10-15 ENCOUNTER — Other Ambulatory Visit: Payer: Self-pay | Admitting: Nurse Practitioner

## 2023-10-15 DIAGNOSIS — K219 Gastro-esophageal reflux disease without esophagitis: Secondary | ICD-10-CM

## 2023-10-21 ENCOUNTER — Encounter: Payer: Self-pay | Admitting: Internal Medicine

## 2023-10-21 ENCOUNTER — Ambulatory Visit: Admitting: Internal Medicine

## 2023-10-21 VITALS — BP 126/78 | HR 84 | Temp 98.7°F | Resp 20 | Ht 65.5 in | Wt 182.0 lb

## 2023-10-21 DIAGNOSIS — T7800XD Anaphylactic reaction due to unspecified food, subsequent encounter: Secondary | ICD-10-CM

## 2023-10-21 DIAGNOSIS — J3089 Other allergic rhinitis: Secondary | ICD-10-CM

## 2023-10-21 DIAGNOSIS — L508 Other urticaria: Secondary | ICD-10-CM

## 2023-10-21 DIAGNOSIS — J302 Other seasonal allergic rhinitis: Secondary | ICD-10-CM

## 2023-10-21 DIAGNOSIS — T7800XA Anaphylactic reaction due to unspecified food, initial encounter: Secondary | ICD-10-CM | POA: Insufficient documentation

## 2023-10-21 MED ORDER — TRIAMCINOLONE ACETONIDE 0.1 % EX OINT: TOPICAL_OINTMENT | CUTANEOUS | 1 refills | Status: DC

## 2023-10-21 NOTE — Patient Instructions (Addendum)
 Chronic spontaneous urticaria Intermittent hives and pruritus, likely autoimmune. Symptoms mild, resolve with antihistamines. No consistent allergic trigger identified. Explained non-allergic triggers such as pressure, temperature changes, and stress among many others. - Recommend daily antihistamine use or as needed during episodes. Your options include: Zyrtec (cetirizine) 10 mg, Claritin (loratadine) 10 mg, Xyzal (levocetirizine) 5 mg or Allegra  (fexofenadine ) 180 mg daily as needed. Can increase to a maximum of 2 tablets twice daily during flares. - Schedule skin testing for environmental and food allergens on June 20th at 10:30 AM. - Order blood work for thyroid  function and autoimmune markers.  Rash:  Daily Care For Maintenance (daily and continue even once eczema controlled) - Use hypoallergenic hydrating ointment at least twice daily.  This must be done daily for control of flares. (Great options include Vaseline, CeraVe, Aquaphor, Aveeno, Cetaphil, VaniCream, etc) - Avoid detergents, soaps or lotions with fragrances/dyes - Limit showers/baths to 5 minutes and use luke warm water instead of hot, pat dry following baths, and apply moisturizer - can use steroid/non-steroid therapy creams as detailed below up to twice weekly for prevention of flares. For Flares:(add this to maintenance therapy if needed for flares) First apply steroid/non-steroid treatment creams. Wait 5 minutes then apply moisturizer.  - Triamcinolone  0.1% to body for moderate flares-apply topically twice daily to red, raised areas of skin, followed by moisturizer. Do NOT use on face, groin or armpits. - Provide samples of Vanicream and Cetaphil for sensitive skin care.  Allergy  to shellfish Confirmed shellfish allergy  with ingestion reactions. Advised potential for anaphylaxis and necessity of EpiPen . - Advise continued avoidance of shellfish.  - Ensure she carries an EpiPen  at all times.  Allergic rhinitis: Continue  Flonase  1 spray in each nostril daily. Return for environmental allergy  testing update.  Follow-up - Schedule follow-up appointment for skin testing on June 20th at 10:30 AM. (1-68, shellfish) - Instruct to avoid antihistamines before the skin testing appointment.

## 2023-10-21 NOTE — Progress Notes (Signed)
 NEW PATIENT Date of Service/Encounter:   10/21/2023 Referring provider: Kandace Organ, NP Primary care provider: Kandace Organ, NP  Subjective:  Abigail [name] is a 53 y.o. female with a PMHx of hypertension, diabetes mellitus, GERD, vitamin D  deficiency, migraines, chronic idiopathic constipation, hyperlipidemia presenting today for evaluation of recurrent urticaria, shellfish allergy  and allergic rhinitis History obtained from: chart review and patient.   Discussed the use of AI scribe software for clinical note transcription with the patient, who gave verbal consent to proceed.  Discussed the use of AI scribe software for clinical note transcription with the patient, who gave verbal consent to proceed.  History of Present Illness   Abigail [name] is a 53 year old female with a history of allergic reactions who presents with recurrent rashes and hives.  She experiences recurrent rashes and allergic reactions, with the most recent episodes occurring approximately two months ago. These episodes involve welts primarily on her face, around her eyes, accompanied by itchy eyes and skin. The welts are sometimes fluid-filled.  The first recent episode occurred at the gym after using a spray cleaner, resolving within two hours after taking an antihistamine. The second episode happened at work, with symptoms of itchy eyes and facial redness, which resolved after a nurse administered famotidine  and an antihistamine.  She mentions sporadic episodes of similar reactions, including one instance of mild throat tightness, relieved by Benadryl  within an hour. These reactions occur every couple of months without a consistent trigger.  She recalls a past consultation in 2018 for similar issues, where a histamine release assay was performed and elevated. She has a known shellfish allergy  and avoids shellfish, although she can be around it without reacting to the smell. She has tried eating shrimp  in the past, resulting in symptoms on multiple repeat occasions, and she avoids it despite her fondness for it. She can eat salmon without issues. She denies any specific food or environmental triggers for her reactions, describing them as random.  She uses Flonase  for sinus headaches, which she finds helpful. She has a history of environmental allergies, including dust mites, cockroach, cedar tree, oak tree, and nettle.  She reports a recent development of eczema-like symptoms on her arms and hands, described as little bumps that itch occasionally. She has used a steroid/fungal cream without improvement. The rash has persisted for about two months.  She experiences regular constipation and dry skin, particularly on her legs, which she attributes to aging.      Chart Review:  Evaluated in 2018 by Dr. Tempie Fee. Allergic reactions/adverse food reaction    - Reactions appear to be related to food ingestion with 2 of the occasions having eaten shellfish.  However she also reports having a hive-like rash that is intermittent which may mean that she could be having idiopathic type reactions.    -  At this time continue avoidance of shrimp/shellfish until labs return    - will obtain serum IgE levels to shellfish panel and alpha-gal panel (given pork ingestion and unknown if the collard green reaction had any meets cooked in).  Also obtain tryptase level, ESR, CRP, hive panel and environmental allergen panel. Physical urticaria testing done with use of tongue depressor on the forearm with immediate erythema and mild wheal.  2018 labs: low TSH, normal T4, negative alphagal, positive IgE testing to shrim 10.3, crab 12.7, lobster 11.5 and clams 2.38, normal CRP, elevated histamine release, normal tryptase 8.3, zone 2 positive dust mites, cockroach, cedar tree, oak,  nettle   Past Medical History: Past Medical History:  Diagnosis Date   Allergy     Anemia    iron supp   Anxiety    COVID 05/2020    Depression    Fibroids    GERD (gastroesophageal reflux disease)    omeprazole    Headache(784.0)    Hypertension 2012   Maxide   S/P LEEP 1992   Urticaria    Medication List:  Current Outpatient Medications  Medication Sig Dispense Refill   amLODipine  (NORVASC ) 10 MG tablet TAKE 1 TABLET(10 MG) BY MOUTH DAILY 90 tablet 1   atorvastatin  (LIPITOR) 20 MG tablet Take 1 tablet (20 mg total) by mouth daily. 90 tablet 3   clotrimazole -betamethasone  (LOTRISONE ) cream Apply 1 Application topically 2 (two) times daily. 30 g 0   diphenhydrAMINE  (BENADRYL ) 25 MG tablet Take 25 mg by mouth as needed.     EPINEPHrine  0.3 mg/0.3 mL IJ SOAJ injection Inject 0.3 mg into the muscle as needed for anaphylaxis. 2 each 1   estradiol  (VIVELLE -DOT) 0.075 MG/24HR Place 1 patch onto the skin 2 (two) times a week. 24 patch 3   fluticasone  (FLONASE ) 50 MCG/ACT nasal spray SHAKE LIQUID AND USE 1 SPRAY IN EACH NOSTRIL DAILY 16 g 5   loratadine (CLARITIN) 10 MG tablet Take 10 mg by mouth daily.     losartan  (COZAAR ) 25 MG tablet TAKE 1 TABLET(25 MG) BY MOUTH DAILY 90 tablet 3   meloxicam  (MOBIC ) 15 MG tablet Take 15 mg by mouth daily as needed.     omeprazole  (PRILOSEC ) 20 MG capsule TAKE 1 CAPSULE(20 MG) BY MOUTH DAILY 90 capsule 1   propranolol  (INDERAL ) 10 MG tablet Take 1 tablet (10 mg total) by mouth 3 (three) times daily as needed. 270 tablet 1   SUMAtriptan  (IMITREX ) 25 MG tablet Take 1-2 tablets (25-50 mg total) by mouth every 2 (two) hours as needed for migraine. May repeat in 2 hours if headache persists or recurs. No more than 100mg  in 24hrs 10 tablet 0   Vitamin D , Ergocalciferol , 2000 units CAPS Take 1 capsule by mouth daily after breakfast. 90 capsule 1   No current facility-administered medications for this visit.   Known Allergies:  Allergies  Allergen Reactions   Shellfish Allergy  Itching and Swelling    Swollen tongue, itching   Tylox [Oxycodone -Acetaminophen ] Itching   Tyloxapol Other (See  Comments)   Past Surgical History: Past Surgical History:  Procedure Laterality Date   ABDOMINAL HYSTERECTOMY  2014   Inicision and drainage of thrombosed hemorrhoid  04/06/2018   LEEP  1998   Abnormal pap   SUPRACERVICAL ABDOMINAL HYSTERECTOMY  01/18/2012   Procedure: HYSTERECTOMY SUPRACERVICAL ABDOMINAL with LSO;  Surgeon: Julianne Octave, MD;  Location: [facility];  Service: Gynecology;  Laterality: N/A;   TUBAL LIGATION  1997   WISDOM TOOTH EXTRACTION     Family History: Family History  Problem Relation Age of Onset   Hypertension Mother    Arthritis Mother    Pulmonary fibrosis Mother    Colon polyps Father    Hypertension Father    Depression Father    Diabetes Father    Alcohol abuse Father    Colonic polyp Father    Diabetes Sister    Congestive Heart Failure Sister    Anxiety disorder Sister    Hypertension Sister    Arthritis Sister    Diabetes Brother    Breast cancer Paternal Grandmother    Heart attack Paternal Grandmother  Breast cancer Daughter 39   Cancer Daughter    Breast cancer Cousin        1st cousin, paternal   Allergic rhinitis Neg Hx    Angioedema Neg Hx    Asthma Neg Hx    Eczema Neg Hx    Immunodeficiency Neg Hx    Urticaria Neg Hx    Colon cancer Neg Hx    Esophageal cancer Neg Hx    Rectal cancer Neg Hx    Stomach cancer Neg Hx    Social History: [place]een lives in a townhouse built 4 years ago, no water damage, carpet in the bedroom, electric heating, central AC, no pets, no roaches, not using dust mite covers on the bed and the pillows, no smoke exposure.  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  [place]   ROS:  All other systems negative except as noted per HPI.  Objective:  Blood pressure 126/78, pulse 84, temperature 98.7 F (37.1 C), temperature source Oral, resp. rate 20, height 5' 5.5 (1.664 m), weight 182 lb (82.6 kg), last menstrual period  01/18/2012, SpO2 100%. Body mass index is 29.83 kg/m. Physical Exam:  General Appearance:  Alert, cooperative, no distress, appears stated age  Head:  Normocephalic, without obvious abnormality, atraumatic  Eyes:  Conjunctiva clear, EOM's intact  Ears EACs normal bilaterally and normal TMs bilaterally  Nose: Nares normal, hypertrophic turbinates, normal mucosa, and no visible anterior polyps  Throat: Lips, tongue normal; teeth and gums normal, normal posterior oropharynx  Neck: Supple, symmetrical  Lungs:   clear to auscultation bilaterally, Respirations unlabored, no coughing  Heart:  regular rate and rhythm and no murmur, Appears well perfused  Extremities: No edema  Skin: Fine erythematous papules some linear and some flat on bilateral forearms  Neurologic: No gross deficits   Diagnostics:  Labs:  Lab Orders         Other/Misc lab test         IgE         TSH + free T4         Thyroid  Panel With TSH       Assessment and Plan  Assessment and Plan    Chronic spontaneous urticaria Intermittent hives and pruritus, likely autoimmune. Symptoms mild, resolve with antihistamines. No consistent allergic trigger identified. Explained non-allergic triggers such as pressure, temperature changes, and stress among many others. - Recommend daily antihistamine use or as needed during episodes. Your options include: Zyrtec (cetirizine) 10 mg, Claritin (loratadine) 10 mg, Xyzal (levocetirizine) 5 mg or Allegra  (fexofenadine ) 180 mg daily as needed. Can increase to a maximum of 2 tablets twice daily during flares. - Schedule skin testing for environmental and food allergens on June 20th at 10:30 AM. - Order blood work for thyroid  function and autoimmune markers.  Rash:  Daily Care For Maintenance (daily and continue even once eczema controlled) - Use hypoallergenic hydrating ointment at least twice daily.  This must be done daily for control of flares. (Great options include Vaseline, CeraVe,  Aquaphor, Aveeno, Cetaphil, VaniCream, etc) - Avoid detergents, soaps or lotions with fragrances/dyes - Limit showers/baths to 5 minutes and use luke warm water instead of hot, pat dry following baths, and apply moisturizer - can use steroid/non-steroid therapy creams as detailed below up to twice weekly for prevention of flares. For Flares:(add this to maintenance therapy if needed for flares) First apply steroid/non-steroid treatment creams. Wait 5  minutes then apply moisturizer.  - Triamcinolone  0.1% to body for moderate flares-apply topically twice daily to red, raised areas of skin, followed by moisturizer. Do NOT use on face, groin or armpits. - Provide samples of Vanicream and Cetaphil for sensitive skin care.  Allergy  to shellfish Confirmed shellfish allergy  with ingestion reactions. Advised potential for anaphylaxis and necessity of EpiPen . - Advise continued avoidance of shellfish.  - Ensure she carries an EpiPen  at all times.  Allergic rhinitis: Continue Flonase  1 spray in each nostril daily. Return for environmental allergy  testing update.  Follow-up - Schedule follow-up appointment for skin testing on June 20th at 10:30 AM. (1-68, shellfish) - Instruct to avoid antihistamines before the skin testing appointment.         This note in its entirety was forwarded to the Provider who requested this consultation.  Other: samples provided of: Vanicream and Cetaphil  Thank you for your kind referral. I appreciate the opportunity to take part in Shelagh's care. Please do not hesitate to contact me with questions.  Sincerely,  Jonathon Neighbors, MD [facility] 

## 2023-10-22 ENCOUNTER — Ambulatory Visit: Payer: Self-pay | Admitting: Internal Medicine

## 2023-10-22 NOTE — Progress Notes (Signed)
 Cna we send her TSH levels to her PCP? Thanks

## 2023-10-29 ENCOUNTER — Ambulatory Visit: Admitting: Internal Medicine

## 2023-10-29 ENCOUNTER — Encounter: Payer: Self-pay | Admitting: Internal Medicine

## 2023-10-29 DIAGNOSIS — T7800XD Anaphylactic reaction due to unspecified food, subsequent encounter: Secondary | ICD-10-CM

## 2023-10-29 DIAGNOSIS — J302 Other seasonal allergic rhinitis: Secondary | ICD-10-CM

## 2023-10-29 DIAGNOSIS — T7800XA Anaphylactic reaction due to unspecified food, initial encounter: Secondary | ICD-10-CM

## 2023-10-29 DIAGNOSIS — J3089 Other allergic rhinitis: Secondary | ICD-10-CM | POA: Diagnosis not present

## 2023-10-29 DIAGNOSIS — L508 Other urticaria: Secondary | ICD-10-CM

## 2023-10-29 DIAGNOSIS — R21 Rash and other nonspecific skin eruption: Secondary | ICD-10-CM

## 2023-10-29 NOTE — Progress Notes (Signed)
 Date of Service/Encounter:  10/29/23  Allergy  testing appointment   Initial visit on 10/21/23, seen for chronic urticaria, rash, shellfish allergy , allergic rhinitis.  Please see that note for additional details.  Today reports for allergy  diagnostic testing:    DIAGNOSTICS:  Skin Testing: Environmental allergy  panel and select foods. Adequate positive and negative controls. Results discussed with patient/family.  Airborne Adult Perc - 10/29/23 1101     Time Antigen Placed 1100    Allergen Manufacturer Floyd Hutchinson    Location Back    Number of Test 55    1. Control-Buffer 50% Glycerol Negative    2. Control-Histamine 3+    3. Bahia Negative    4. French Southern Territories Negative    5. Johnson Negative    6. [place]  Blue Negative    7. Meadow Fescue Negative    8. Perennial Rye Negative    9. Timothy Negative    10. Ragweed Mix Negative    11. Cocklebur Negative    12. Plantain,  English Negative    13. Baccharis Negative    14. Dog Fennel Negative    15. Russian Thistle Negative    16. Lamb's Quarters Negative    17. Sheep Sorrell Negative    18. Rough Pigweed Negative    19. Marsh Elder, Rough Negative    20. Mugwort, Common Negative    21. Box, Elder Negative    22. Cedar, red Negative    23. Sweet Gum Negative    24. Pecan Pollen Negative    25. Pine Mix Negative    26. Walnut, Black Pollen Negative    27. Red Mulberry Negative    28. Ash Mix Negative    29. Birch Mix Negative    30. Beech American Negative    31. Cottonwood, Guinea-Bissau Negative    32. Hickory, White Negative    33. Maple Mix Negative    34. Oak, [place] Mix Negative    35. Sycamore Eastern Negative    36. Alternaria Alternata Negative    37. Cladosporium Herbarum Negative    38. Aspergillus Mix Negative    39. Penicillium Mix Negative    40. Bipolaris Sorokiniana (Helminthosporium) Negative    41. Drechslera Spicifera (Curvularia) Negative    42. Mucor Plumbeus Negative    43. Fusarium Moniliforme Negative     44. Aureobasidium Pullulans (pullulara) Negative    45. Rhizopus Oryzae Negative    46. Botrytis Cinera Negative    47. Epicoccum Nigrum Negative    48. Phoma Betae Negative    49. Dust Mite Mix Negative    50. Cat Hair 10,000 BAU/ml Negative    51.  Dog Epithelia Negative    52. Mixed Feathers Negative    53. Horse Epithelia Negative    54. Cockroach, German 3+    55. Tobacco Leaf Negative          13 Food Perc - 10/29/23 1102       Test Information   Time Antigen Placed 1100    Allergen Manufacturer Greer    Location Back    Number of allergen test 13      Food   1. Peanut Negative    2. Soybean Negative    3. Wheat Negative    4. Sesame Negative    5. Milk, Cow Negative    6. Casein Negative    7. Egg White, Chicken Negative    8. Shellfish Mix Negative   8x12   9. Fish Mix Negative  10. Cashew Negative    11. Walnut Food Negative    12. Almond Negative    13. Hazelnut Negative          Food Adult Perc - 10/29/23 1100     Time Antigen Placed 1100    Allergen Manufacturer Floyd Hutchinson    Location Back    Number of allergen test 5    23. Shrimp --   9x16   24. Crab Negative    25. Lobster --   6x12   26. Oyster --   8x15   27. Scallops --   12x24         Allergy  testing results were read and interpreted by myself, documented by clinical staff.  Patient provided with copy of allergy  testing along with avoidance measures when indicated.   Jonathon Neighbors, MD  [facility] 

## 2023-10-29 NOTE — Patient Instructions (Addendum)
 Chronic spontaneous urticaria Intermittent hives and pruritus, likely autoimmune. Symptoms mild, resolve with antihistamines. No consistent allergic trigger identified. Explained non-allergic triggers such as pressure, temperature changes, and stress among many others. - Recommend daily antihistamine use or as needed during episodes. Your options include: Zyrtec (cetirizine) 10 mg, Claritin (loratadine) 10 mg, Xyzal (levocetirizine) 5 mg or Allegra  (fexofenadine ) 180 mg daily as needed. Can increase to a maximum of 2 tablets twice daily during flares. - labs show a low TSH with normal T4, concerning for possible thyroid  disease. Please follow-up with PCP - she has been notified of your results. You do have elevated IgE (allergic antibody). If needed, suspect would respond well to Xolair. Will consider if not controlled with high-dose antihistamines.   Rash:  Daily Care For Maintenance (daily and continue even once eczema controlled) - Use hypoallergenic hydrating ointment at least twice daily.  This must be done daily for control of flares. (Great options include Vaseline, CeraVe, Aquaphor, Aveeno, Cetaphil, VaniCream, etc) - Avoid detergents, soaps or lotions with fragrances/dyes - Limit showers/baths to 5 minutes and use luke warm water instead of hot, pat dry following baths, and apply moisturizer - can use steroid/non-steroid therapy creams as detailed below up to twice weekly for prevention of flares. For Flares:(add this to maintenance therapy if needed for flares) First apply steroid/non-steroid treatment creams. Wait 5 minutes then apply moisturizer.  - Triamcinolone  0.1% to body for moderate flares-apply topically twice daily to red, raised areas of skin, followed by moisturizer. Do NOT use on face, groin or armpits. - Provide samples of Vanicream and Cetaphil for sensitive skin care.  Will refer to dermatology-partial response to triamcinolone .   Allergy  to shellfish Confirmed shellfish  allergy  with ingestion reactions. Advised potential for anaphylaxis and necessity of EpiPen . - Advise continued avoidance of shellfish.  - Ensure she carries an EpiPen  at all times. - skin testing shellfish 10/29/23: positive to shellfish mix, individual shellfish.Avoidance advised.  Allergic rhinitis to cockroach. Continue Flonase  1 spray in each nostril daily. Skin testing environmental allergies 10/29/23: positive to cockroach only.   Follow-up 3 months, sooner if needed.  It was a pleasure seeing you again in clinic today! Thank you for allowing me to participate in your care.  Jonathon Neighbors, MD [facility]  Control of Cockroach Allergen  Cockroach allergen has been identified as an important cause of acute attacks of asthma, especially in urban settings.  There are fifty-five species of cockroach that exist in the [place] , however only three, the [place], [place] and [place] species produce allergen that can affect patients with Asthma.  Allergens can be obtained from fecal particles, egg casings and secretions from cockroaches.    Remove food sources. Reduce access to water. Seal access and entry points. Spray runways with 0.5-1% Diazinon or Chlorpyrifos Blow boric acid power under stoves and refrigerator. Place bait stations (hydramethylnon) at feeding sites.  Airborne Adult Perc - 10/29/23 1101     Time Antigen Placed 1100    Allergen Manufacturer Floyd Hutchinson    Location Back    Number of Test 55    1. Control-Buffer 50% Glycerol Negative    2. Control-Histamine 3+    3. Bahia Negative    4. French Southern Territories Negative    5. Johnson Negative    6. [place]  Blue Negative    7. Meadow Fescue Negative    8. Perennial Rye Negative    9. Timothy Negative    10. Ragweed Mix Negative  11. Cocklebur Negative    12. Plantain,  English Negative    13. Baccharis Negative    14. Dog Fennel Negative    15. Russian Thistle Negative    16. Lamb's Quarters Negative     17. Sheep Sorrell Negative    18. Rough Pigweed Negative    19. Marsh Elder, Rough Negative    20. Mugwort, Common Negative    21. Box, Elder Negative    22. Cedar, red Negative    23. Sweet Gum Negative    24. Pecan Pollen Negative    25. Pine Mix Negative    26. Walnut, Black Pollen Negative    27. Red Mulberry Negative    28. Ash Mix Negative    29. Birch Mix Negative    30. Beech American Negative    31. Cottonwood, Guinea-Bissau Negative    32. Hickory, White Negative    33. Maple Mix Negative    34. Oak, [place] Mix Negative    35. Sycamore Eastern Negative    36. Alternaria Alternata Negative    37. Cladosporium Herbarum Negative    38. Aspergillus Mix Negative    39. Penicillium Mix Negative    40. Bipolaris Sorokiniana (Helminthosporium) Negative    41. Drechslera Spicifera (Curvularia) Negative    42. Mucor Plumbeus Negative    43. Fusarium Moniliforme Negative    44. Aureobasidium Pullulans (pullulara) Negative    45. Rhizopus Oryzae Negative    46. Botrytis Cinera Negative    47. Epicoccum Nigrum Negative    48. Phoma Betae Negative    49. Dust Mite Mix Negative    50. Cat Hair 10,000 BAU/ml Negative    51.  Dog Epithelia Negative    52. Mixed Feathers Negative    53. Horse Epithelia Negative    54. Cockroach, German 3+    55. Tobacco Leaf Negative          13 Food Perc - 10/29/23 1102       Test Information   Time Antigen Placed 1100    Allergen Manufacturer Greer    Location Back    Number of allergen test 13      Food   1. Peanut Negative    2. Soybean Negative    3. Wheat Negative    4. Sesame Negative    5. Milk, Cow Negative    6. Casein Negative    7. Egg White, Chicken Negative    8. Shellfish Mix Negative   8x12   9. Fish Mix Negative    10. Cashew Negative    11. Walnut Food Negative    12. Almond Negative    13. Hazelnut Negative          Food Adult Perc - 10/29/23 1100     Time Antigen Placed 1100    Allergen Manufacturer  Floyd Hutchinson    Location Back    Number of allergen test 5    23. Shrimp --   9x16   24. Crab Negative    25. Lobster --   6x12   26. Oyster --   8x15   27. Scallops --   12x24

## 2023-11-01 ENCOUNTER — Telehealth: Payer: Self-pay | Admitting: Internal Medicine

## 2023-11-01 NOTE — Telephone Encounter (Signed)
 Angelmarie has been internally referred to [facility].  They will reach out to the patient to schedule.  I will follow up in a week.

## 2023-11-04 LAB — TSH+FREE T4
Free T4: 1.03 ng/dL (ref 0.82–1.77)
TSH: 0.301 u[IU]/mL — ABNORMAL LOW (ref 0.450–4.500)

## 2023-11-04 LAB — IGE: IgE (Immunoglobulin E), Serum: 743 [IU]/mL — ABNORMAL HIGH (ref 6–495)

## 2023-11-04 LAB — THYROID PANEL WITH TSH
Free Thyroxine Index: 1.8 (ref 1.2–4.9)
T3 Uptake Ratio: 24 % (ref 24–39)
T4, Total: 7.6 ug/dL (ref 4.5–12.0)

## 2023-11-04 LAB — CHRONIC URTICARIA PD-BAT: Pooled Donor- BAT CU: 5 % (ref 0.00–10.60)

## 2023-11-04 NOTE — Progress Notes (Signed)
 Please let Abigail [name] know that all of her labs have now returned. Everything is reassuring except that her TSH is low which should be followed by her primary care physician.  She has already been made aware of this result. Her total IgE is elevated which is allergic antibody. If her hives are not controlled with high-dose antihistamine, she would likely respond well to Xolair based on her results.  Xolair is an injectable medicine for people with chronic urticaria (hives).

## 2023-11-09 ENCOUNTER — Encounter: Payer: Self-pay | Admitting: Internal Medicine

## 2023-11-14 ENCOUNTER — Encounter: Payer: Self-pay | Admitting: Nurse Practitioner

## 2023-11-14 DIAGNOSIS — L209 Atopic dermatitis, unspecified: Secondary | ICD-10-CM

## 2023-11-14 DIAGNOSIS — L509 Urticaria, unspecified: Secondary | ICD-10-CM

## 2023-11-23 ENCOUNTER — Encounter (HOSPITAL_COMMUNITY): Payer: Self-pay | Admitting: Psychiatry

## 2023-11-23 ENCOUNTER — Telehealth (HOSPITAL_BASED_OUTPATIENT_CLINIC_OR_DEPARTMENT_OTHER): Admitting: Psychiatry

## 2023-11-23 DIAGNOSIS — F410 Panic disorder [episodic paroxysmal anxiety] without agoraphobia: Secondary | ICD-10-CM | POA: Diagnosis not present

## 2023-11-23 MED ORDER — PROPRANOLOL HCL 10 MG PO TABS: 10.0000 mg | ORAL_TABLET | Freq: Three times a day (TID) | ORAL | 1 refills | Status: DC | PRN

## 2023-11-23 NOTE — Progress Notes (Signed)
 [facility] MD/PA/NP OP Progress Note  11/23/2023 11:00 AM Abigail [name]  MRN:  [id number]  Visit Diagnosis:    ICD-10-CM   1. Panic disorder  F41.0 propranolol  (INDERAL ) 10 MG tablet       Assessment: Abigail [name] is a 53 y.o. female with a history of GAD, Vitamin D  deficiency  who presented to [facility] at [facility] for initial evaluation on 06/19/2022.  During initial evaluation patient reported experiencing symptoms of adrenaline rush, racing heart, flushed feeling, occasional blood pressure elevations, and feelings of antsiness that have been occurring for around a year now.  Symptoms occur secondary to stressful, emotional, or exciting situations and can lead to some minor anxiety that there is something wrong with her.  Episodes resolve after she is able to breathe for a few minutes.  Of note patient has a past history of panic attacks which presented with palpitations, fatigue, hypertension, and fear of something awful happening.  She describes these current episodes as different from her prior panic attacks.  Patient denied any depressed mood, anxiety, psychosis, paranoia, delusions, or history of mania.  She did endorse a past history of trauma though denies symptoms or symptoms consistent with PTSD.  Patient met criteria for panic disorder.  Abigail [name] presents for follow-up evaluation. Today, 11/23/23, patient reports that anxiety symptoms have been stable over the past year.  She continues to take the propranolol  without adverse effect.  Patient will typically use the propranolol  2 times a day during the week and 3 times a day during the weekend.  We will continue current regimen and follow up in 9 months.  Plan: - Continue Propranolol  10 mg TID prn anxiety  - Can consider therapy in the future - Crisis resources reviewed - Follow up in 9 months   Chief Complaint:  Chief Complaint  Patient presents with   Follow-up   HPI: Abigail [name] presents reporting the  past year. She reports that everything has been going fairly well for her over this year. The anxiety and panic have been better controlled with the propranolol . Recently she has had a slight increase in anxiety but that was related to trying to conserve the propranolol . She would only take it once a day at that time.  Prior to that she would typically take it twice a day during the week and 3 times a day during the weekend.  Patient denies any notable adverse side effects from the propranolol .  Past Psychiatric History: Denies any prior psychiatric care until starting BuSpar  from her PCP recently. Had been diagnosed with depression in the past which improved as her personal life  Has tried Buspar  5 mg TID, Ambien , Zoloft , and possibly fluoxetine (head hurt, joint aches)  Started propranolol  on 06/19/2022  Substance use uses alcohol once or twice a week. Denies any othe substance  Past Medical History:  Past Medical History:  Diagnosis Date   Allergy     Anemia    iron supp   Anxiety    COVID 05/2020   Depression    Fibroids    GERD (gastroesophageal reflux disease)    omeprazole    Headache(784.0)    Hypertension 2012   Maxide   S/P LEEP 1992   Urticaria     Past Surgical History:  Procedure Laterality Date   ABDOMINAL HYSTERECTOMY  2014   Inicision and drainage of thrombosed hemorrhoid  04/06/2018   LEEP  1998   Abnormal pap   SUPRACERVICAL ABDOMINAL HYSTERECTOMY  01/18/2012   Procedure: HYSTERECTOMY  SUPRACERVICAL ABDOMINAL with LSO;  Surgeon: Gloris DELENA Hugger, MD;  Location: [facility];  Service: Gynecology;  Laterality: N/A;   TUBAL LIGATION  1997   WISDOM TOOTH EXTRACTION      Family History:  Family History  Problem Relation Age of Onset   Hypertension Mother    Arthritis Mother    Pulmonary fibrosis Mother    Colon polyps Father    Hypertension Father    Depression Father    Diabetes Father    Alcohol abuse Father    Colonic polyp Father    Diabetes Sister     Congestive Heart Failure Sister    Anxiety disorder Sister    Hypertension Sister    Arthritis Sister    Diabetes Brother    Breast cancer Paternal Grandmother    Heart attack Paternal Grandmother    Breast cancer Daughter 60   Cancer Daughter    Breast cancer Cousin        1st cousin, paternal   Allergic rhinitis Neg Hx    Angioedema Neg Hx    Asthma Neg Hx    Eczema Neg Hx    Immunodeficiency Neg Hx    Urticaria Neg Hx    Colon cancer Neg Hx    Esophageal cancer Neg Hx    Rectal cancer Neg Hx    Stomach cancer Neg Hx     Social History:  Social History   Socioeconomic History   Marital status: Married    Spouse name: Not on file   Number of children: 2   Years of education: 14   Highest education level: Not on file  Occupational History   Occupation: MEDICAL ASSISTANT    Employer:  [employer]  Tobacco Use   Smoking status: Never   Smokeless tobacco: Never  Vaping Use   Vaping status: Never Used  Substance and Sexual Activity   Alcohol use: Yes    Alcohol/week: 4.0 standard drinks of alcohol    Types: 3 Glasses of wine, 1 Standard drinks or equivalent per week    Comment: socially   Drug use: No   Sexual activity: Yes    Birth control/protection: Surgical    Comment: Hysterectomy  Other Topics Concern   Not on file  Social History Narrative   Regular exercise-no   Caffeine  Use-yes   Social Drivers of Health   Financial Resource Strain: Low Risk  (09/01/2023)   Overall Financial Resource Strain (CARDIA)    Difficulty of Paying Living Expenses: Not hard at all  Food Insecurity: Not on file  Transportation Needs: Not on file  Physical Activity: Insufficiently Active (09/01/2023)   Exercise Vital Sign    Days of Exercise per Week: 2 days    Minutes of Exercise per Session: 30 min  Stress: No Stress Concern Present (09/01/2023)   Abigail [name] of Occupational Health - Occupational Stress Questionnaire    Feeling of Stress : Not at all  Social  Connections: Moderately Integrated (09/01/2023)   Social Connection and Isolation Panel    Frequency of Communication with Friends and Family: Twice a week    Frequency of Social Gatherings with Friends and Family: Once a week    Attends Religious Services: More than 4 times per year    Active Member of [employer] or Organizations: No    Attends Banker Meetings: Never    Marital Status: Married    Allergies:  Allergies  Allergen Reactions   Shellfish Allergy  Itching and Swelling    Swollen  tongue, itching   Tylox [Oxycodone -Acetaminophen ] Itching   Tyloxapol Other (See Comments)    Current Medications: Current Outpatient Medications  Medication Sig Dispense Refill   amLODipine  (NORVASC ) 10 MG tablet TAKE 1 TABLET(10 MG) BY MOUTH DAILY 90 tablet 1   atorvastatin  (LIPITOR) 20 MG tablet Take 1 tablet (20 mg total) by mouth daily. 90 tablet 3   clotrimazole -betamethasone  (LOTRISONE ) cream Apply 1 Application topically 2 (two) times daily. 30 g 0   diphenhydrAMINE  (BENADRYL ) 25 MG tablet Take 25 mg by mouth as needed.     EPINEPHrine  0.3 mg/0.3 mL IJ SOAJ injection Inject 0.3 mg into the muscle as needed for anaphylaxis. 2 each 1   estradiol  (VIVELLE -DOT) 0.075 MG/24HR Place 1 patch onto the skin 2 (two) times a week. 24 patch 3   fluticasone  (FLONASE ) 50 MCG/ACT nasal spray SHAKE LIQUID AND USE 1 SPRAY IN EACH NOSTRIL DAILY 16 g 5   loratadine (CLARITIN) 10 MG tablet Take 10 mg by mouth daily.     losartan  (COZAAR ) 25 MG tablet TAKE 1 TABLET(25 MG) BY MOUTH DAILY 90 tablet 3   meloxicam  (MOBIC ) 15 MG tablet Take 15 mg by mouth daily as needed.     omeprazole  (PRILOSEC ) 20 MG capsule TAKE 1 CAPSULE(20 MG) BY MOUTH DAILY 90 capsule 1   propranolol  (INDERAL ) 10 MG tablet Take 1 tablet (10 mg total) by mouth 3 (three) times daily as needed. 270 tablet 1   SUMAtriptan  (IMITREX ) 25 MG tablet Take 1-2 tablets (25-50 mg total) by mouth every 2 (two) hours as needed for migraine. May  repeat in 2 hours if headache persists or recurs. No more than 100mg  in 24hrs 10 tablet 0   triamcinolone  ointment (KENALOG ) 0.1 % Apply topically twice daily to BODY as needed for red, sandpaper like rash.  Do not use on face, groin or armpits. 80 g 1   Vitamin D , Ergocalciferol , 2000 units CAPS Take 1 capsule by mouth daily after breakfast. 90 capsule 1   No current facility-administered medications for this visit.     Psychiatric Specialty Exam: Review of Systems  Last menstrual period 01/18/2012.There is no height or weight on file to calculate BMI.  General Appearance: Well Groomed  Eye Contact:  Good  Speech:  Clear and Coherent and Normal Rate  Volume:  Normal  Mood:  Euthymic  Affect:  Appropriate  Thought Process:  Coherent, Goal Directed, and Linear  Orientation:  Full (Time, Place, and Person)  Thought Content: Logical   Suicidal Thoughts:  No  Homicidal Thoughts:  No  Memory:  Immediate;   Good  Judgement:  Good  Insight:  Fair  Psychomotor Activity:  Normal  Concentration:  Concentration: Good  Recall:  Good  Fund of Knowledge: Good  Language: Good  Akathisia:  NA    AIMS (if indicated): not done  Assets:  Communication Skills Desire for Improvement Financial Resources/Insurance Housing Resilience Social Support Talents/Skills Transportation Vocational/Educational  ADL's:  Intact  Cognition: WNL  Sleep:  Good   Metabolic Disorder Labs: Lab Results  Component Value Date   HGBA1C 6.1 07/27/2023   MPG 114 07/28/2016   No results found for: PROLACTIN Lab Results  Component Value Date   CHOL 219 (H) 07/27/2023   TRIG 82.0 07/27/2023   HDL 60.00 07/27/2023   CHOLHDL 4 07/27/2023   VLDL 16.4 07/27/2023   LDLCALC 143 (H) 07/27/2023   LDLCALC 152 (H) 11/06/2022   Lab Results  Component Value Date   TSH 0.301 (L)  10/21/2023   TSH 0.72 06/16/2022    Therapeutic Level Labs: No results found for: LITHIUM No results found for: VALPROATE No  results found for: CBMZ   Screenings: GAD-7    Flowsheet Row Office Visit from 09/01/2023 in [facility] [place] [facility] Visit from 06/19/2022 in [facility] Office Visit from 08[facility]18/2021 in [facility] [place] [facility] Visit from 12/20/2020 in [facility] [place] [facility] Visit from 07/27/2017 in [facility] [place] [facility] [employer]  Total GAD-7 Score 0 0 0 4 4   PHQ2-9    Flowsheet Row Office Visit from 09/01/2023 in [facility] [place] [facility] Visit from 06/10/2023 in [facility] [place] [facility] Visit from 04/01/2023 in [facility] [place] [facility] [employer] Visit from 03/03/2023 in [facility] [place] [facility] [employer] [facility] Visit from 11/06/2022 in [facility] [place] [facility] [employer]  PHQ-2 Total Score 0 0 0 0 0    Collaboration of Care: Collaboration of Care: Medication Management AEB medication prescription and Other provider involved in patient's care AEB PCP, OB, allergist, and gastro chart review  Patient/Guardian was advised Release of Information must be obtained prior to any record release in order to collaborate their care with an outside provider. Patient/Guardian was advised if they have not already done so to contact the registration department to sign all necessary forms in order for us  to release information regarding their care.   Consent: Patient/Guardian gives verbal consent for treatment and assignment of benefits for services provided during this visit. Patient/Guardian expressed understanding and agreed to proceed.    Arvella CHRISTELLA Finder, MD 11/23/2023, 11:00 AM   Virtual Visit via Video Note  I connected with Rolena ill on 11/23/23 at 10:30 AM EDT by a video enabled telemedicine application and verified that I am  speaking with the correct person using two identifiers.  Location: Patient: Home Provider: Home Office   I discussed the limitations of evaluation and management by telemedicine and the availability of in person appointments. The patient expressed understanding and agreed to proceed.   I discussed the assessment and treatment plan with the patient. The patient was provided an opportunity to ask questions and all were answered. The patient agreed with the plan and demonstrated an understanding of the instructions.   The patient was advised to call back or seek an in-person evaluation if the symptoms worsen or if the condition fails to improve as anticipated.  I provided 15 minutes of non-face-to-face time during this encounter.   Arvella CHRISTELLA Finder, MD

## 2023-12-23 ENCOUNTER — Telehealth (HOSPITAL_COMMUNITY): Payer: Self-pay | Admitting: Psychiatry

## 2024-01-27 ENCOUNTER — Encounter: Payer: Self-pay | Admitting: Nurse Practitioner

## 2024-01-27 ENCOUNTER — Ambulatory Visit: Admitting: Nurse Practitioner

## 2024-01-27 VITALS — BP 116/70 | HR 62 | Temp 98.3°F | Ht 65.0 in | Wt 186.6 lb

## 2024-01-27 DIAGNOSIS — E1169 Type 2 diabetes mellitus with other specified complication: Secondary | ICD-10-CM | POA: Diagnosis not present

## 2024-01-27 DIAGNOSIS — E1165 Type 2 diabetes mellitus with hyperglycemia: Secondary | ICD-10-CM | POA: Diagnosis not present

## 2024-01-27 DIAGNOSIS — E785 Hyperlipidemia, unspecified: Secondary | ICD-10-CM

## 2024-01-27 DIAGNOSIS — E559 Vitamin D deficiency, unspecified: Secondary | ICD-10-CM

## 2024-01-27 DIAGNOSIS — I10 Essential (primary) hypertension: Secondary | ICD-10-CM

## 2024-01-27 NOTE — Patient Instructions (Signed)
 Schedule fasting lab appt. Need to be fasting 8hrs prior to blood draw. Ok to drink water and take BP meds. Maintain Heart healthy diet and daily exercise. Maintain current medications.

## 2024-01-27 NOTE — Assessment & Plan Note (Signed)
No adverse effects with atorvastatin Repeat lipid panel 

## 2024-01-27 NOTE — Progress Notes (Signed)
 Established Patient Visit  Patient: Abigail [name]   DOB: 29-Oct-1970   53 y.o. Female  MRN: [id number] Visit Date: 01/27/2024  Subjective:    Chief Complaint  Patient presents with   Follow-up    6 month follow up for DM, HTN and Hyperlipidemia  Due for A1c, Foot exam, and Zoster vaccine    HPI Vitamin D  insufficiency Repeat vit D  DM (diabetes mellitus) (HCC) Repeat hgbA1c, BMP and lipid panel today BP at goal No statin Use of ARB Advised to schedule annual eye exam No medication at this time   Hyperlipidemia associated with type 2 diabetes mellitus (HCC) No adverse effects with atorvastatin  Repeat lipid panel  HTN (hypertension) BP at goal with amlodipine , losartan , and propanolol BP Readings from Last 3 Encounters:  01/27/24 116/70  10/21/23 126/78  09/01/23 128/86    Maintain med doses Repeat BMP F/up in 6months  Reviewed medical, surgical, and social history today  Medications: Outpatient Medications Prior to Visit  Medication Sig   amLODipine  (NORVASC ) 10 MG tablet TAKE 1 TABLET(10 MG) BY MOUTH DAILY   atorvastatin  (LIPITOR) 20 MG tablet Take 1 tablet (20 mg total) by mouth daily.   clobetasol ointment (TEMOVATE) 0.05 % Apply 1 Application topically in the morning and at bedtime.   diphenhydrAMINE  (BENADRYL ) 25 MG tablet Take 25 mg by mouth as needed.   EPINEPHrine  0.3 mg/0.3 mL IJ SOAJ injection Inject 0.3 mg into the muscle as needed for anaphylaxis.   estradiol  (VIVELLE -DOT) 0.075 MG/24HR Place 1 patch onto the skin 2 (two) times a week.   fluticasone  (FLONASE ) 50 MCG/ACT nasal spray SHAKE LIQUID AND USE 1 SPRAY IN EACH NOSTRIL DAILY   loratadine (CLARITIN) 10 MG tablet Take 10 mg by mouth daily.   losartan  (COZAAR ) 25 MG tablet TAKE 1 TABLET(25 MG) BY MOUTH DAILY   meloxicam  (MOBIC ) 15 MG tablet Take 15 mg by mouth daily as needed.   omeprazole  (PRILOSEC ) 20 MG capsule TAKE 1 CAPSULE(20 MG) BY MOUTH DAILY   propranolol  (INDERAL ) 10 MG  tablet Take 1 tablet (10 mg total) by mouth 3 (three) times daily as needed.   SUMAtriptan  (IMITREX ) 25 MG tablet Take 1-2 tablets (25-50 mg total) by mouth every 2 (two) hours as needed for migraine. May repeat in 2 hours if headache persists or recurs. No more than 100mg  in 24hrs   Vitamin D , Ergocalciferol , 2000 units CAPS Take 1 capsule by mouth daily after breakfast.   [DISCONTINUED] clotrimazole -betamethasone  (LOTRISONE ) cream Apply 1 Application topically 2 (two) times daily. (Patient not taking: Reported on 01/27/2024)   [DISCONTINUED] triamcinolone  ointment (KENALOG ) 0.1 % Apply topically twice daily to BODY as needed for red, sandpaper like rash.  Do not use on face, groin or armpits. (Patient not taking: Reported on 01/27/2024)   No facility-administered medications prior to visit.   Reviewed past medical and social history.   ROS per HPI above      Objective:  BP 116/70 (BP Location: Left Arm, Patient Position: Sitting, Cuff Size: Large)   Pulse 62   Temp 98.3 F (36.8 C) (Oral)   Ht 5' 5 (1.651 m)   Wt 186 lb 9.6 oz (84.6 kg)   LMP 01/18/2012 (Exact Date)   SpO2 98%   BMI 31.05 kg/m      Physical Exam Vitals and nursing note reviewed.  Cardiovascular:     Rate and Rhythm: Normal rate and regular rhythm.  Pulses: Normal pulses.     Heart sounds: Normal heart sounds.  Pulmonary:     Effort: Pulmonary effort is normal.     Breath sounds: Normal breath sounds.  Musculoskeletal:     Right lower leg: No edema.     Left lower leg: No edema.  Neurological:     Mental Status: She is alert and oriented to person, place, and time.     No results found for any visits on 01/27/24.    Assessment & Plan:    Problem List Items Addressed This Visit     DM (diabetes mellitus) (HCC) - Primary   Repeat hgbA1c, BMP and lipid panel today BP at goal No statin Use of ARB Advised to schedule annual eye exam No medication at this time       Relevant Orders    Hemoglobin A1c   Renal Function Panel   HTN (hypertension)   BP at goal with amlodipine , losartan , and propanolol BP Readings from Last 3 Encounters:  01/27/24 116/70  10/21/23 126/78  09/01/23 128/86    Maintain med doses Repeat BMP F/up in 6months      Relevant Orders   Renal Function Panel   Hyperlipidemia associated with type 2 diabetes mellitus (HCC)   No adverse effects with atorvastatin  Repeat lipid panel      Relevant Orders   Lipid panel   Vitamin D  insufficiency   Repeat vit D      Relevant Orders   VITAMIN D  25 Hydroxy (Vit-D Deficiency, Fractures)   Return in about 6 months (around 07/26/2024) for HTN, DM, hyperlipidemia (fasting).     Roselie Mood, NP

## 2024-01-27 NOTE — Assessment & Plan Note (Signed)
 Repeat hgbA1c, BMP and lipid panel today BP at goal No statin Use of ARB Advised to schedule annual eye exam No medication at this time

## 2024-01-27 NOTE — Assessment & Plan Note (Signed)
 BP at goal with amlodipine , losartan , and propanolol BP Readings from Last 3 Encounters:  01/27/24 116/70  10/21/23 126/78  09/01/23 128/86    Maintain med doses Repeat BMP F/up in 6months

## 2024-01-27 NOTE — Assessment & Plan Note (Signed)
 Repeat vit D

## 2024-01-31 ENCOUNTER — Other Ambulatory Visit

## 2024-02-04 ENCOUNTER — Other Ambulatory Visit (INDEPENDENT_AMBULATORY_CARE_PROVIDER_SITE_OTHER)

## 2024-02-04 ENCOUNTER — Ambulatory Visit: Payer: Self-pay | Admitting: Nurse Practitioner

## 2024-02-04 DIAGNOSIS — I10 Essential (primary) hypertension: Secondary | ICD-10-CM | POA: Diagnosis not present

## 2024-02-04 DIAGNOSIS — E785 Hyperlipidemia, unspecified: Secondary | ICD-10-CM

## 2024-02-04 DIAGNOSIS — E1165 Type 2 diabetes mellitus with hyperglycemia: Secondary | ICD-10-CM

## 2024-02-04 DIAGNOSIS — E1169 Type 2 diabetes mellitus with other specified complication: Secondary | ICD-10-CM

## 2024-02-04 DIAGNOSIS — E559 Vitamin D deficiency, unspecified: Secondary | ICD-10-CM

## 2024-02-04 LAB — LIPID PANEL
Cholesterol: 152 mg/dL (ref 0–200)
HDL: 58 mg/dL (ref >39.00)
LDL Cholesterol: 80 mg/dL (ref 0–99)
NonHDL: 93.57
Total CHOL/HDL Ratio: 3
Triglycerides: 67 mg/dL (ref 0.0–149.0)
VLDL: 13.4 mg/dL (ref 0.0–40.0)

## 2024-02-04 LAB — RENAL FUNCTION PANEL
Albumin: 4.3 g/dL (ref 3.5–5.2)
BUN: 12 mg/dL (ref 6–23)
CO2: 31 meq/L (ref 19–32)
Calcium: 9.4 mg/dL (ref 8.4–10.5)
Chloride: 103 meq/L (ref 96–112)
Creatinine, Ser: 0.87 mg/dL (ref 0.40–1.20)
GFR: 75.58 mL/min (ref >60.00)
Glucose, Bld: 89 mg/dL (ref 70–99)
Phosphorus: 3.6 mg/dL (ref 2.3–4.6)
Potassium: 3.9 meq/L (ref 3.5–5.1)
Sodium: 140 meq/L (ref 135–145)

## 2024-02-04 LAB — VITAMIN D 25 HYDROXY (VIT D DEFICIENCY, FRACTURES): VITD: 37.13 ng/mL (ref 30.00–100.00)

## 2024-02-04 LAB — HEMOGLOBIN A1C: Hgb A1c MFr Bld: 6.4 % (ref 4.6–6.5)

## 2024-02-07 MED ORDER — AMLODIPINE BESYLATE 10 MG PO TABS: 10.0000 mg | ORAL_TABLET | Freq: Every evening | ORAL | 3 refills | Status: AC

## 2024-02-07 MED ORDER — LOSARTAN POTASSIUM 25 MG PO TABS: ORAL_TABLET | ORAL | 3 refills | Status: DC

## 2024-03-09 ENCOUNTER — Ambulatory Visit: Admitting: Nurse Practitioner

## 2024-03-09 ENCOUNTER — Encounter: Payer: Self-pay | Admitting: Nurse Practitioner

## 2024-03-09 VITALS — BP 132/79 | HR 72 | Temp 96.1°F | Ht 65.0 in | Wt 189.6 lb

## 2024-03-09 DIAGNOSIS — L729 Follicular cyst of the skin and subcutaneous tissue, unspecified: Secondary | ICD-10-CM | POA: Diagnosis not present

## 2024-03-09 NOTE — Progress Notes (Signed)
 Acute Office Visit  Subjective:    Patient ID: Abigail [name], female    DOB: [birth year], 53 y.o.   MRN: [id number]  Chief Complaint  Patient presents with   Acute Visit    Pt presents today w a knot on her wrist. Pt states she doesn't know how long its been on her wrist she noticed it a month and half ago because it grew in size. Doesn't cause any pain, no redness around knot   HPI Cyst of subcutaneous tissue Present for several years, noticed gradual increase in size, no pain, no erythema, no joint effusion, no limited ROM.  Advised to monitor at this time   Outpatient Medications Prior to Visit  Medication Sig   amLODipine  (NORVASC ) 10 MG tablet Take 1 tablet (10 mg total) by mouth every evening.   atorvastatin  (LIPITOR) 20 MG tablet Take 1 tablet (20 mg total) by mouth daily.   clobetasol ointment (TEMOVATE) 0.05 % Apply 1 Application topically in the morning and at bedtime.   diphenhydrAMINE  (BENADRYL ) 25 MG tablet Take 25 mg by mouth as needed.   EPINEPHrine  0.3 mg/0.3 mL IJ SOAJ injection Inject 0.3 mg into the muscle as needed for anaphylaxis.   estradiol  (VIVELLE -DOT) 0.075 MG/24HR Place 1 patch onto the skin 2 (two) times a week.   fluticasone  (FLONASE ) 50 MCG/ACT nasal spray SHAKE LIQUID AND USE 1 SPRAY IN EACH NOSTRIL DAILY   loratadine (CLARITIN) 10 MG tablet Take 10 mg by mouth daily.   losartan  (COZAAR ) 25 MG tablet TAKE 1 TABLET(25 MG) BY MOUTH DAILY   meloxicam  (MOBIC ) 15 MG tablet Take 15 mg by mouth daily as needed.   omeprazole  (PRILOSEC ) 20 MG capsule TAKE 1 CAPSULE(20 MG) BY MOUTH DAILY   propranolol  (INDERAL ) 10 MG tablet Take 1 tablet (10 mg total) by mouth 3 (three) times daily as needed.   SUMAtriptan  (IMITREX ) 25 MG tablet Take 1-2 tablets (25-50 mg total) by mouth every 2 (two) hours as needed for migraine. May repeat in 2 hours if headache persists or recurs. No more than 100mg  in 24hrs   Vitamin D , Ergocalciferol , 2000 units CAPS Take 1 capsule by mouth  daily after breakfast. (Patient not taking: Reported on 03/09/2024)   No facility-administered medications prior to visit.    Reviewed past medical and social history.  Review of Systems Per HPI     Objective:    Physical Exam Vitals and nursing note reviewed.  Musculoskeletal:     Right wrist: Normal.     Left wrist: Normal.       Hands:  Neurological:     Mental Status: She is oriented to person, place, and time.    BP 132/79   Pulse 72   Temp (!) 96.1 F (35.6 C)   Ht 5' 5 (1.651 m)   Wt 189 lb 9.6 oz (86 kg)   LMP 01/18/2012 (Exact Date)   SpO2 97%   BMI 31.55 kg/m    No results found for any visits on 03/09/24.     Assessment & Plan:   Problem List Items Addressed This Visit     Cyst of subcutaneous tissue - Primary   Present for several years, noticed gradual increase in size, no pain, no erythema, no joint effusion, no limited ROM.  Advised to monitor at this time      No orders of the defined types were placed in this encounter.  Return if symptoms worsen or fail to improve.    Roselie Mood,  NP

## 2024-03-09 NOTE — Assessment & Plan Note (Signed)
 Present for several years, noticed gradual increase in size, no pain, no erythema, no joint effusion, no limited ROM.  Advised to monitor at this time

## 2024-03-13 ENCOUNTER — Encounter (HOSPITAL_COMMUNITY): Payer: Self-pay | Admitting: *Deleted

## 2024-04-04 ENCOUNTER — Telehealth (HOSPITAL_COMMUNITY): Admitting: Psychiatry

## 2024-04-11 ENCOUNTER — Ambulatory Visit: Admitting: Nurse Practitioner

## 2024-04-11 ENCOUNTER — Encounter: Payer: Self-pay | Admitting: Nurse Practitioner

## 2024-04-11 VITALS — BP 140/82 | HR 74 | Temp 98.7°F | Ht 65.0 in | Wt 186.4 lb

## 2024-04-11 DIAGNOSIS — I10 Essential (primary) hypertension: Secondary | ICD-10-CM

## 2024-04-11 DIAGNOSIS — R7989 Other specified abnormal findings of blood chemistry: Secondary | ICD-10-CM

## 2024-04-11 DIAGNOSIS — R002 Palpitations: Secondary | ICD-10-CM

## 2024-04-11 LAB — CBC
HCT: 40.2 % (ref 36.0–46.0)
Hemoglobin: 13.3 g/dL (ref 12.0–15.0)
MCHC: 33.2 g/dL (ref 30.0–36.0)
MCV: 92.5 fl (ref 78.0–100.0)
Platelets: 260 K/uL (ref 150.0–400.0)
RBC: 4.35 Mil/uL (ref 3.87–5.11)
RDW: 13.6 % (ref 11.5–15.5)
WBC: 4.8 K/uL (ref 4.0–10.5)

## 2024-04-11 LAB — RENAL FUNCTION PANEL
Albumin: 4.5 g/dL (ref 3.5–5.2)
BUN: 17 mg/dL (ref 6–23)
CO2: 31 meq/L (ref 19–32)
Calcium: 9.4 mg/dL (ref 8.4–10.5)
Chloride: 102 meq/L (ref 96–112)
Creatinine, Ser: 0.78 mg/dL (ref 0.40–1.20)
GFR: 86.05 mL/min (ref >60.00)
Glucose, Bld: 81 mg/dL (ref 70–99)
Phosphorus: 2.7 mg/dL (ref 2.3–4.6)
Potassium: 3.9 meq/L (ref 3.5–5.1)
Sodium: 138 meq/L (ref 135–145)

## 2024-04-11 NOTE — Assessment & Plan Note (Signed)
 Elevated BP due to lack of propanolo dose BP Readings from Last 3 Encounters:  04/11/24 (!) 140/82  03/09/24 132/79  01/27/24 116/70    Advised to resume med F/up in 76month

## 2024-04-11 NOTE — Progress Notes (Signed)
 Established Patient Visit  Patient: Abigail [name]   DOB: Oct 22, 1970   53 y.o. Female  MRN: [id number] Visit Date: 04/11/2024  Subjective:    Chief Complaint  Patient presents with   Palpitations    Intermittently for 1-2 weeks and when bending feels light headed    Palpitations  This is a new problem. The current episode started in the past 7 days. The problem occurs constantly. The problem has been unchanged. The symptoms are aggravated by unknown. Associated symptoms include anxiety, dizziness and an irregular heartbeat. Pertinent negatives include no chest fullness, chest pain, coughing, diaphoresis, fever, malaise/fatigue, nausea, near-syncope, numbness, shortness of breath, syncope, vomiting or weakness. Risk factors include obesity. Her past medical history is significant for anxiety.  Stopped taking propanolol Previously took propanolol 2x/day, med discontinued 1week ago due to lack of med refill Coffee daily 10-12oz, and ALCOHOL use: 3glasses of wine, drink week. No soda, No tea, no illicit drug use, no tobacco use  Reviewed medical, surgical, and social history today  Medications: Outpatient Medications Prior to Visit  Medication Sig   amLODipine  (NORVASC ) 10 MG tablet Take 1 tablet (10 mg total) by mouth every evening.   atorvastatin  (LIPITOR) 20 MG tablet Take 1 tablet (20 mg total) by mouth daily.   clobetasol ointment (TEMOVATE) 0.05 % Apply 1 Application topically in the morning and at bedtime.   diphenhydrAMINE  (BENADRYL ) 25 MG tablet Take 25 mg by mouth as needed.   EPINEPHrine  0.3 mg/0.3 mL IJ SOAJ injection Inject 0.3 mg into the muscle as needed for anaphylaxis.   estradiol  (VIVELLE -DOT) 0.075 MG/24HR Place 1 patch onto the skin 2 (two) times a week.   fluticasone  (FLONASE ) 50 MCG/ACT nasal spray SHAKE LIQUID AND USE 1 SPRAY IN EACH NOSTRIL DAILY   loratadine (CLARITIN) 10 MG tablet Take 10 mg by mouth daily.   losartan  (COZAAR ) 25 MG tablet  TAKE 1 TABLET(25 MG) BY MOUTH DAILY   meloxicam  (MOBIC ) 15 MG tablet Take 15 mg by mouth daily as needed.   omeprazole  (PRILOSEC ) 20 MG capsule TAKE 1 CAPSULE(20 MG) BY MOUTH DAILY   propranolol  (INDERAL ) 10 MG tablet Take 1 tablet (10 mg total) by mouth 3 (three) times daily as needed.   SUMAtriptan  (IMITREX ) 25 MG tablet Take 1-2 tablets (25-50 mg total) by mouth every 2 (two) hours as needed for migraine. May repeat in 2 hours if headache persists or recurs. No more than 100mg  in 24hrs   Vitamin D , Ergocalciferol , 2000 units CAPS Take 1 capsule by mouth daily after breakfast. (Patient not taking: Reported on 03/09/2024)   No facility-administered medications prior to visit.   Reviewed past medical and social history.   ROS per HPI above       Objective:  BP (!) 140/82 (BP Location: Left Arm, Patient Position: Sitting, Cuff Size: Large)   Pulse 74   Temp 98.7 F (37.1 C) (Oral)   Ht 5' 5 (1.651 m)   Wt 186 lb 6.4 oz (84.6 kg)   LMP 01/18/2012 (Exact Date)   SpO2 96%   BMI 31.02 kg/m      Physical Exam Vitals and nursing note reviewed.  Constitutional:      General: She is not in acute distress. Cardiovascular:     Rate and Rhythm: Normal rate and regular rhythm.     Pulses: Normal pulses.     Heart sounds: Normal heart sounds.  Pulmonary:  Effort: Pulmonary effort is normal.     Breath sounds: Normal breath sounds.  Musculoskeletal:     Right lower leg: No edema.     Left lower leg: No edema.  Neurological:     Mental Status: She is alert.     No results found for any visits on 04/11/24.    Assessment & Plan:    Problem List Items Addressed This Visit     HTN (hypertension)   Elevated BP due to lack of propanolo dose BP Readings from Last 3 Encounters:  04/11/24 (!) 140/82  03/09/24 132/79  01/27/24 116/70    Advised to resume med F/up in 75month      Other Visit Diagnoses       Palpitations    -  Primary   Relevant Orders   EKG 12-Lead    Thyroid  Panel With TSH   CBC   Renal Function Panel     Low TSH level       Relevant Orders   Thyroid  Panel With TSH      ECG indicates NSR with no compared to 2019 ECG. 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 Propanolol was last filled in 11/2023 #270tabs. She has 1refill left. I advised Ms. Wood to resume propanolol, stop caffeine  and alcohol consumption.  Return in about 4 weeks (around 05/09/2024).     Roselie Mood, NP

## 2024-04-11 NOTE — Patient Instructions (Addendum)
 Call your pharmacy for propanolol refill Normal ECG Stop caffeine  and alcohol. Resume propanolol Schedule DIABETIC eye exam Go to lab

## 2024-04-12 ENCOUNTER — Ambulatory Visit: Payer: Self-pay | Admitting: Nurse Practitioner

## 2024-04-13 LAB — THYROID PANEL WITH TSH
Free Thyroxine Index: 1.8 (ref 1.4–3.8)
T3 Uptake: 28 % (ref 22–35)
T4, Total: 6.6 ug/dL (ref 5.1–11.9)
TSH: 0.42 m[IU]/L

## 2024-04-17 NOTE — Progress Notes (Deleted)
 BH MD/PA/NP OP Progress Note  04/17/2024 9:59 AM Abigail [name]  MRN:  [id number]  Visit Diagnosis:  No diagnosis found.  Assessment: Abigail [name] is a 53 y.o. female with a history of GAD, Vitamin D  deficiency  who presented to [facility] at [facility] for initial evaluation on 06/19/2022.  During initial evaluation patient reported experiencing symptoms of adrenaline rush, racing heart, flushed feeling, occasional blood pressure elevations, and feelings of antsiness that have been occurring for around a year now.  Symptoms occur secondary to stressful, emotional, or exciting situations and can lead to some minor anxiety that there is something wrong with her.  Episodes resolve after she is able to breathe for a few minutes.  Of note patient has a past history of panic attacks which presented with palpitations, fatigue, hypertension, and fear of something awful happening.  She describes these current episodes as different from her prior panic attacks.  Patient denied any depressed mood, anxiety, psychosis, paranoia, delusions, or history of mania.  She did endorse a past history of trauma though denies symptoms or symptoms consistent with PTSD.  Patient met criteria for panic disorder.  Abigail [name] presents for follow-up evaluation. Today, 04/17/24, patient   that anxiety symptoms have been stable over the past year.  She continues to take the propranolol  without adverse effect.  Patient will typically use the propranolol  2 times a day during the week and 3 times a day during the weekend.  We will continue current regimen and follow up in 9 months.  Plan: - Continue Propranolol  10 mg TID prn anxiety  - Can consider therapy in the future - Crisis resources reviewed - Follow up in 9 months   Chief Complaint:  No chief complaint on file.  HPI: Abigail [name] presents reporting   past year. She reports that everything has been going fairly well for her over this year. The  anxiety and panic have been better controlled with the propranolol . Recently she has had a slight increase in anxiety but that was related to trying to conserve the propranolol . She would only take it once a day at that time.  Prior to that she would typically take it twice a day during the week and 3 times a day during the weekend.  Patient denies any notable adverse side effects from the propranolol .  Past Psychiatric History: Denies any prior psychiatric care until starting BuSpar  from her PCP recently. Had been diagnosed with depression in the past which improved as her personal life  Has tried Buspar  5 mg TID, Ambien , Zoloft , and possibly fluoxetine (head hurt, joint aches)  Started propranolol  on 06/19/2022  Substance use uses alcohol once or twice a week. Denies any othe substance  Past Medical History:  Past Medical History:  Diagnosis Date   Allergy     Anemia    iron supp   Anxiety    COVID 05/2020   Depression    Fibroids    GERD (gastroesophageal reflux disease)    omeprazole    Headache(784.0)    Hypertension 2012   Maxide   S/P LEEP 1992   Urticaria     Past Surgical History:  Procedure Laterality Date   ABDOMINAL HYSTERECTOMY  2014   Inicision and drainage of thrombosed hemorrhoid  04/06/2018   LEEP  1998   Abnormal pap   SUPRACERVICAL ABDOMINAL HYSTERECTOMY  01/18/2012   Procedure: HYSTERECTOMY SUPRACERVICAL ABDOMINAL with LSO;  Surgeon: Gloris DELENA Hugger, MD;  Location: [facility];  Service: Gynecology;  Laterality: N/A;  TUBAL LIGATION  1997   WISDOM TOOTH EXTRACTION      Family History:  Family History  Problem Relation Age of Onset   Hypertension Mother    Arthritis Mother    Pulmonary fibrosis Mother    Colon polyps Father    Hypertension Father    Depression Father    Diabetes Father    Alcohol abuse Father    Colonic polyp Father    Diabetes Sister    Congestive Heart Failure Sister    Anxiety disorder Sister    Hearing loss Sister     Hypertension Sister    Arthritis Sister    Diabetes Brother    Breast cancer Paternal Grandmother    Heart attack Paternal Grandmother    Breast cancer Daughter 39   Cancer Daughter    Breast cancer Cousin        1st cousin, paternal   Allergic rhinitis Neg Hx    Angioedema Neg Hx    Asthma Neg Hx    Eczema Neg Hx    Immunodeficiency Neg Hx    Urticaria Neg Hx    Colon cancer Neg Hx    Esophageal cancer Neg Hx    Rectal cancer Neg Hx    Stomach cancer Neg Hx     Social History:  Social History   Socioeconomic History   Marital status: Married    Spouse name: Not on file   Number of children: 2   Years of education: 14   Highest education level: Not on file  Occupational History   Occupation: MEDICAL ASSISTANT    Employer: [employer]  Tobacco Use   Smoking status: Never   Smokeless tobacco: Never  Vaping Use   Vaping status: Never Used  Substance and Sexual Activity   Alcohol use: Yes    Alcohol/week: 4.0 standard drinks of alcohol    Types: 3 Glasses of wine, 1 Standard drinks or equivalent per week    Comment: socially   Drug use: No   Sexual activity: Yes    Birth control/protection: Surgical    Comment: Hysterectomy  Other Topics Concern   Not on file  Social History Narrative   Regular exercise-no   Caffeine  Use-yes   Social Drivers of Health   Financial Resource Strain: Low Risk  (09/01/2023)   Overall Financial Resource Strain (CARDIA)    Difficulty of Paying Living Expenses: Not hard at all  Food Insecurity: Not on file  Transportation Needs: Not on file  Physical Activity: Insufficiently Active (09/01/2023)   Exercise Vital Sign    Days of Exercise per Week: 2 days    Minutes of Exercise per Session: 30 min  Stress: No Stress Concern Present (09/01/2023)   [employer] of Occupational Health - Occupational Stress Questionnaire    Feeling of Stress : Not at all  Social Connections: Moderately Integrated (09/01/2023)   Social  Connection and Isolation Panel    Frequency of Communication with Friends and Family: Twice a week    Frequency of Social Gatherings with Friends and Family: Once a week    Attends Religious Services: More than 4 times per year    Active Member of [employer] or Organizations: No    Attends Banker Meetings: Never    Marital Status: Married    Allergies:  Allergies  Allergen Reactions   Shellfish Allergy  Itching and Swelling    Swollen tongue, itching   Tylox [Oxycodone -Acetaminophen ] Itching   Tyloxapol Other (See Comments)  Current Medications: Current Outpatient Medications  Medication Sig Dispense Refill   amLODipine  (NORVASC ) 10 MG tablet Take 1 tablet (10 mg total) by mouth every evening. 90 tablet 3   atorvastatin  (LIPITOR) 20 MG tablet Take 1 tablet (20 mg total) by mouth daily. 90 tablet 3   clobetasol ointment (TEMOVATE) 0.05 % Apply 1 Application topically in the morning and at bedtime.     diphenhydrAMINE  (BENADRYL ) 25 MG tablet Take 25 mg by mouth as needed.     EPINEPHrine  0.3 mg/0.3 mL IJ SOAJ injection Inject 0.3 mg into the muscle as needed for anaphylaxis. 2 each 1   estradiol  (VIVELLE -DOT) 0.075 MG/24HR Place 1 patch onto the skin 2 (two) times a week. 24 patch 3   fluticasone  (FLONASE ) 50 MCG/ACT nasal spray SHAKE LIQUID AND USE 1 SPRAY IN EACH NOSTRIL DAILY 16 g 5   loratadine (CLARITIN) 10 MG tablet Take 10 mg by mouth daily.     losartan  (COZAAR ) 25 MG tablet TAKE 1 TABLET(25 MG) BY MOUTH DAILY 90 tablet 3   meloxicam  (MOBIC ) 15 MG tablet Take 15 mg by mouth daily as needed.     omeprazole  (PRILOSEC ) 20 MG capsule TAKE 1 CAPSULE(20 MG) BY MOUTH DAILY 90 capsule 1   propranolol  (INDERAL ) 10 MG tablet Take 1 tablet (10 mg total) by mouth 3 (three) times daily as needed. 270 tablet 1   SUMAtriptan  (IMITREX ) 25 MG tablet Take 1-2 tablets (25-50 mg total) by mouth every 2 (two) hours as needed for migraine. May repeat in 2 hours if headache persists or  recurs. No more than 100mg  in 24hrs 10 tablet 0   Vitamin D , Ergocalciferol , 2000 units CAPS Take 1 capsule by mouth daily after breakfast. (Patient not taking: Reported on 03/09/2024) 90 capsule 1   No current facility-administered medications for this visit.     Psychiatric Specialty Exam: Review of Systems  Last menstrual period 01/18/2012.There is no height or weight on file to calculate BMI.  General Appearance: Well Groomed  Eye Contact:  Good  Speech:  Clear and Coherent and Normal Rate  Volume:  Normal  Mood:  Euthymic  Affect:  Appropriate  Thought Process:  Coherent, Goal Directed, and Linear  Orientation:  Full (Time, Place, and Person)  Thought Content: Logical   Suicidal Thoughts:  No  Homicidal Thoughts:  No  Memory:  Immediate;   Good  Judgement:  Good  Insight:  Fair  Psychomotor Activity:  Normal  Concentration:  Concentration: Good  Recall:  Good  Fund of Knowledge: Good  Language: Good  Akathisia:  NA    AIMS (if indicated): not done  Assets:  Communication Skills Desire for Improvement Financial Resources/Insurance Housing Resilience Social Support Talents/Skills Transportation Vocational/Educational  ADL's:  Intact  Cognition: WNL  Sleep:  Good   Metabolic Disorder Labs: Lab Results  Component Value Date   HGBA1C 6.4 02/04/2024   MPG 114 07/28/2016   No results found for: PROLACTIN Lab Results  Component Value Date   CHOL 152 02/04/2024   TRIG 67.0 02/04/2024   HDL 58.00 02/04/2024   CHOLHDL 3 02/04/2024   VLDL 13.4 02/04/2024   LDLCALC 80 02/04/2024   LDLCALC 143 (H) 07/27/2023   Lab Results  Component Value Date   TSH 0.42 04/11/2024   TSH 0.301 (L) 10/21/2023    Therapeutic Level Labs: No results found for: LITHIUM No results found for: VALPROATE No results found for: CBMZ   Screenings: GAD-7    Flowsheet Row Office Visit  from 09/01/2023 in [facility] Visit from  06/19/2022 in [facility] Office Visit from 08/18/2021 in [facility] Visit from 12/20/2020 in [facility] Visit from 07/27/2017 in [facility] [employer]  Total GAD-7 Score 0 0 0 4 4   PHQ2-9    Flowsheet Row Office Visit from 03/09/2024 in [facility] [place] [facility] Visit from 09/01/2023 in [facility] Visit from 06/10/2023 in [facility] [place] [facility] [employer] [facility] Visit from 04/01/2023 in [facility] [employer] Visit from 03/03/2023 in [facility] [employer]  PHQ-2 Total Score 0 0 0 0 0    Collaboration of Care: Collaboration of Care: Medication Management AEB medication prescription and Other provider involved in patient's care AEB PCP chart review  Patient/Guardian was advised Release of Information must be obtained prior to any record release in order to collaborate their care with an outside provider. Patient/Guardian was advised if they have not already done so to contact the registration department to sign all necessary forms in order for us  to release information regarding their care.   Consent: Patient/Guardian gives verbal consent for treatment and assignment of benefits for services provided during this visit. Patient/Guardian expressed understanding and agreed to proceed.    Arvella CHRISTELLA Finder, MD 04/17/2024, 9:59 AM   Virtual Visit via Video Note  I connected with Abigail [name] on 04/17/24 at 11:00 AM EST by a video enabled telemedicine application and verified that I am speaking with the correct person using two identifiers.  Location: Patient: Home Provider: Home Office   I discussed the limitations of evaluation and management by telemedicine and the  availability of in person appointments. The patient expressed understanding and agreed to proceed.   I discussed the assessment and treatment plan with the patient. The patient was provided an opportunity to ask questions and all were answered. The patient agreed with the plan and demonstrated an understanding of the instructions.   The patient was advised to call back or seek an in-person evaluation if the symptoms worsen or if the condition fails to improve as anticipated.  I provided 15 minutes of non-face-to-face time during this encounter.   Arvella CHRISTELLA Finder, MD

## 2024-04-22 ENCOUNTER — Telehealth (HOSPITAL_COMMUNITY): Admitting: Psychiatry

## 2024-04-24 NOTE — Progress Notes (Unsigned)
 BH MD/PA/NP OP Progress Note  04/25/2024 3:50 PM Abigail [name]  MRN:  [id number]  Visit Diagnosis:    ICD-10-CM   1. Panic disorder  F41.0 propranolol  (INDERAL ) 10 MG tablet      Assessment: Abigail [name] is a 53 y.o. female with a history of GAD, Vitamin D  deficiency  who presented to [facility] at [facility] for initial evaluation on 06/19/2022.  During initial evaluation patient reported experiencing symptoms of adrenaline rush, racing heart, flushed feeling, occasional blood pressure elevations, and feelings of antsiness that have been occurring for around a year now.  Symptoms occur secondary to stressful, emotional, or exciting situations and can lead to some minor anxiety that there is something wrong with her.  Episodes resolve after she is able to breathe for a few minutes.  Of note patient has a past history of panic attacks which presented with palpitations, fatigue, hypertension, and fear of something awful happening.  She describes these current episodes as different from her prior panic attacks.  Patient denied any depressed mood, anxiety, psychosis, paranoia, delusions, or history of mania.  She did endorse a past history of trauma though denies symptoms or symptoms consistent with PTSD.  Patient met criteria for panic disorder.  Abigail [name] presents for follow-up evaluation. Today, 04/25/2024, patient has had stable anxiety with the exception of a 1 week stretch where she was off the propranolol .  During that time patient experienced palpitations, restlessness, and increased racing thoughts.  This was due to an issue picking up the refill at the pharmacy.  Upon restarting the propranolol  anxiety symptoms improved.  Patient denies any adverse medication side effects.  Will continue on her current regimen and follow-up in 6 months.  Plan: - Continue Propranolol  10 mg TID prn anxiety  - Can consider therapy in the future - Crisis resources reviewed - Follow  up in 6 months   Chief Complaint:  Chief Complaint  Patient presents with   Follow-up   HPI: Abigail [name] presents reporting that the past 5 months she seems to be doing fine.  Things have been more or less stable without any significant changes while she has been taking the medication.  Of note patient did run out of medication around 3 weeks ago as there was difficulty picking up the refill from the pharmacy.  Patient was off the propranolol  for 1 week before being able to pick up the refill.  During that 1 week stretch she did have increased anxiety worsening palpitations, restlessness, and increased worry.  After restarting propranolol  the symptoms subsided.  Patient denies any adverse effects from the medication.  She reports things at home and work have been going well.  She and her husband have moved into their first house which she is excited by and they are in the process of getting everything situated.  Past Psychiatric History: Denies any prior psychiatric care until starting BuSpar  from her PCP recently. Had been diagnosed with depression in the past which improved as her personal life  Has tried Buspar  5 mg TID, Ambien , Zoloft , and possibly fluoxetine (head hurt, joint aches)  Started propranolol  on 06/19/2022  Substance use uses alcohol once or twice a week. Denies any othe substance  Past Medical History:  Past Medical History:  Diagnosis Date   Allergy     Anemia    iron supp   Anxiety    COVID 05/2020   Depression    Fibroids    GERD (gastroesophageal reflux disease)    omeprazole   Headache(784.0)    Hypertension 2012   Maxide   S/P LEEP 1992   Urticaria     Past Surgical History:  Procedure Laterality Date   ABDOMINAL HYSTERECTOMY  2014   Inicision and drainage of thrombosed hemorrhoid  04/06/2018   LEEP  1998   Abnormal pap   SUPRACERVICAL ABDOMINAL HYSTERECTOMY  01/18/2012   Procedure: HYSTERECTOMY SUPRACERVICAL ABDOMINAL with LSO;  Surgeon: Gloris DELENA Hugger,  MD;  Location: [facility];  Service: Gynecology;  Laterality: N/A;   TUBAL LIGATION  1997   WISDOM TOOTH EXTRACTION      Family History:  Family History  Problem Relation Age of Onset   Hypertension Mother    Arthritis Mother    Pulmonary fibrosis Mother    Colon polyps Father    Hypertension Father    Depression Father    Diabetes Father    Alcohol abuse Father    Colonic polyp Father    Diabetes Sister    Congestive Heart Failure Sister    Anxiety disorder Sister    Hearing loss Sister    Hypertension Sister    Arthritis Sister    Diabetes Brother    Breast cancer Paternal Grandmother    Heart attack Paternal Grandmother    Breast cancer Daughter 74   Cancer Daughter    Breast cancer Cousin        1st cousin, paternal   Allergic rhinitis Neg Hx    Angioedema Neg Hx    Asthma Neg Hx    Eczema Neg Hx    Immunodeficiency Neg Hx    Urticaria Neg Hx    Colon cancer Neg Hx    Esophageal cancer Neg Hx    Rectal cancer Neg Hx    Stomach cancer Neg Hx     Social History:  Social History   Socioeconomic History   Marital status: Married    Spouse name: Not on file   Number of children: 2   Years of education: 14   Highest education level: Not on file  Occupational History   Occupation: MEDICAL ASSISTANT    Employer: [employer]  Tobacco Use   Smoking status: Never   Smokeless tobacco: Never  Vaping Use   Vaping status: Never Used  Substance and Sexual Activity   Alcohol use: Yes    Alcohol/week: 4.0 standard drinks of alcohol    Types: 3 Glasses of wine, 1 Standard drinks or equivalent per week    Comment: socially   Drug use: No   Sexual activity: Yes    Birth control/protection: Surgical    Comment: Hysterectomy  Other Topics Concern   Not on file  Social History Narrative   Regular exercise-no   Caffeine  Use-yes   Social Drivers of Health   Tobacco Use: Low Risk (04/25/2024)   Patient History    Smoking Tobacco Use: Never    Smokeless  Tobacco Use: Never    Passive Exposure: Not on file  Financial Resource Strain: Low Risk (09/01/2023)   Overall Financial Resource Strain (CARDIA)    Difficulty of Paying Living Expenses: Not hard at all  Food Insecurity: Not on file  Transportation Needs: Not on file  Physical Activity: Insufficiently Active (09/01/2023)   Exercise Vital Sign    Days of Exercise per Week: 2 days    Minutes of Exercise per Session: 30 min  Stress: No Stress Concern Present (09/01/2023)   [employer] of Occupational Health - Occupational Stress Questionnaire    Feeling of Stress :  Not at all  Social Connections: Moderately Integrated (09/01/2023)   Social Connection and Isolation Panel    Frequency of Communication with Friends and Family: Twice a week    Frequency of Social Gatherings with Friends and Family: Once a week    Attends Religious Services: More than 4 times per year    Active Member of Clubs or Organizations: No    Attends Banker Meetings: Never    Marital Status: Married  Depression (PHQ2-9): Low Risk (03/09/2024)   Depression (PHQ2-9)    PHQ-2 Score: 0  Alcohol Screen: Low Risk (09/01/2023)   Alcohol Screen    Last Alcohol Screening Score (AUDIT): 1  Housing: Not on file  Utilities: Not on file  Health Literacy: Adequate Health Literacy (09/01/2023)   B1300 Health Literacy    Frequency of need for help with medical instructions: Never    Allergies:  Allergies  Allergen Reactions   Shellfish Allergy  Itching and Swelling    Swollen tongue, itching   Tylox [Oxycodone -Acetaminophen ] Itching   Tyloxapol Other (See Comments)    Current Medications: Current Outpatient Medications  Medication Sig Dispense Refill   amLODipine  (NORVASC ) 10 MG tablet Take 1 tablet (10 mg total) by mouth every evening. 90 tablet 3   atorvastatin  (LIPITOR) 20 MG tablet Take 1 tablet (20 mg total) by mouth daily. 90 tablet 3   clobetasol ointment (TEMOVATE) 0.05 % Apply 1 Application  topically in the morning and at bedtime.     diphenhydrAMINE  (BENADRYL ) 25 MG tablet Take 25 mg by mouth as needed.     EPINEPHrine  0.3 mg/0.3 mL IJ SOAJ injection Inject 0.3 mg into the muscle as needed for anaphylaxis. 2 each 1   estradiol  (VIVELLE -DOT) 0.075 MG/24HR Place 1 patch onto the skin 2 (two) times a week. 24 patch 3   fluticasone  (FLONASE ) 50 MCG/ACT nasal spray SHAKE LIQUID AND USE 1 SPRAY IN EACH NOSTRIL DAILY 16 g 5   loratadine (CLARITIN) 10 MG tablet Take 10 mg by mouth daily.     losartan  (COZAAR ) 25 MG tablet TAKE 1 TABLET(25 MG) BY MOUTH DAILY 90 tablet 3   meloxicam  (MOBIC ) 15 MG tablet Take 15 mg by mouth daily as needed.     omeprazole  (PRILOSEC ) 20 MG capsule TAKE 1 CAPSULE(20 MG) BY MOUTH DAILY 90 capsule 1   propranolol  (INDERAL ) 10 MG tablet Take 1 tablet (10 mg total) by mouth 3 (three) times daily as needed. 270 tablet 1   SUMAtriptan  (IMITREX ) 25 MG tablet Take 1-2 tablets (25-50 mg total) by mouth every 2 (two) hours as needed for migraine. May repeat in 2 hours if headache persists or recurs. No more than 100mg  in 24hrs 10 tablet 0   Vitamin D , Ergocalciferol , 2000 units CAPS Take 1 capsule by mouth daily after breakfast. (Patient not taking: Reported on 03/09/2024) 90 capsule 1   No current facility-administered medications for this visit.     Psychiatric Specialty Exam: Review of Systems  Last menstrual period 01/18/2012.There is no height or weight on file to calculate BMI.  General Appearance: Well Groomed  Eye Contact:  Good  Speech:  Clear and Coherent and Normal Rate  Volume:  Normal  Mood:  Euthymic  Affect:  Appropriate  Thought Process:  Coherent, Goal Directed, and Linear  Orientation:  Full (Time, Place, and Person)  Thought Content: Logical   Suicidal Thoughts:  No  Homicidal Thoughts:  No  Memory:  Immediate;   Good  Judgement:  Good  Insight:  Fair  Psychomotor Activity:  Normal  Concentration:  Concentration: Good  Recall:  Good   Fund of Knowledge: Good  Language: Good  Akathisia:  NA    AIMS (if indicated): not done  Assets:  Communication Skills Desire for Improvement Financial Resources/Insurance Housing Resilience Social Support Talents/Skills Transportation Vocational/Educational  ADL's:  Intact  Cognition: WNL  Sleep:  Good   Metabolic Disorder Labs: Lab Results  Component Value Date   HGBA1C 6.4 02/04/2024   MPG 114 07/28/2016   No results found for: PROLACTIN Lab Results  Component Value Date   CHOL 152 02/04/2024   TRIG 67.0 02/04/2024   HDL 58.00 02/04/2024   CHOLHDL 3 02/04/2024   VLDL 13.4 02/04/2024   LDLCALC 80 02/04/2024   LDLCALC 143 (H) 07/27/2023   Lab Results  Component Value Date   TSH 0.42 04/11/2024   TSH 0.301 (L) 10/21/2023    Therapeutic Level Labs: No results found for: LITHIUM No results found for: VALPROATE No results found for: CBMZ   Screenings: GAD-7    Flowsheet Row Office Visit from 09/01/2023 in [facility] Visit from 06/19/2022 in [facility] Office Visit from 08/18/2021 in [facility] [employer] [facility] Visit from 12/20/2020 in [facility] Visit from 07/27/2017 in [facility] [place] [facility] [employer]  Total GAD-7 Score 0 0 0 4 4   PHQ2-9    Flowsheet Row Office Visit from 03/09/2024 in [facility] [place] [facility] Visit from 09/01/2023 in [facility] Visit from 06/10/2023 in [facility] [place] [facility] [employer] [facility] Visit from 04/01/2023 in [facility] [place] [facility] [employer] [facility] Visit from 03/03/2023 in [facility] [place] [facility] [employer]  PHQ-2 Total Score 0 0 0 0 0    Collaboration of Care: Collaboration of Care: Medication Management AEB  medication prescription and Other provider involved in patient's care AEB PCP chart review  Patient/Guardian was advised Release of Information must be obtained prior to any record release in order to collaborate their care with an outside provider. Patient/Guardian was advised if they have not already done so to contact the registration department to sign all necessary forms in order for us  to release information regarding their care.   Consent: Patient/Guardian gives verbal consent for treatment and assignment of benefits for services provided during this visit. Patient/Guardian expressed understanding and agreed to proceed.    Abigail CHRISTELLA Finder, MD 04/25/2024, 3:50 PM   Virtual Visit via Video Note  I connected with Abigail [name] on 04/25/2024 at  3:30 PM EST by a video enabled telemedicine application and verified that I am speaking with the correct person using two identifiers.  Location: Patient: Home Provider: Home Office   I discussed the limitations of evaluation and management by telemedicine and the availability of in person appointments. The patient expressed understanding and agreed to proceed.   I discussed the assessment and treatment plan with the patient. The patient was provided an opportunity to ask questions and all were answered. The patient agreed with the plan and demonstrated an understanding of the instructions.   The patient was advised to call back or seek an in-person evaluation if the symptoms worsen or if the condition fails to improve as anticipated.  I provided 15 minutes of non-face-to-face time during this encounter.   Abigail CHRISTELLA Finder, MD

## 2024-04-25 ENCOUNTER — Encounter (HOSPITAL_COMMUNITY): Payer: Self-pay | Admitting: Psychiatry

## 2024-04-25 ENCOUNTER — Telehealth (HOSPITAL_COMMUNITY): Admitting: Psychiatry

## 2024-04-25 DIAGNOSIS — F410 Panic disorder [episodic paroxysmal anxiety] without agoraphobia: Secondary | ICD-10-CM

## 2024-04-25 MED ORDER — PROPRANOLOL HCL 10 MG PO TABS: 10.0000 mg | ORAL_TABLET | Freq: Three times a day (TID) | ORAL | 1 refills | Status: AC | PRN

## 2024-05-08 ENCOUNTER — Encounter: Payer: Self-pay | Admitting: Nurse Practitioner

## 2024-05-09 ENCOUNTER — Ambulatory Visit: Admitting: Nurse Practitioner

## 2024-05-09 NOTE — Telephone Encounter (Signed)
 Appointment cancelled

## 2024-06-15 ENCOUNTER — Encounter: Payer: Self-pay | Admitting: Family Medicine

## 2024-06-15 ENCOUNTER — Telehealth: Payer: Self-pay | Admitting: Nurse Practitioner

## 2024-06-15 ENCOUNTER — Ambulatory Visit: Admitting: Family Medicine

## 2024-06-15 VITALS — BP 160/98 | HR 71 | Temp 98.1°F | Ht 65.0 in | Wt 189.4 lb

## 2024-06-15 DIAGNOSIS — I10 Essential (primary) hypertension: Secondary | ICD-10-CM

## 2024-06-15 MED ORDER — LOSARTAN POTASSIUM 50 MG PO TABS: 50.0000 mg | ORAL_TABLET | Freq: Every day | ORAL | 0 refills | Status: AC

## 2024-06-15 NOTE — Progress Notes (Signed)
 "  Established Patient Office Visit   Subjective:  Patient ID: Abigail [name], female    DOB: July 01, 1970  Age: 54 y.o. MRN: [id number]  Chief Complaint  Patient presents with   Medical Management of Chronic Issues    Hypertension    HPI Encounter Diagnoses  Name Primary?   Primary hypertension Yes   2 to 3-week history of elevated blood pressure running in the 140s to 160/90-100 range.  Experience some palpitations.  She has been compliant with her amlodipine  10 mg and losartan  25 mg.  The psychiatrist had given her propranolol  10 mg to use 3 times a day as needed for panic attacks.  She has not been taking it as much because she did not feel as though it is helpful.  Consumes alcohol twice weekly which is typically a glass of wine or a margarita.  Does not smoke or use illicit drugs.  Avoids excess sodium.  Exercise is difficult for her right now secondary to an orthopedic injury.   Review of Systems  Constitutional: Negative.   HENT: Negative.    Eyes:  Negative for blurred vision, discharge and redness.  Respiratory: Negative.    Cardiovascular: Negative.   Gastrointestinal:  Negative for abdominal pain.  Genitourinary: Negative.   Musculoskeletal: Negative.  Negative for myalgias.  Skin:  Negative for rash.  Neurological:  Negative for tingling, loss of consciousness and weakness.  Endo/Heme/Allergies:  Negative for polydipsia.    Current Medications[1]   Objective:     BP (!) 160/98   Pulse 71   Temp 98.1 F (36.7 C)   Ht 5' 5 (1.651 m)   Wt 189 lb 6.4 oz (85.9 kg)   LMP 01/18/2012   SpO2 99%   BMI 31.52 kg/m  BP Readings from Last 3 Encounters:  06/15/24 (!) 160/98  04/11/24 (!) 140/82  03/09/24 132/79   Wt Readings from Last 3 Encounters:  06/15/24 189 lb 6.4 oz (85.9 kg)  04/11/24 186 lb 6.4 oz (84.6 kg)  03/09/24 189 lb 9.6 oz (86 kg)      Physical Exam Constitutional:      General: She is not in acute distress.    Appearance: Normal appearance.  She is not ill-appearing, toxic-appearing or diaphoretic.  HENT:     Head: Normocephalic and atraumatic.     Right Ear: External ear normal.     Left Ear: External ear normal.     Mouth/Throat:     Mouth: Mucous membranes are moist.     Pharynx: Oropharynx is clear. No oropharyngeal exudate or posterior oropharyngeal erythema.  Eyes:     General: No scleral icterus.       Right eye: No discharge.        Left eye: No discharge.     Extraocular Movements: Extraocular movements intact.     Conjunctiva/sclera: Conjunctivae normal.     Pupils: Pupils are equal, round, and reactive to light.  Cardiovascular:     Rate and Rhythm: Normal rate and regular rhythm.  Pulmonary:     Effort: Pulmonary effort is normal. No respiratory distress.     Breath sounds: Normal breath sounds. No wheezing or rales.  Abdominal:     General: Bowel sounds are normal.  Musculoskeletal:     Cervical back: No rigidity or tenderness.  Skin:    General: Skin is warm and dry.  Neurological:     Mental Status: She is alert and oriented to person, place, and time.  Psychiatric:  Mood and Affect: Mood normal.        Behavior: Behavior normal.      No results found for any visits on 06/15/24.    The 10-year ASCVD risk score (Arnett DK, et al., 2019) is: 16.9%    Assessment & Plan:   Primary hypertension -     Losartan  Potassium; Take 1 tablet (50 mg total) by mouth daily.  Dispense: 90 tablet; Refill: 0    Return Increase losartan  to 50 mg daily, use propranolol  also for palpitations, see Roselie 3 weeks, for Follow-up with Roselie in 3 to 4 weeks.  Information was given on managing hypertension.  [name], MD    [1]  Current Outpatient Medications:    amLODipine  (NORVASC ) 10 MG tablet, Take 1 tablet (10 mg total) by mouth every evening., Disp: 90 tablet, Rfl: 3   atorvastatin  (LIPITOR) 20 MG tablet, Take 1 tablet (20 mg total) by mouth daily., Disp: 90 tablet, Rfl: 3    clobetasol ointment (TEMOVATE) 0.05 %, Apply 1 Application topically in the morning and at bedtime., Disp: , Rfl:    diphenhydrAMINE  (BENADRYL ) 25 MG tablet, Take 25 mg by mouth as needed., Disp: , Rfl:    EPINEPHrine  0.3 mg/0.3 mL IJ SOAJ injection, Inject 0.3 mg into the muscle as needed for anaphylaxis., Disp: 2 each, Rfl: 1   estradiol  (VIVELLE -DOT) 0.075 MG/24HR, Place 1 patch onto the skin 2 (two) times a week., Disp: 24 patch, Rfl: 3   fluticasone  (FLONASE ) 50 MCG/ACT nasal spray, SHAKE LIQUID AND USE 1 SPRAY IN EACH NOSTRIL DAILY, Disp: 16 g, Rfl: 5   loratadine (CLARITIN) 10 MG tablet, Take 10 mg by mouth daily., Disp: , Rfl:    losartan  (COZAAR ) 50 MG tablet, Take 1 tablet (50 mg total) by mouth daily., Disp: 90 tablet, Rfl: 0   meloxicam  (MOBIC ) 15 MG tablet, Take 15 mg by mouth daily as needed., Disp: , Rfl:    omeprazole  (PRILOSEC ) 20 MG capsule, TAKE 1 CAPSULE(20 MG) BY MOUTH DAILY, Disp: 90 capsule, Rfl: 1   propranolol  (INDERAL ) 10 MG tablet, Take 1 tablet (10 mg total) by mouth 3 (three) times daily as needed., Disp: 270 tablet, Rfl: 1   SUMAtriptan  (IMITREX ) 25 MG tablet, Take 1-2 tablets (25-50 mg total) by mouth every 2 (two) hours as needed for migraine. May repeat in 2 hours if headache persists or recurs. No more than 100mg  in 24hrs, Disp: 10 tablet, Rfl: 0   Vitamin D , Ergocalciferol , 2000 units CAPS, Take 1 capsule by mouth daily after breakfast. (Patient not taking: Reported on 06/15/2024), Disp: 90 capsule, Rfl: 1  "

## 2024-06-15 NOTE — Telephone Encounter (Signed)
 Pt was seen by dr berneta today and said follow up with pcp in 2 weeks but [facility] do not have any appointment in til April unless override a same day slot

## 2024-07-12 ENCOUNTER — Ambulatory Visit: Admitting: Nurse Practitioner

## 2024-07-26 ENCOUNTER — Ambulatory Visit: Admitting: Nurse Practitioner

## 2024-10-19 ENCOUNTER — Telehealth (HOSPITAL_COMMUNITY): Admitting: Psychiatry
